# Patient Record
Sex: Male | Born: 1950 | Race: White | Hispanic: No | Marital: Married | Smoking: Never smoker
Health system: Southern US, Community
[De-identification: ages and names within clinical notes are randomized; demographics above are authoritative.]

## PROBLEM LIST (undated history)

## (undated) DIAGNOSIS — C61 Malignant neoplasm of prostate: Secondary | ICD-10-CM

## (undated) DIAGNOSIS — I2510 Atherosclerotic heart disease of native coronary artery without angina pectoris: Secondary | ICD-10-CM

## (undated) DIAGNOSIS — E785 Hyperlipidemia, unspecified: Secondary | ICD-10-CM

## (undated) DIAGNOSIS — R42 Dizziness and giddiness: Secondary | ICD-10-CM

## (undated) DIAGNOSIS — I10 Essential (primary) hypertension: Secondary | ICD-10-CM

## (undated) DIAGNOSIS — K219 Gastro-esophageal reflux disease without esophagitis: Secondary | ICD-10-CM

## (undated) DIAGNOSIS — K469 Unspecified abdominal hernia without obstruction or gangrene: Secondary | ICD-10-CM

## (undated) DIAGNOSIS — E039 Hypothyroidism, unspecified: Secondary | ICD-10-CM

## (undated) HISTORY — DX: Unspecified abdominal hernia without obstruction or gangrene: K46.9

## (undated) HISTORY — DX: Atherosclerotic heart disease of native coronary artery without angina pectoris: I25.10

## (undated) HISTORY — DX: Dizziness and giddiness: R42

## (undated) HISTORY — DX: Hyperlipidemia, unspecified: E78.5

## (undated) HISTORY — PX: CATARACT EXTRACTION: SUR2

## (undated) HISTORY — DX: Hypothyroidism, unspecified: E03.9

## (undated) HISTORY — DX: Essential (primary) hypertension: I10

## (undated) HISTORY — DX: Malignant neoplasm of prostate: C61

---

## 1998-06-28 ENCOUNTER — Ambulatory Visit (HOSPITAL_COMMUNITY): Admission: RE | Admit: 1998-06-28 | Discharge: 1998-06-28 | Payer: Self-pay | Admitting: Gastroenterology

## 1998-06-28 ENCOUNTER — Encounter (INDEPENDENT_AMBULATORY_CARE_PROVIDER_SITE_OTHER): Payer: Self-pay

## 2002-06-02 ENCOUNTER — Encounter: Payer: Self-pay | Admitting: Gastroenterology

## 2002-06-02 ENCOUNTER — Encounter: Admission: RE | Admit: 2002-06-02 | Discharge: 2002-06-02 | Payer: Self-pay | Admitting: Gastroenterology

## 2004-01-10 HISTORY — PX: PROSTATE SURGERY: SHX751

## 2004-01-20 ENCOUNTER — Ambulatory Visit (HOSPITAL_COMMUNITY): Admission: RE | Admit: 2004-01-20 | Discharge: 2004-01-20 | Payer: Self-pay | Admitting: Gastroenterology

## 2009-01-09 DIAGNOSIS — I2510 Atherosclerotic heart disease of native coronary artery without angina pectoris: Secondary | ICD-10-CM

## 2009-01-09 HISTORY — DX: Atherosclerotic heart disease of native coronary artery without angina pectoris: I25.10

## 2009-01-09 HISTORY — PX: CORONARY ARTERY BYPASS GRAFT: SHX141

## 2009-01-21 ENCOUNTER — Ambulatory Visit: Payer: Self-pay | Admitting: Surgery

## 2009-01-21 ENCOUNTER — Inpatient Hospital Stay (HOSPITAL_COMMUNITY): Admission: RE | Admit: 2009-01-21 | Discharge: 2009-01-27 | Payer: Self-pay | Admitting: Cardiology

## 2009-01-21 ENCOUNTER — Encounter: Payer: Self-pay | Admitting: Surgery

## 2009-02-16 ENCOUNTER — Ambulatory Visit: Payer: Self-pay | Admitting: Surgery

## 2009-02-16 ENCOUNTER — Encounter: Admission: RE | Admit: 2009-02-16 | Discharge: 2009-02-16 | Payer: Self-pay | Admitting: Surgery

## 2009-02-18 ENCOUNTER — Encounter (HOSPITAL_COMMUNITY): Admission: RE | Admit: 2009-02-18 | Discharge: 2009-05-19 | Payer: Self-pay | Admitting: Cardiology

## 2009-03-18 ENCOUNTER — Ambulatory Visit: Payer: Self-pay | Admitting: Cardiothoracic Surgery

## 2009-05-20 ENCOUNTER — Encounter (HOSPITAL_COMMUNITY): Admission: RE | Admit: 2009-05-20 | Discharge: 2009-05-28 | Payer: Self-pay | Admitting: Cardiology

## 2009-06-03 ENCOUNTER — Encounter (HOSPITAL_COMMUNITY): Admission: RE | Admit: 2009-06-03 | Discharge: 2009-09-01 | Payer: Self-pay | Admitting: Cardiology

## 2009-08-26 ENCOUNTER — Ambulatory Visit: Payer: Self-pay | Admitting: Cardiology

## 2009-08-26 ENCOUNTER — Observation Stay (HOSPITAL_COMMUNITY): Admission: EM | Admit: 2009-08-26 | Discharge: 2009-08-26 | Payer: Self-pay | Admitting: Emergency Medicine

## 2009-09-09 ENCOUNTER — Encounter (HOSPITAL_COMMUNITY)
Admission: RE | Admit: 2009-09-09 | Discharge: 2009-12-08 | Payer: Self-pay | Source: Home / Self Care | Admitting: Cardiology

## 2009-12-09 ENCOUNTER — Encounter (HOSPITAL_COMMUNITY)
Admission: RE | Admit: 2009-12-09 | Discharge: 2010-02-08 | Payer: Self-pay | Source: Home / Self Care | Attending: Cardiology | Admitting: Cardiology

## 2010-02-10 ENCOUNTER — Encounter (HOSPITAL_COMMUNITY): Payer: 59 | Attending: Cardiology

## 2010-02-10 DIAGNOSIS — K219 Gastro-esophageal reflux disease without esophagitis: Secondary | ICD-10-CM | POA: Insufficient documentation

## 2010-02-10 DIAGNOSIS — I2510 Atherosclerotic heart disease of native coronary artery without angina pectoris: Secondary | ICD-10-CM | POA: Insufficient documentation

## 2010-02-10 DIAGNOSIS — R002 Palpitations: Secondary | ICD-10-CM | POA: Insufficient documentation

## 2010-02-10 DIAGNOSIS — I10 Essential (primary) hypertension: Secondary | ICD-10-CM | POA: Insufficient documentation

## 2010-02-10 DIAGNOSIS — Z8249 Family history of ischemic heart disease and other diseases of the circulatory system: Secondary | ICD-10-CM | POA: Insufficient documentation

## 2010-02-10 DIAGNOSIS — Z5189 Encounter for other specified aftercare: Secondary | ICD-10-CM | POA: Insufficient documentation

## 2010-02-10 DIAGNOSIS — E039 Hypothyroidism, unspecified: Secondary | ICD-10-CM | POA: Insufficient documentation

## 2010-02-10 DIAGNOSIS — Z951 Presence of aortocoronary bypass graft: Secondary | ICD-10-CM | POA: Insufficient documentation

## 2010-02-10 DIAGNOSIS — Z8546 Personal history of malignant neoplasm of prostate: Secondary | ICD-10-CM | POA: Insufficient documentation

## 2010-02-10 DIAGNOSIS — E785 Hyperlipidemia, unspecified: Secondary | ICD-10-CM | POA: Insufficient documentation

## 2010-02-14 ENCOUNTER — Encounter (HOSPITAL_COMMUNITY): Payer: 59

## 2010-02-15 ENCOUNTER — Encounter (HOSPITAL_COMMUNITY): Payer: 59

## 2010-02-17 ENCOUNTER — Encounter (HOSPITAL_COMMUNITY): Payer: 59

## 2010-02-21 ENCOUNTER — Encounter (HOSPITAL_COMMUNITY): Payer: 59

## 2010-02-22 ENCOUNTER — Encounter (HOSPITAL_COMMUNITY): Payer: 59

## 2010-02-23 ENCOUNTER — Encounter (HOSPITAL_COMMUNITY): Payer: 59

## 2010-02-24 ENCOUNTER — Encounter (HOSPITAL_COMMUNITY): Payer: 59

## 2010-02-28 ENCOUNTER — Encounter (HOSPITAL_COMMUNITY): Payer: 59

## 2010-03-01 ENCOUNTER — Encounter (HOSPITAL_COMMUNITY): Payer: 59

## 2010-03-02 ENCOUNTER — Encounter (HOSPITAL_COMMUNITY): Payer: 59 | Attending: Cardiology

## 2010-03-03 ENCOUNTER — Encounter (HOSPITAL_COMMUNITY): Payer: 59

## 2010-03-07 ENCOUNTER — Encounter (HOSPITAL_COMMUNITY): Payer: 59

## 2010-03-08 ENCOUNTER — Encounter (HOSPITAL_COMMUNITY): Payer: 59

## 2010-03-10 ENCOUNTER — Encounter (HOSPITAL_COMMUNITY): Payer: 59 | Attending: Cardiology

## 2010-03-10 DIAGNOSIS — R002 Palpitations: Secondary | ICD-10-CM | POA: Insufficient documentation

## 2010-03-10 DIAGNOSIS — Z8249 Family history of ischemic heart disease and other diseases of the circulatory system: Secondary | ICD-10-CM | POA: Insufficient documentation

## 2010-03-10 DIAGNOSIS — E785 Hyperlipidemia, unspecified: Secondary | ICD-10-CM | POA: Insufficient documentation

## 2010-03-10 DIAGNOSIS — I10 Essential (primary) hypertension: Secondary | ICD-10-CM | POA: Insufficient documentation

## 2010-03-10 DIAGNOSIS — I2510 Atherosclerotic heart disease of native coronary artery without angina pectoris: Secondary | ICD-10-CM | POA: Insufficient documentation

## 2010-03-10 DIAGNOSIS — K219 Gastro-esophageal reflux disease without esophagitis: Secondary | ICD-10-CM | POA: Insufficient documentation

## 2010-03-10 DIAGNOSIS — Z8546 Personal history of malignant neoplasm of prostate: Secondary | ICD-10-CM | POA: Insufficient documentation

## 2010-03-10 DIAGNOSIS — Z5189 Encounter for other specified aftercare: Secondary | ICD-10-CM | POA: Insufficient documentation

## 2010-03-10 DIAGNOSIS — E039 Hypothyroidism, unspecified: Secondary | ICD-10-CM | POA: Insufficient documentation

## 2010-03-10 DIAGNOSIS — Z951 Presence of aortocoronary bypass graft: Secondary | ICD-10-CM | POA: Insufficient documentation

## 2010-03-14 ENCOUNTER — Encounter (HOSPITAL_COMMUNITY): Payer: 59

## 2010-03-15 ENCOUNTER — Ambulatory Visit (INDEPENDENT_AMBULATORY_CARE_PROVIDER_SITE_OTHER): Payer: 59 | Admitting: Surgery

## 2010-03-15 ENCOUNTER — Encounter (HOSPITAL_COMMUNITY): Payer: 59

## 2010-03-15 DIAGNOSIS — I2510 Atherosclerotic heart disease of native coronary artery without angina pectoris: Secondary | ICD-10-CM

## 2010-03-17 ENCOUNTER — Encounter (HOSPITAL_COMMUNITY): Payer: 59

## 2010-03-17 NOTE — Assessment & Plan Note (Signed)
OFFICE VISIT  ZOLTAN, GENEST DOB:  [birth year]                                        March 17, 2010 CHART #:  [id number]  The patient returned to my office today for examination of his chest incision status post coronary artery bypass graft surgery x2 on January 22, 2009.  He reports having 2 areas in the sternotomy incision.  They appeared discolored and he and his wife were concerned that there may be a retained suture.  He has had no fever or chills.  Otherwise, he has been doing well at home.  PHYSICAL EXAMINATION:  The chest incision is well healed.  The sternum is stable.  There are 2 darkened areas in the lower portion of the chest incision, which look like large blackheads.  These were probed with a pair of forceps and a large piece of dark sebaceous material was removed confirming that these were large pits in the skin with blackhead formation.  There is no sign of retained suture and I would not expect there would be since the incisions were closed with absorbable Vicryl suture.  IMPRESSION:  The patient does not have retained sutures but has 2 large blackheads in the incision.  This material was removed.  I instructed him to keep this area as clean as possible with soap and water.  This material may recur and he may need to manually remove it with a pair of tweezers if it does.  He understands all of this and is in agreement with that plan.  INDICATIONS:  This patient  Evelene Croon, M.D. Electronically Signed  BB/MEDQ  D:  03/17/2010  T:  03/17/2010  Job:  [id number]

## 2010-03-21 ENCOUNTER — Encounter (HOSPITAL_COMMUNITY): Payer: 59

## 2010-03-22 ENCOUNTER — Encounter (HOSPITAL_COMMUNITY): Payer: 59

## 2010-03-24 ENCOUNTER — Encounter (HOSPITAL_COMMUNITY): Payer: 59

## 2010-03-25 LAB — URINALYSIS, ROUTINE W REFLEX MICROSCOPIC
Glucose, UA: NEGATIVE mg/dL
Hgb urine dipstick: NEGATIVE
Nitrite: NEGATIVE
Protein, ur: NEGATIVE mg/dL
Urobilinogen, UA: 0.2 mg/dL (ref 0.0–1.0)

## 2010-03-25 LAB — CBC
Hemoglobin: 14.6 g/dL (ref 13.0–17.0)
MCH: 31.5 pg (ref 26.0–34.0)
RBC: 4.63 MIL/uL (ref 4.22–5.81)
WBC: 7.5 10*3/uL (ref 4.0–10.5)

## 2010-03-25 LAB — BASIC METABOLIC PANEL
Calcium: 9.4 mg/dL (ref 8.4–10.5)
Chloride: 110 mEq/L (ref 96–112)
Creatinine, Ser: 1.39 mg/dL (ref 0.4–1.5)
Sodium: 142 mEq/L (ref 135–145)

## 2010-03-25 LAB — POCT CARDIAC MARKERS

## 2010-03-25 LAB — DIFFERENTIAL
Eosinophils Relative: 1 % (ref 0–5)
Lymphocytes Relative: 46 % (ref 12–46)
Monocytes Absolute: 0.6 10*3/uL (ref 0.1–1.0)
Neutro Abs: 3.3 10*3/uL (ref 1.7–7.7)

## 2010-03-25 LAB — CK TOTAL AND CKMB (NOT AT ARMC): Total CK: 103 U/L (ref 7–232)

## 2010-03-25 LAB — TSH: TSH: 5.118 u[IU]/mL — ABNORMAL HIGH (ref 0.350–4.500)

## 2010-03-27 LAB — POCT I-STAT 4, (NA,K, GLUC, HGB,HCT)
Glucose, Bld: 105 mg/dL — ABNORMAL HIGH (ref 70–99)
Glucose, Bld: 98 mg/dL (ref 70–99)
HCT: 38 % — ABNORMAL LOW (ref 39.0–52.0)
Hemoglobin: 10.5 g/dL — ABNORMAL LOW (ref 13.0–17.0)
Hemoglobin: 12.9 g/dL — ABNORMAL LOW (ref 13.0–17.0)
Hemoglobin: 8.8 g/dL — ABNORMAL LOW (ref 13.0–17.0)
Potassium: 4.1 mEq/L (ref 3.5–5.1)
Potassium: 4.2 mEq/L (ref 3.5–5.1)
Sodium: 133 mEq/L — ABNORMAL LOW (ref 135–145)
Sodium: 138 mEq/L (ref 135–145)
Sodium: 139 mEq/L (ref 135–145)

## 2010-03-27 LAB — POCT I-STAT 3, VENOUS BLOOD GAS (G3P V)
Acid-base deficit: 2 mmol/L (ref 0.0–2.0)
Bicarbonate: 23.3 mEq/L (ref 20.0–24.0)
O2 Saturation: 76 %
pCO2, Ven: 42.2 mmHg — ABNORMAL LOW (ref 45.0–50.0)
pH, Ven: 7.35 — ABNORMAL HIGH (ref 7.250–7.300)

## 2010-03-27 LAB — POCT I-STAT 3, ART BLOOD GAS (G3+)
Bicarbonate: 21.3 mEq/L (ref 20.0–24.0)
Bicarbonate: 24.8 mEq/L — ABNORMAL HIGH (ref 20.0–24.0)
O2 Saturation: 100 %
O2 Saturation: 100 %
O2 Saturation: 99 %
Patient temperature: 34.9
TCO2: 26 mmol/L (ref 0–100)
pCO2 arterial: 36.5 mmHg (ref 35.0–45.0)
pCO2 arterial: 39 mmHg (ref 35.0–45.0)
pCO2 arterial: 40.1 mmHg (ref 35.0–45.0)
pH, Arterial: 7.341 — ABNORMAL LOW (ref 7.350–7.450)
pH, Arterial: 7.364 (ref 7.350–7.450)
pO2, Arterial: 253 mmHg — ABNORMAL HIGH (ref 80.0–100.0)
pO2, Arterial: 289 mmHg — ABNORMAL HIGH (ref 80.0–100.0)
pO2, Arterial: 81 mmHg (ref 80.0–100.0)

## 2010-03-27 LAB — BASIC METABOLIC PANEL
Calcium: 9 mg/dL (ref 8.4–10.5)
Chloride: 103 mEq/L (ref 96–112)
Chloride: 106 mEq/L (ref 96–112)
GFR calc Af Amer: >60 mL/min (ref >60)
GFR calc Af Amer: >60 mL/min (ref >60)
GFR calc non Af Amer: >60 mL/min (ref >60)
Potassium: 3.8 mEq/L (ref 3.5–5.1)
Potassium: 4.6 mEq/L (ref 3.5–5.1)
Sodium: 136 mEq/L (ref 135–145)

## 2010-03-27 LAB — PROTIME-INR
INR: 1.08 (ref 0.00–1.49)
INR: 1.47 (ref 0.00–1.49)
Prothrombin Time: 13.9 seconds (ref 11.6–15.2)
Prothrombin Time: 17.7 seconds — ABNORMAL HIGH (ref 11.6–15.2)

## 2010-03-27 LAB — CBC
HCT: 33.9 % — ABNORMAL LOW (ref 39.0–52.0)
HCT: 35.7 % — ABNORMAL LOW (ref 39.0–52.0)
HCT: 43.9 % (ref 39.0–52.0)
HCT: 44 % (ref 39.0–52.0)
Hemoglobin: 15.1 g/dL (ref 13.0–17.0)
MCHC: 34.7 g/dL (ref 30.0–36.0)
MCHC: 35 g/dL (ref 30.0–36.0)
MCV: 95 fL (ref 78.0–100.0)
MCV: 95.3 fL (ref 78.0–100.0)
MCV: 95.4 fL (ref 78.0–100.0)
MCV: 95.7 fL (ref 78.0–100.0)
MCV: 96 fL (ref 78.0–100.0)
Platelets: 124 10*3/uL — ABNORMAL LOW (ref 150–400)
Platelets: 169 10*3/uL (ref 150–400)
RBC: 3.56 MIL/uL — ABNORMAL LOW (ref 4.22–5.81)
RBC: 3.71 MIL/uL — ABNORMAL LOW (ref 4.22–5.81)
RBC: 4.59 MIL/uL (ref 4.22–5.81)
RBC: 4.62 MIL/uL (ref 4.22–5.81)
RDW: 12.7 % (ref 11.5–15.5)
WBC: 12.7 10*3/uL — ABNORMAL HIGH (ref 4.0–10.5)
WBC: 14.2 10*3/uL — ABNORMAL HIGH (ref 4.0–10.5)
WBC: 15.7 10*3/uL — ABNORMAL HIGH (ref 4.0–10.5)
WBC: 9.4 10*3/uL (ref 4.0–10.5)

## 2010-03-27 LAB — COMPREHENSIVE METABOLIC PANEL
BUN: 19 mg/dL (ref 6–23)
CO2: 28 mEq/L (ref 19–32)
Calcium: 8.7 mg/dL (ref 8.4–10.5)
Chloride: 101 mEq/L (ref 96–112)
GFR calc Af Amer: >60 mL/min (ref >60)
Glucose, Bld: 132 mg/dL — ABNORMAL HIGH (ref 70–99)
Total Bilirubin: 0.7 mg/dL (ref 0.3–1.2)
Total Protein: 6.9 g/dL (ref 6.0–8.3)

## 2010-03-27 LAB — GLUCOSE, CAPILLARY
Glucose-Capillary: 106 mg/dL — ABNORMAL HIGH (ref 70–99)
Glucose-Capillary: 88 mg/dL (ref 70–99)

## 2010-03-27 LAB — POCT I-STAT, CHEM 8
BUN: 17 mg/dL (ref 6–23)
BUN: 18 mg/dL (ref 6–23)
Calcium, Ion: 1.08 mmol/L — ABNORMAL LOW (ref 1.12–1.32)
Chloride: 105 mEq/L (ref 96–112)
Creatinine, Ser: 1.1 mg/dL (ref 0.4–1.5)
HCT: 35 % — ABNORMAL LOW (ref 39.0–52.0)
Sodium: 137 mEq/L (ref 135–145)
TCO2: 24 mmol/L (ref 0–100)
TCO2: 26 mmol/L (ref 0–100)

## 2010-03-27 LAB — TYPE AND SCREEN

## 2010-03-27 LAB — HEMOGLOBIN AND HEMATOCRIT, BLOOD: HCT: 29.5 % — ABNORMAL LOW (ref 39.0–52.0)

## 2010-03-27 LAB — LIPID PANEL
Cholesterol: 174 mg/dL (ref 0–200)
VLDL: 25 mg/dL (ref 0–40)

## 2010-03-27 LAB — CREATININE, SERUM
GFR calc Af Amer: >60 mL/min (ref >60)
GFR calc Af Amer: >60 mL/min (ref >60)

## 2010-03-27 LAB — URINALYSIS, ROUTINE W REFLEX MICROSCOPIC
Bilirubin Urine: NEGATIVE
Glucose, UA: NEGATIVE mg/dL
Hgb urine dipstick: NEGATIVE
Ketones, ur: NEGATIVE mg/dL
Protein, ur: NEGATIVE mg/dL
Specific Gravity, Urine: 1.02 (ref 1.005–1.030)
Urobilinogen, UA: 0.2 mg/dL (ref 0.0–1.0)

## 2010-03-27 LAB — APTT: aPTT: 27 seconds (ref 24–37)

## 2010-03-27 LAB — MAGNESIUM
Magnesium: 2.5 mg/dL (ref 1.5–2.5)
Magnesium: 2.8 mg/dL — ABNORMAL HIGH (ref 1.5–2.5)

## 2010-03-27 LAB — BLOOD GAS, ARTERIAL
Acid-Base Excess: 1.5 mmol/L (ref 0.0–2.0)
Bicarbonate: 25.5 mEq/L — ABNORMAL HIGH (ref 20.0–24.0)
TCO2: 26.7 mmol/L (ref 0–100)
pCO2 arterial: 39.3 mmHg (ref 35.0–45.0)

## 2010-03-28 ENCOUNTER — Encounter (HOSPITAL_COMMUNITY): Payer: 59

## 2010-03-28 LAB — CBC
HCT: 30.9 % — ABNORMAL LOW (ref 39.0–52.0)
Hemoglobin: 10.8 g/dL — ABNORMAL LOW (ref 13.0–17.0)
Hemoglobin: 11.2 g/dL — ABNORMAL LOW (ref 13.0–17.0)
MCHC: 34.8 g/dL (ref 30.0–36.0)
MCHC: 34.8 g/dL (ref 30.0–36.0)
MCV: 95.4 fL (ref 78.0–100.0)
RBC: 3.24 MIL/uL — ABNORMAL LOW (ref 4.22–5.81)
RBC: 3.36 MIL/uL — ABNORMAL LOW (ref 4.22–5.81)
WBC: 14.6 10*3/uL — ABNORMAL HIGH (ref 4.0–10.5)

## 2010-03-28 LAB — URINE MICROSCOPIC-ADD ON

## 2010-03-28 LAB — URINALYSIS, ROUTINE W REFLEX MICROSCOPIC
Bilirubin Urine: NEGATIVE
Glucose, UA: NEGATIVE mg/dL
Hgb urine dipstick: NEGATIVE
Ketones, ur: 15 mg/dL — AB
Protein, ur: 30 mg/dL — AB
pH: 7.5 (ref 5.0–8.0)

## 2010-03-28 LAB — BASIC METABOLIC PANEL
CO2: 27 mEq/L (ref 19–32)
Calcium: 7.5 mg/dL — ABNORMAL LOW (ref 8.4–10.5)
GFR calc Af Amer: >60 mL/min (ref >60)
Sodium: 133 mEq/L — ABNORMAL LOW (ref 135–145)

## 2010-03-29 ENCOUNTER — Encounter (HOSPITAL_COMMUNITY): Payer: 59

## 2010-03-31 ENCOUNTER — Encounter (HOSPITAL_COMMUNITY): Payer: 59

## 2010-04-04 ENCOUNTER — Encounter (HOSPITAL_COMMUNITY): Payer: 59

## 2010-04-05 ENCOUNTER — Encounter (HOSPITAL_COMMUNITY): Payer: 59

## 2010-04-07 ENCOUNTER — Encounter (HOSPITAL_COMMUNITY): Payer: 59

## 2010-04-11 ENCOUNTER — Encounter (HOSPITAL_COMMUNITY): Payer: Self-pay | Attending: Cardiology

## 2010-04-11 DIAGNOSIS — Z8546 Personal history of malignant neoplasm of prostate: Secondary | ICD-10-CM | POA: Insufficient documentation

## 2010-04-11 DIAGNOSIS — Z5189 Encounter for other specified aftercare: Secondary | ICD-10-CM | POA: Insufficient documentation

## 2010-04-11 DIAGNOSIS — E039 Hypothyroidism, unspecified: Secondary | ICD-10-CM | POA: Insufficient documentation

## 2010-04-11 DIAGNOSIS — Z951 Presence of aortocoronary bypass graft: Secondary | ICD-10-CM | POA: Insufficient documentation

## 2010-04-11 DIAGNOSIS — I2510 Atherosclerotic heart disease of native coronary artery without angina pectoris: Secondary | ICD-10-CM | POA: Insufficient documentation

## 2010-04-11 DIAGNOSIS — E785 Hyperlipidemia, unspecified: Secondary | ICD-10-CM | POA: Insufficient documentation

## 2010-04-11 DIAGNOSIS — Z8249 Family history of ischemic heart disease and other diseases of the circulatory system: Secondary | ICD-10-CM | POA: Insufficient documentation

## 2010-04-11 DIAGNOSIS — R002 Palpitations: Secondary | ICD-10-CM | POA: Insufficient documentation

## 2010-04-11 DIAGNOSIS — K219 Gastro-esophageal reflux disease without esophagitis: Secondary | ICD-10-CM | POA: Insufficient documentation

## 2010-04-11 DIAGNOSIS — I10 Essential (primary) hypertension: Secondary | ICD-10-CM | POA: Insufficient documentation

## 2010-04-12 ENCOUNTER — Encounter (HOSPITAL_COMMUNITY): Payer: Self-pay

## 2010-04-14 ENCOUNTER — Encounter (HOSPITAL_COMMUNITY): Payer: Self-pay

## 2010-04-18 ENCOUNTER — Encounter (HOSPITAL_COMMUNITY): Payer: Self-pay

## 2010-04-19 ENCOUNTER — Encounter (HOSPITAL_COMMUNITY): Payer: Self-pay

## 2010-04-21 ENCOUNTER — Encounter (HOSPITAL_COMMUNITY): Payer: Self-pay

## 2010-04-25 ENCOUNTER — Encounter (HOSPITAL_COMMUNITY): Payer: Self-pay

## 2010-04-26 ENCOUNTER — Encounter (HOSPITAL_COMMUNITY): Payer: Self-pay

## 2010-04-28 ENCOUNTER — Encounter (HOSPITAL_COMMUNITY): Payer: Self-pay

## 2010-05-02 ENCOUNTER — Encounter (HOSPITAL_COMMUNITY): Payer: Self-pay

## 2010-05-03 ENCOUNTER — Encounter (HOSPITAL_COMMUNITY): Payer: Self-pay

## 2010-05-05 ENCOUNTER — Encounter (HOSPITAL_COMMUNITY): Payer: Self-pay

## 2010-05-09 ENCOUNTER — Encounter (HOSPITAL_COMMUNITY): Payer: Self-pay

## 2010-05-10 ENCOUNTER — Encounter (HOSPITAL_COMMUNITY): Payer: Self-pay | Attending: Cardiology

## 2010-05-10 DIAGNOSIS — R002 Palpitations: Secondary | ICD-10-CM | POA: Insufficient documentation

## 2010-05-10 DIAGNOSIS — I10 Essential (primary) hypertension: Secondary | ICD-10-CM | POA: Insufficient documentation

## 2010-05-10 DIAGNOSIS — Z951 Presence of aortocoronary bypass graft: Secondary | ICD-10-CM | POA: Insufficient documentation

## 2010-05-10 DIAGNOSIS — E039 Hypothyroidism, unspecified: Secondary | ICD-10-CM | POA: Insufficient documentation

## 2010-05-10 DIAGNOSIS — E785 Hyperlipidemia, unspecified: Secondary | ICD-10-CM | POA: Insufficient documentation

## 2010-05-10 DIAGNOSIS — Z5189 Encounter for other specified aftercare: Secondary | ICD-10-CM | POA: Insufficient documentation

## 2010-05-10 DIAGNOSIS — Z8249 Family history of ischemic heart disease and other diseases of the circulatory system: Secondary | ICD-10-CM | POA: Insufficient documentation

## 2010-05-10 DIAGNOSIS — I2510 Atherosclerotic heart disease of native coronary artery without angina pectoris: Secondary | ICD-10-CM | POA: Insufficient documentation

## 2010-05-10 DIAGNOSIS — K219 Gastro-esophageal reflux disease without esophagitis: Secondary | ICD-10-CM | POA: Insufficient documentation

## 2010-05-10 DIAGNOSIS — Z8546 Personal history of malignant neoplasm of prostate: Secondary | ICD-10-CM | POA: Insufficient documentation

## 2010-05-12 ENCOUNTER — Encounter (HOSPITAL_COMMUNITY): Payer: Self-pay

## 2010-05-17 ENCOUNTER — Encounter (HOSPITAL_COMMUNITY): Payer: Self-pay

## 2010-05-18 ENCOUNTER — Encounter (HOSPITAL_COMMUNITY): Payer: Self-pay

## 2010-05-19 ENCOUNTER — Encounter (HOSPITAL_COMMUNITY): Payer: Self-pay

## 2010-05-23 ENCOUNTER — Encounter (HOSPITAL_COMMUNITY): Payer: Self-pay

## 2010-05-24 ENCOUNTER — Encounter (HOSPITAL_COMMUNITY): Payer: Self-pay

## 2010-05-24 NOTE — Assessment & Plan Note (Signed)
OFFICE VISIT   KASHMIR, LYSAGHT  DOB:  03-May-1950                                        February 16, 2009  CHART #:  [id number]   The patient returned to my office today for followup status post  coronary bypass graft surgery x2 on January 22, 2009.  He did have some  postoperative atrial fibrillation and went home on amiodarone and  Lopressor.  He said that after discharge he did have problems at home  with dizziness and his amiodarone and Lopressor were cut in half and he  has had no further problems with dizziness since then.  He has been  walking without chest pain or shortness of breath.   On physical examination, his blood pressure is 108/68, pulse is 78 and  regular, and respiratory rate is 18 unlabored.  Oxygen saturation on  room air is 96%.  He looks well.  Cardiac exam shows a regular rate and  rhythm with normal heart sounds.  His lung exam is clear.  Chest  incision is healing well and the sternum is stable.  His right leg  incision is healing well.  There is no lower extremity edema.   Followup chest x-ray today shows clear lung fields and no pleural  effusions.   His medications are aspirin 81 mg twice daily, Lopressor 25 mg b.i.d.,  Crestor 40 mg daily, amiodarone 200 mg daily, Synthroid 25 mcg daily,  and Tylenol p.r.n. pain.   IMPRESSION:  Overall, the patient is recovering well following his  surgery.  I told him he can return to driving a car at this time.  He  should refrain from lifting anything heavier than 10 pounds for a total  of 3 months from date of surgery.  I encouraged him to continue walking  as much as possible.  He has followup appointment in the next month with  Dr. Anne Fu and I think he  can probably get off his amiodarone at that time.  I told him he did not  need to return to see me unless he develops any problems with his  incisions.   Evelene Croon, M.D.  Electronically Signed   BB/MEDQ  D:  02/16/2009  T:   02/17/2009  Job:  [id number]   cc:   Jake Bathe, MD

## 2010-05-25 ENCOUNTER — Encounter (HOSPITAL_COMMUNITY): Payer: Self-pay

## 2010-05-27 NOTE — Op Note (Signed)
NAMEALPHONSE, Collin Morse                ACCOUNT NO.:  [id number]   MEDICAL RECORD NO.:  [id number]          PATIENT TYPE:  AMB   LOCATION:  ENDO                         FACILITY:  [facility]   PHYSICIAN:  John C. Madilyn Fireman, M.D.    DATE OF BIRTH:  May 29, 1950   DATE OF PROCEDURE:  01/20/2004  DATE OF DISCHARGE:                                 OPERATIVE REPORT   INDICATIONS FOR PROCEDURE:  History of adenomatous colon polyps.   PROCEDURE:  The patient was placed in the left lateral decubitus position  and placed on the pulse monitor with continuous low-flow oxygen delivered by  nasal cannula. He was sedated with 75 mcg IV fentanyl and 7 mg IV Versed.  The Olympus video colonoscope was inserted into the rectum and advanced to  the cecum, confirmed by transillumination by McBurney's point and  visualization of the ileocecal valve and appendiceal orifice. The prep was  excellent. The cecum, ascending, transverse, descending and sigmoid colon  all appeared normal with no masses, polyps, diverticula or other mucosal  abnormalities. The rectum likewise appeared normal, and retroflexed view of  the anus revealed no obvious internal hemorrhoids. Scope was then withdrawn  and the patient returned to the recovery room in stable condition.  He  tolerated the procedure well, and there were no immediate complications.   IMPRESSION:  Normal colonoscopy.   PLAN:  Repeat study in 5 years.      JCH/MEDQ  D:  01/20/2004  T:  01/20/2004  Job:  [id number]   cc:   Bertram Millard. Hyacinth Meeker, M.D.  [address]  [place]  [place] [address]  Fax: [phone]

## 2010-05-30 ENCOUNTER — Encounter (HOSPITAL_COMMUNITY): Payer: Self-pay

## 2010-05-31 ENCOUNTER — Encounter (HOSPITAL_COMMUNITY): Payer: Self-pay

## 2010-06-02 ENCOUNTER — Encounter (HOSPITAL_COMMUNITY): Payer: Self-pay

## 2010-06-07 ENCOUNTER — Encounter (HOSPITAL_COMMUNITY): Payer: Self-pay

## 2010-06-08 ENCOUNTER — Encounter (HOSPITAL_COMMUNITY): Payer: Self-pay

## 2010-06-09 ENCOUNTER — Encounter (HOSPITAL_COMMUNITY): Payer: Self-pay

## 2010-06-13 ENCOUNTER — Encounter (HOSPITAL_COMMUNITY): Payer: Self-pay | Attending: Cardiology

## 2010-06-13 DIAGNOSIS — Z8546 Personal history of malignant neoplasm of prostate: Secondary | ICD-10-CM | POA: Insufficient documentation

## 2010-06-13 DIAGNOSIS — Z8249 Family history of ischemic heart disease and other diseases of the circulatory system: Secondary | ICD-10-CM | POA: Insufficient documentation

## 2010-06-13 DIAGNOSIS — Z951 Presence of aortocoronary bypass graft: Secondary | ICD-10-CM | POA: Insufficient documentation

## 2010-06-13 DIAGNOSIS — E039 Hypothyroidism, unspecified: Secondary | ICD-10-CM | POA: Insufficient documentation

## 2010-06-13 DIAGNOSIS — Z5189 Encounter for other specified aftercare: Secondary | ICD-10-CM | POA: Insufficient documentation

## 2010-06-13 DIAGNOSIS — K219 Gastro-esophageal reflux disease without esophagitis: Secondary | ICD-10-CM | POA: Insufficient documentation

## 2010-06-13 DIAGNOSIS — I10 Essential (primary) hypertension: Secondary | ICD-10-CM | POA: Insufficient documentation

## 2010-06-13 DIAGNOSIS — E785 Hyperlipidemia, unspecified: Secondary | ICD-10-CM | POA: Insufficient documentation

## 2010-06-13 DIAGNOSIS — R002 Palpitations: Secondary | ICD-10-CM | POA: Insufficient documentation

## 2010-06-13 DIAGNOSIS — I2510 Atherosclerotic heart disease of native coronary artery without angina pectoris: Secondary | ICD-10-CM | POA: Insufficient documentation

## 2010-06-14 ENCOUNTER — Encounter (HOSPITAL_COMMUNITY): Payer: Self-pay

## 2010-06-16 ENCOUNTER — Encounter (HOSPITAL_COMMUNITY): Payer: Self-pay

## 2010-06-20 ENCOUNTER — Encounter (HOSPITAL_COMMUNITY): Payer: Self-pay

## 2010-06-21 ENCOUNTER — Encounter (HOSPITAL_COMMUNITY): Payer: Self-pay

## 2010-06-23 ENCOUNTER — Encounter (HOSPITAL_COMMUNITY): Payer: Self-pay

## 2010-06-27 ENCOUNTER — Encounter (HOSPITAL_COMMUNITY): Payer: Self-pay

## 2010-06-29 ENCOUNTER — Encounter (HOSPITAL_COMMUNITY): Payer: Self-pay

## 2010-06-30 ENCOUNTER — Encounter (HOSPITAL_COMMUNITY): Payer: Self-pay

## 2010-07-04 ENCOUNTER — Encounter (HOSPITAL_COMMUNITY): Payer: Self-pay

## 2010-07-05 ENCOUNTER — Encounter (HOSPITAL_COMMUNITY): Payer: Self-pay

## 2010-07-06 ENCOUNTER — Encounter (HOSPITAL_COMMUNITY): Payer: Self-pay

## 2010-07-07 ENCOUNTER — Encounter (HOSPITAL_COMMUNITY): Payer: Self-pay

## 2010-07-11 ENCOUNTER — Encounter (HOSPITAL_COMMUNITY): Payer: Self-pay | Attending: Cardiology

## 2010-07-11 DIAGNOSIS — Z8546 Personal history of malignant neoplasm of prostate: Secondary | ICD-10-CM | POA: Insufficient documentation

## 2010-07-11 DIAGNOSIS — I2510 Atherosclerotic heart disease of native coronary artery without angina pectoris: Secondary | ICD-10-CM | POA: Insufficient documentation

## 2010-07-11 DIAGNOSIS — R002 Palpitations: Secondary | ICD-10-CM | POA: Insufficient documentation

## 2010-07-11 DIAGNOSIS — E039 Hypothyroidism, unspecified: Secondary | ICD-10-CM | POA: Insufficient documentation

## 2010-07-11 DIAGNOSIS — Z8249 Family history of ischemic heart disease and other diseases of the circulatory system: Secondary | ICD-10-CM | POA: Insufficient documentation

## 2010-07-11 DIAGNOSIS — I10 Essential (primary) hypertension: Secondary | ICD-10-CM | POA: Insufficient documentation

## 2010-07-11 DIAGNOSIS — Z5189 Encounter for other specified aftercare: Secondary | ICD-10-CM | POA: Insufficient documentation

## 2010-07-11 DIAGNOSIS — K219 Gastro-esophageal reflux disease without esophagitis: Secondary | ICD-10-CM | POA: Insufficient documentation

## 2010-07-11 DIAGNOSIS — Z951 Presence of aortocoronary bypass graft: Secondary | ICD-10-CM | POA: Insufficient documentation

## 2010-07-11 DIAGNOSIS — E785 Hyperlipidemia, unspecified: Secondary | ICD-10-CM | POA: Insufficient documentation

## 2010-07-12 ENCOUNTER — Encounter (HOSPITAL_COMMUNITY): Payer: Self-pay

## 2010-07-14 ENCOUNTER — Encounter (HOSPITAL_COMMUNITY): Payer: Self-pay

## 2010-07-18 ENCOUNTER — Encounter (HOSPITAL_COMMUNITY): Payer: Self-pay

## 2010-07-19 ENCOUNTER — Encounter (HOSPITAL_COMMUNITY): Payer: Self-pay

## 2010-07-21 ENCOUNTER — Encounter (HOSPITAL_COMMUNITY): Payer: Self-pay

## 2010-07-25 ENCOUNTER — Encounter (HOSPITAL_COMMUNITY): Payer: Self-pay

## 2010-07-26 ENCOUNTER — Encounter (HOSPITAL_COMMUNITY): Payer: Self-pay

## 2010-07-28 ENCOUNTER — Encounter (HOSPITAL_COMMUNITY): Payer: Self-pay

## 2010-08-01 ENCOUNTER — Encounter (HOSPITAL_COMMUNITY): Payer: Self-pay

## 2010-08-02 ENCOUNTER — Encounter (HOSPITAL_COMMUNITY): Payer: Self-pay

## 2010-08-03 ENCOUNTER — Encounter (HOSPITAL_COMMUNITY): Payer: Self-pay

## 2010-08-04 ENCOUNTER — Encounter (HOSPITAL_COMMUNITY): Payer: Self-pay

## 2010-08-08 ENCOUNTER — Encounter (HOSPITAL_COMMUNITY): Payer: Self-pay

## 2010-08-09 ENCOUNTER — Encounter (HOSPITAL_COMMUNITY): Payer: Self-pay

## 2010-08-11 ENCOUNTER — Encounter (HOSPITAL_COMMUNITY): Payer: Self-pay | Attending: Cardiology

## 2010-08-11 DIAGNOSIS — Z8546 Personal history of malignant neoplasm of prostate: Secondary | ICD-10-CM | POA: Insufficient documentation

## 2010-08-11 DIAGNOSIS — Z5189 Encounter for other specified aftercare: Secondary | ICD-10-CM | POA: Insufficient documentation

## 2010-08-11 DIAGNOSIS — E039 Hypothyroidism, unspecified: Secondary | ICD-10-CM | POA: Insufficient documentation

## 2010-08-11 DIAGNOSIS — E785 Hyperlipidemia, unspecified: Secondary | ICD-10-CM | POA: Insufficient documentation

## 2010-08-11 DIAGNOSIS — I2510 Atherosclerotic heart disease of native coronary artery without angina pectoris: Secondary | ICD-10-CM | POA: Insufficient documentation

## 2010-08-11 DIAGNOSIS — K219 Gastro-esophageal reflux disease without esophagitis: Secondary | ICD-10-CM | POA: Insufficient documentation

## 2010-08-11 DIAGNOSIS — Z951 Presence of aortocoronary bypass graft: Secondary | ICD-10-CM | POA: Insufficient documentation

## 2010-08-11 DIAGNOSIS — I10 Essential (primary) hypertension: Secondary | ICD-10-CM | POA: Insufficient documentation

## 2010-08-11 DIAGNOSIS — Z8249 Family history of ischemic heart disease and other diseases of the circulatory system: Secondary | ICD-10-CM | POA: Insufficient documentation

## 2010-08-11 DIAGNOSIS — R002 Palpitations: Secondary | ICD-10-CM | POA: Insufficient documentation

## 2010-08-15 ENCOUNTER — Encounter (HOSPITAL_COMMUNITY): Payer: Self-pay

## 2010-08-16 ENCOUNTER — Encounter (HOSPITAL_COMMUNITY): Payer: Self-pay

## 2010-08-18 ENCOUNTER — Encounter (HOSPITAL_COMMUNITY): Payer: Self-pay

## 2010-08-22 ENCOUNTER — Encounter (HOSPITAL_COMMUNITY): Payer: Self-pay

## 2010-08-23 ENCOUNTER — Encounter (HOSPITAL_COMMUNITY): Payer: Self-pay

## 2010-08-25 ENCOUNTER — Encounter (HOSPITAL_COMMUNITY): Payer: Self-pay

## 2010-08-29 ENCOUNTER — Encounter (HOSPITAL_COMMUNITY): Payer: Self-pay

## 2010-08-30 ENCOUNTER — Encounter (HOSPITAL_COMMUNITY): Payer: Self-pay

## 2010-09-01 ENCOUNTER — Encounter (HOSPITAL_COMMUNITY): Payer: Self-pay

## 2010-09-05 ENCOUNTER — Encounter (HOSPITAL_COMMUNITY): Payer: Self-pay

## 2010-09-06 ENCOUNTER — Encounter (HOSPITAL_COMMUNITY): Payer: Self-pay

## 2010-09-08 ENCOUNTER — Encounter (HOSPITAL_COMMUNITY): Payer: Self-pay

## 2010-09-12 ENCOUNTER — Encounter (HOSPITAL_COMMUNITY): Payer: Self-pay | Attending: Cardiology

## 2010-09-12 DIAGNOSIS — Z5189 Encounter for other specified aftercare: Secondary | ICD-10-CM | POA: Insufficient documentation

## 2010-09-12 DIAGNOSIS — E039 Hypothyroidism, unspecified: Secondary | ICD-10-CM | POA: Insufficient documentation

## 2010-09-12 DIAGNOSIS — K219 Gastro-esophageal reflux disease without esophagitis: Secondary | ICD-10-CM | POA: Insufficient documentation

## 2010-09-12 DIAGNOSIS — Z951 Presence of aortocoronary bypass graft: Secondary | ICD-10-CM | POA: Insufficient documentation

## 2010-09-12 DIAGNOSIS — E785 Hyperlipidemia, unspecified: Secondary | ICD-10-CM | POA: Insufficient documentation

## 2010-09-12 DIAGNOSIS — R002 Palpitations: Secondary | ICD-10-CM | POA: Insufficient documentation

## 2010-09-12 DIAGNOSIS — Z8546 Personal history of malignant neoplasm of prostate: Secondary | ICD-10-CM | POA: Insufficient documentation

## 2010-09-12 DIAGNOSIS — Z8249 Family history of ischemic heart disease and other diseases of the circulatory system: Secondary | ICD-10-CM | POA: Insufficient documentation

## 2010-09-12 DIAGNOSIS — I10 Essential (primary) hypertension: Secondary | ICD-10-CM | POA: Insufficient documentation

## 2010-09-12 DIAGNOSIS — I2510 Atherosclerotic heart disease of native coronary artery without angina pectoris: Secondary | ICD-10-CM | POA: Insufficient documentation

## 2010-09-13 ENCOUNTER — Encounter (HOSPITAL_COMMUNITY): Payer: Self-pay

## 2010-09-14 ENCOUNTER — Encounter (HOSPITAL_COMMUNITY): Payer: Self-pay

## 2010-09-15 ENCOUNTER — Encounter (HOSPITAL_COMMUNITY): Payer: Self-pay

## 2010-09-19 ENCOUNTER — Encounter (HOSPITAL_COMMUNITY): Payer: Self-pay

## 2010-09-20 ENCOUNTER — Encounter (HOSPITAL_COMMUNITY): Payer: Self-pay

## 2010-09-22 ENCOUNTER — Encounter (HOSPITAL_COMMUNITY): Payer: Self-pay

## 2010-09-26 ENCOUNTER — Encounter (HOSPITAL_COMMUNITY): Payer: Self-pay

## 2010-09-27 ENCOUNTER — Encounter (HOSPITAL_COMMUNITY): Payer: Self-pay

## 2010-09-29 ENCOUNTER — Encounter (HOSPITAL_COMMUNITY): Payer: Self-pay

## 2010-10-03 ENCOUNTER — Encounter (HOSPITAL_COMMUNITY): Payer: Self-pay

## 2010-10-04 ENCOUNTER — Encounter (HOSPITAL_COMMUNITY): Payer: Self-pay

## 2010-10-06 ENCOUNTER — Encounter (HOSPITAL_COMMUNITY): Payer: Self-pay

## 2010-10-10 ENCOUNTER — Encounter (HOSPITAL_COMMUNITY): Payer: Self-pay | Attending: Cardiology

## 2010-10-10 DIAGNOSIS — E039 Hypothyroidism, unspecified: Secondary | ICD-10-CM | POA: Insufficient documentation

## 2010-10-10 DIAGNOSIS — I2510 Atherosclerotic heart disease of native coronary artery without angina pectoris: Secondary | ICD-10-CM | POA: Insufficient documentation

## 2010-10-10 DIAGNOSIS — Z8546 Personal history of malignant neoplasm of prostate: Secondary | ICD-10-CM | POA: Insufficient documentation

## 2010-10-10 DIAGNOSIS — I10 Essential (primary) hypertension: Secondary | ICD-10-CM | POA: Insufficient documentation

## 2010-10-10 DIAGNOSIS — Z5189 Encounter for other specified aftercare: Secondary | ICD-10-CM | POA: Insufficient documentation

## 2010-10-10 DIAGNOSIS — K219 Gastro-esophageal reflux disease without esophagitis: Secondary | ICD-10-CM | POA: Insufficient documentation

## 2010-10-10 DIAGNOSIS — R002 Palpitations: Secondary | ICD-10-CM | POA: Insufficient documentation

## 2010-10-10 DIAGNOSIS — E785 Hyperlipidemia, unspecified: Secondary | ICD-10-CM | POA: Insufficient documentation

## 2010-10-10 DIAGNOSIS — Z951 Presence of aortocoronary bypass graft: Secondary | ICD-10-CM | POA: Insufficient documentation

## 2010-10-10 DIAGNOSIS — Z8249 Family history of ischemic heart disease and other diseases of the circulatory system: Secondary | ICD-10-CM | POA: Insufficient documentation

## 2010-10-11 ENCOUNTER — Encounter (HOSPITAL_COMMUNITY): Payer: Self-pay

## 2010-10-13 ENCOUNTER — Encounter (HOSPITAL_COMMUNITY): Payer: Self-pay

## 2010-10-17 ENCOUNTER — Encounter (HOSPITAL_COMMUNITY): Payer: Self-pay

## 2010-10-18 ENCOUNTER — Encounter (HOSPITAL_COMMUNITY): Payer: Self-pay

## 2010-10-20 ENCOUNTER — Encounter (HOSPITAL_COMMUNITY): Payer: Self-pay

## 2010-10-24 ENCOUNTER — Encounter (HOSPITAL_COMMUNITY): Payer: Self-pay

## 2010-10-25 ENCOUNTER — Encounter (HOSPITAL_COMMUNITY): Payer: Self-pay

## 2010-10-27 ENCOUNTER — Encounter (HOSPITAL_COMMUNITY): Payer: Self-pay

## 2010-10-28 ENCOUNTER — Encounter (HOSPITAL_COMMUNITY): Payer: Self-pay

## 2010-10-31 ENCOUNTER — Encounter (HOSPITAL_COMMUNITY): Payer: Self-pay

## 2010-11-01 ENCOUNTER — Encounter (HOSPITAL_COMMUNITY): Payer: Self-pay

## 2010-11-03 ENCOUNTER — Encounter (HOSPITAL_COMMUNITY): Payer: Self-pay

## 2010-11-07 ENCOUNTER — Encounter (HOSPITAL_COMMUNITY): Payer: Self-pay

## 2010-11-08 ENCOUNTER — Encounter (HOSPITAL_COMMUNITY): Payer: Self-pay

## 2010-11-10 ENCOUNTER — Encounter (HOSPITAL_COMMUNITY): Payer: Self-pay

## 2010-11-10 DIAGNOSIS — Z951 Presence of aortocoronary bypass graft: Secondary | ICD-10-CM | POA: Insufficient documentation

## 2010-11-10 DIAGNOSIS — K219 Gastro-esophageal reflux disease without esophagitis: Secondary | ICD-10-CM | POA: Insufficient documentation

## 2010-11-10 DIAGNOSIS — E039 Hypothyroidism, unspecified: Secondary | ICD-10-CM | POA: Insufficient documentation

## 2010-11-10 DIAGNOSIS — Z5189 Encounter for other specified aftercare: Secondary | ICD-10-CM | POA: Insufficient documentation

## 2010-11-10 DIAGNOSIS — R002 Palpitations: Secondary | ICD-10-CM | POA: Insufficient documentation

## 2010-11-10 DIAGNOSIS — E785 Hyperlipidemia, unspecified: Secondary | ICD-10-CM | POA: Insufficient documentation

## 2010-11-10 DIAGNOSIS — I2510 Atherosclerotic heart disease of native coronary artery without angina pectoris: Secondary | ICD-10-CM | POA: Insufficient documentation

## 2010-11-10 DIAGNOSIS — Z8249 Family history of ischemic heart disease and other diseases of the circulatory system: Secondary | ICD-10-CM | POA: Insufficient documentation

## 2010-11-10 DIAGNOSIS — Z8546 Personal history of malignant neoplasm of prostate: Secondary | ICD-10-CM | POA: Insufficient documentation

## 2010-11-10 DIAGNOSIS — I10 Essential (primary) hypertension: Secondary | ICD-10-CM | POA: Insufficient documentation

## 2010-11-14 ENCOUNTER — Encounter (HOSPITAL_COMMUNITY): Payer: Self-pay

## 2010-11-15 ENCOUNTER — Encounter (HOSPITAL_COMMUNITY): Payer: Self-pay

## 2010-11-17 ENCOUNTER — Encounter (HOSPITAL_COMMUNITY): Payer: Self-pay

## 2010-11-21 ENCOUNTER — Encounter (HOSPITAL_COMMUNITY): Payer: Self-pay

## 2010-11-22 ENCOUNTER — Encounter (HOSPITAL_COMMUNITY): Payer: Self-pay

## 2010-11-24 ENCOUNTER — Encounter (HOSPITAL_COMMUNITY): Payer: Self-pay

## 2010-11-28 ENCOUNTER — Encounter (HOSPITAL_COMMUNITY)
Admission: RE | Admit: 2010-11-28 | Discharge: 2010-11-28 | Disposition: A | Discharge status: Home / Self Care | Payer: Self-pay | Source: Ambulatory Visit | Attending: Cardiology | Admitting: Cardiology

## 2010-11-29 ENCOUNTER — Encounter (HOSPITAL_COMMUNITY)
Admission: RE | Admit: 2010-11-29 | Discharge: 2010-11-29 | Disposition: A | Discharge status: Home / Self Care | Payer: Self-pay | Source: Ambulatory Visit | Attending: Cardiology | Admitting: Cardiology

## 2010-12-01 ENCOUNTER — Encounter (HOSPITAL_COMMUNITY): Payer: Self-pay

## 2010-12-05 ENCOUNTER — Encounter (HOSPITAL_COMMUNITY)
Admission: RE | Admit: 2010-12-05 | Discharge: 2010-12-05 | Disposition: A | Discharge status: Home / Self Care | Payer: Self-pay | Source: Ambulatory Visit | Attending: Cardiology | Admitting: Cardiology

## 2010-12-06 ENCOUNTER — Encounter (HOSPITAL_COMMUNITY)
Admission: RE | Admit: 2010-12-06 | Discharge: 2010-12-06 | Disposition: A | Discharge status: Home / Self Care | Payer: Self-pay | Source: Ambulatory Visit | Attending: Cardiology | Admitting: Cardiology

## 2010-12-08 ENCOUNTER — Encounter (HOSPITAL_COMMUNITY)
Admission: RE | Admit: 2010-12-08 | Discharge: 2010-12-08 | Disposition: A | Discharge status: Home / Self Care | Payer: Self-pay | Source: Ambulatory Visit | Attending: Cardiology | Admitting: Cardiology

## 2010-12-12 ENCOUNTER — Encounter (HOSPITAL_COMMUNITY)
Admission: RE | Admit: 2010-12-12 | Discharge: 2010-12-12 | Disposition: A | Discharge status: Home / Self Care | Payer: Self-pay | Source: Ambulatory Visit | Attending: Cardiology | Admitting: Cardiology

## 2010-12-12 DIAGNOSIS — R002 Palpitations: Secondary | ICD-10-CM | POA: Insufficient documentation

## 2010-12-12 DIAGNOSIS — Z951 Presence of aortocoronary bypass graft: Secondary | ICD-10-CM | POA: Insufficient documentation

## 2010-12-12 DIAGNOSIS — E785 Hyperlipidemia, unspecified: Secondary | ICD-10-CM | POA: Insufficient documentation

## 2010-12-12 DIAGNOSIS — Z8546 Personal history of malignant neoplasm of prostate: Secondary | ICD-10-CM | POA: Insufficient documentation

## 2010-12-12 DIAGNOSIS — K219 Gastro-esophageal reflux disease without esophagitis: Secondary | ICD-10-CM | POA: Insufficient documentation

## 2010-12-12 DIAGNOSIS — Z8249 Family history of ischemic heart disease and other diseases of the circulatory system: Secondary | ICD-10-CM | POA: Insufficient documentation

## 2010-12-12 DIAGNOSIS — Z5189 Encounter for other specified aftercare: Secondary | ICD-10-CM | POA: Insufficient documentation

## 2010-12-12 DIAGNOSIS — E039 Hypothyroidism, unspecified: Secondary | ICD-10-CM | POA: Insufficient documentation

## 2010-12-12 DIAGNOSIS — I10 Essential (primary) hypertension: Secondary | ICD-10-CM | POA: Insufficient documentation

## 2010-12-12 DIAGNOSIS — I2510 Atherosclerotic heart disease of native coronary artery without angina pectoris: Secondary | ICD-10-CM | POA: Insufficient documentation

## 2010-12-13 ENCOUNTER — Encounter (HOSPITAL_COMMUNITY)
Admission: RE | Admit: 2010-12-13 | Discharge: 2010-12-13 | Disposition: A | Discharge status: Home / Self Care | Payer: Self-pay | Source: Ambulatory Visit | Attending: Cardiology | Admitting: Cardiology

## 2010-12-14 ENCOUNTER — Encounter (HOSPITAL_COMMUNITY)
Admission: RE | Admit: 2010-12-14 | Discharge: 2010-12-14 | Disposition: A | Discharge status: Home / Self Care | Payer: Self-pay | Source: Ambulatory Visit | Attending: Cardiology | Admitting: Cardiology

## 2010-12-15 ENCOUNTER — Encounter (HOSPITAL_COMMUNITY): Payer: Self-pay

## 2010-12-19 ENCOUNTER — Encounter (HOSPITAL_COMMUNITY)
Admission: RE | Admit: 2010-12-19 | Discharge: 2010-12-19 | Disposition: A | Discharge status: Home / Self Care | Payer: Self-pay | Source: Ambulatory Visit | Attending: Cardiology | Admitting: Cardiology

## 2010-12-20 ENCOUNTER — Encounter (HOSPITAL_COMMUNITY)
Admission: RE | Admit: 2010-12-20 | Discharge: 2010-12-20 | Disposition: A | Discharge status: Home / Self Care | Payer: Self-pay | Source: Ambulatory Visit | Attending: Cardiology | Admitting: Cardiology

## 2010-12-22 ENCOUNTER — Encounter (HOSPITAL_COMMUNITY)
Admission: RE | Admit: 2010-12-22 | Discharge: 2010-12-22 | Disposition: A | Discharge status: Home / Self Care | Payer: Self-pay | Source: Ambulatory Visit | Attending: Cardiology | Admitting: Cardiology

## 2010-12-26 ENCOUNTER — Encounter (HOSPITAL_COMMUNITY)
Admission: RE | Admit: 2010-12-26 | Discharge: 2010-12-26 | Disposition: A | Discharge status: Home / Self Care | Payer: Self-pay | Source: Ambulatory Visit | Attending: Cardiology | Admitting: Cardiology

## 2010-12-27 ENCOUNTER — Encounter (HOSPITAL_COMMUNITY)
Admission: RE | Admit: 2010-12-27 | Discharge: 2010-12-27 | Disposition: A | Discharge status: Home / Self Care | Payer: Self-pay | Source: Ambulatory Visit | Attending: Cardiology | Admitting: Cardiology

## 2010-12-29 ENCOUNTER — Encounter (HOSPITAL_COMMUNITY)
Admission: RE | Admit: 2010-12-29 | Discharge: 2010-12-29 | Disposition: A | Discharge status: Home / Self Care | Payer: Self-pay | Source: Ambulatory Visit | Attending: Cardiology | Admitting: Cardiology

## 2011-01-02 ENCOUNTER — Encounter (HOSPITAL_COMMUNITY): Payer: Self-pay

## 2011-01-03 ENCOUNTER — Encounter (HOSPITAL_COMMUNITY): Payer: Self-pay

## 2011-01-05 ENCOUNTER — Encounter (HOSPITAL_COMMUNITY): Payer: Self-pay

## 2011-01-09 ENCOUNTER — Encounter (HOSPITAL_COMMUNITY): Payer: Self-pay

## 2011-01-09 IMAGING — CR DG CHEST 2V
2 series · 2 of 2 positions shown · non-contrast
Comparison: None available.

CLINICAL DATA: Shortness of breath.  Preop for CABG 01/22/2009.

CHEST - 2 VIEW

[w chest pa]
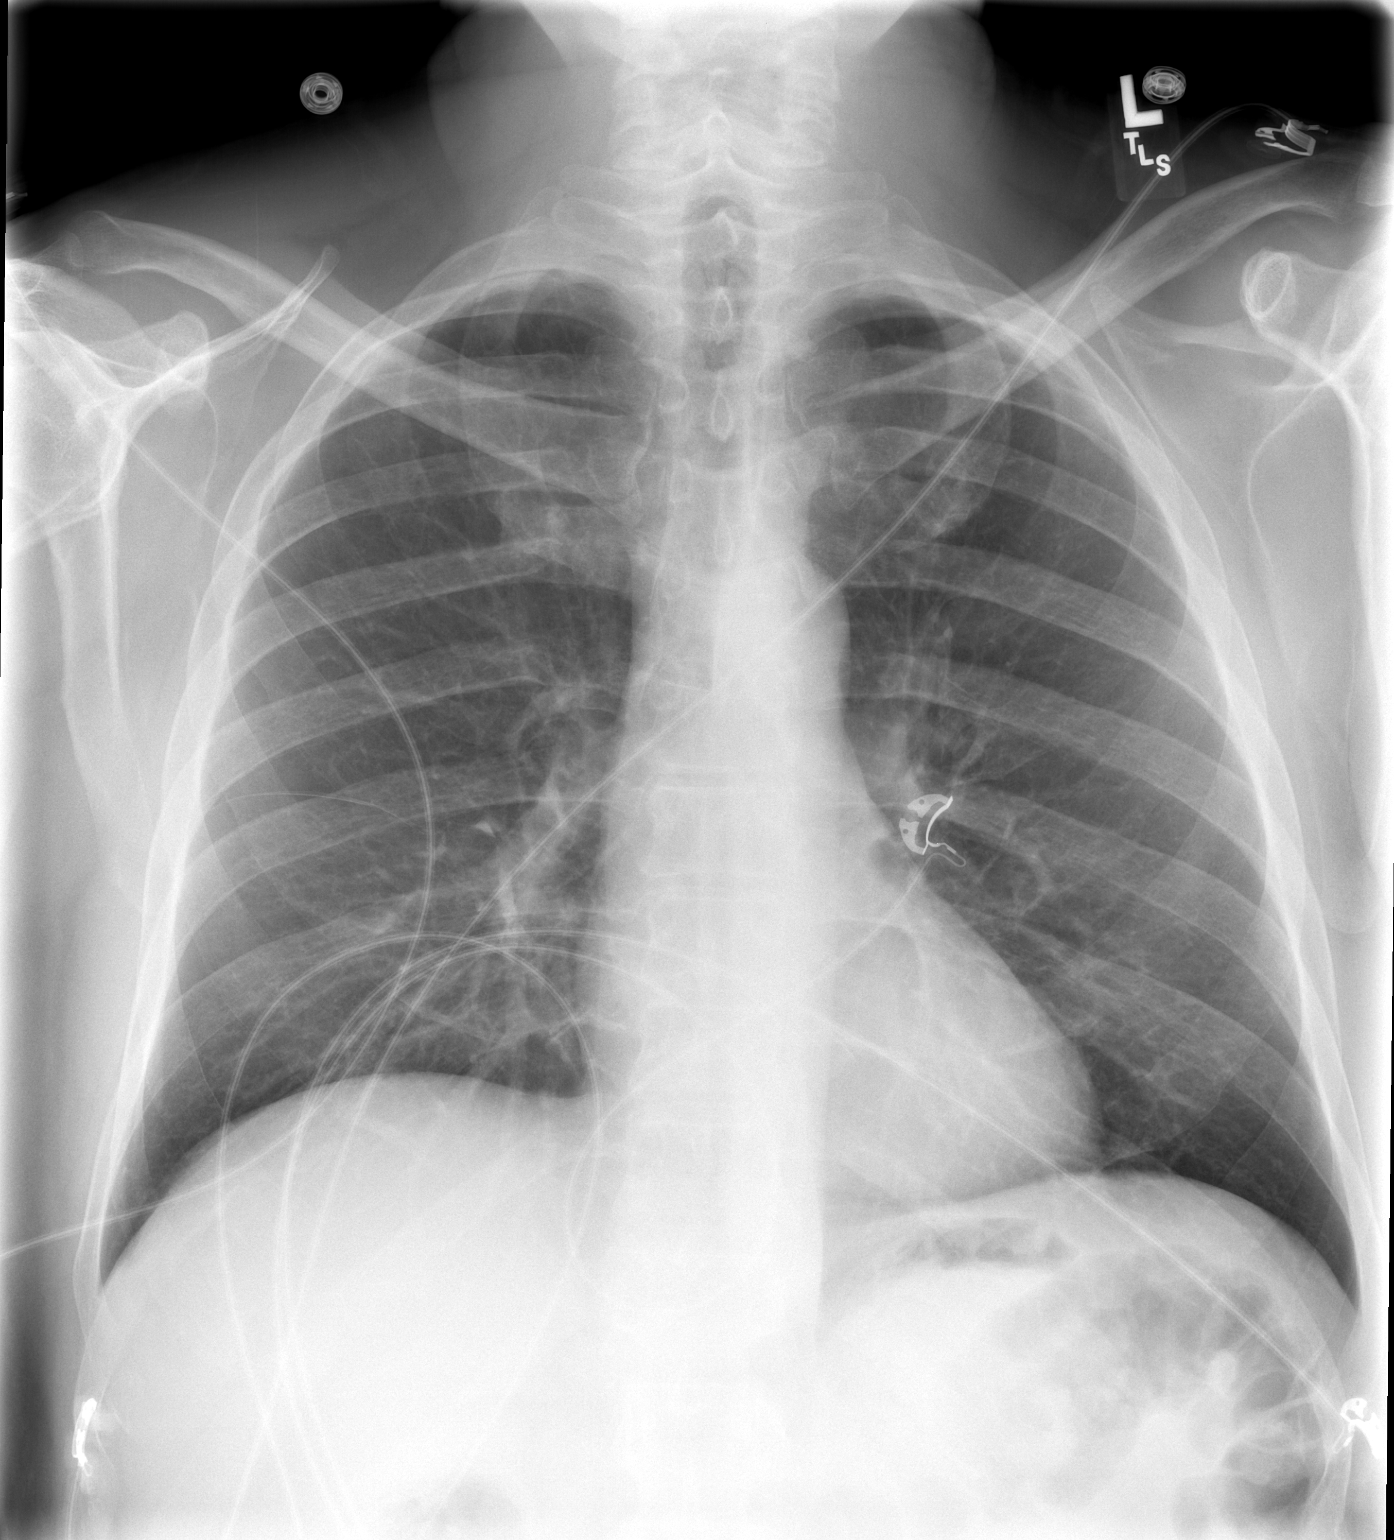

[w chest lat *]
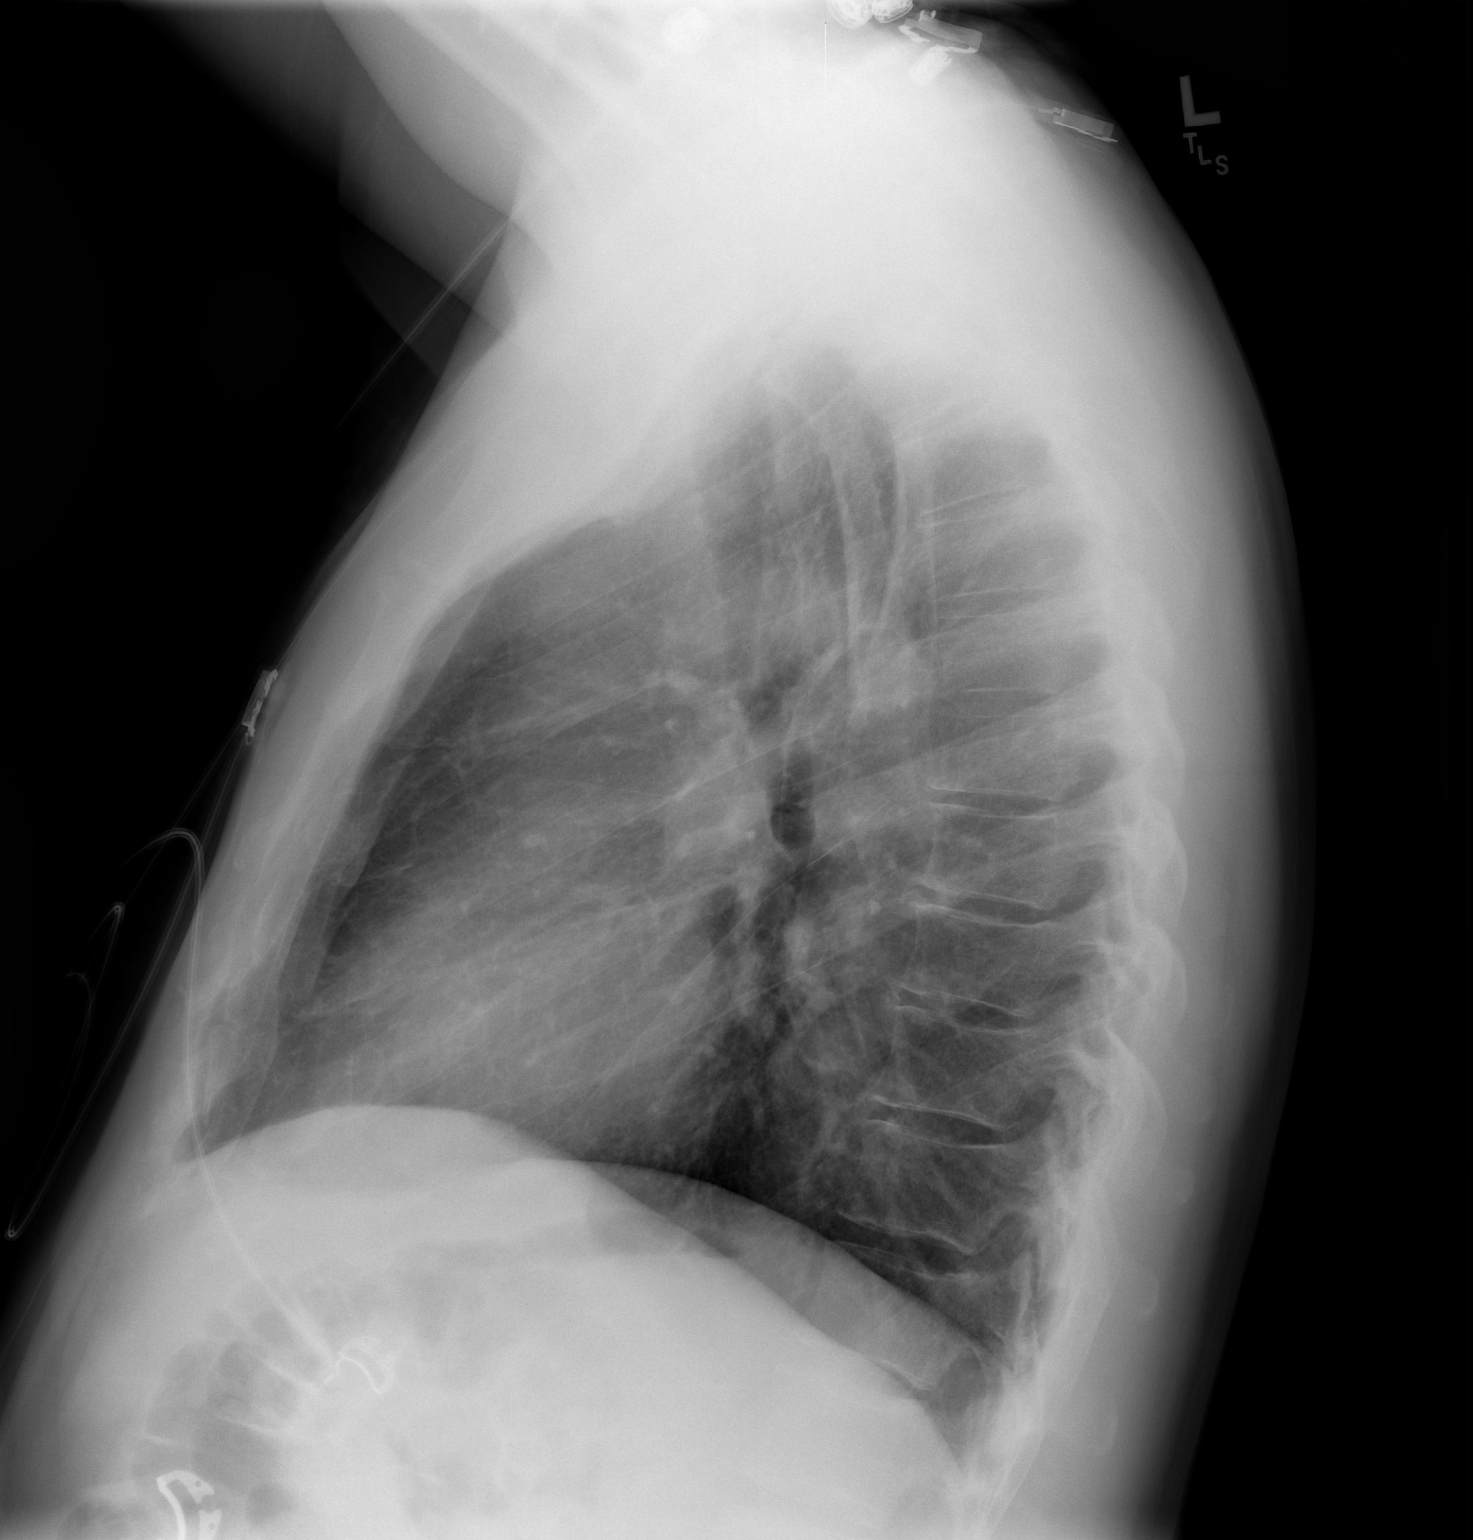

[2 of 2 positions shown; findings below may reference images not displayed]

FINDINGS: The cardiopericardial silhouette is within normal limits
for size.  The lungs are clear.  The visualized soft tissues and
bony thorax are unremarkable.
IMPRESSION: No acute cardiopulmonary disease.

## 2011-01-10 ENCOUNTER — Encounter (HOSPITAL_COMMUNITY): Payer: Self-pay

## 2011-01-10 IMAGING — CR DG CHEST 1V PORT
1 series · 1 of 1 positions shown · non-contrast
Comparison: 01/21/2009

CLINICAL DATA: Bypass surgery.

PORTABLE CHEST - 1 VIEW

[view not recorded]
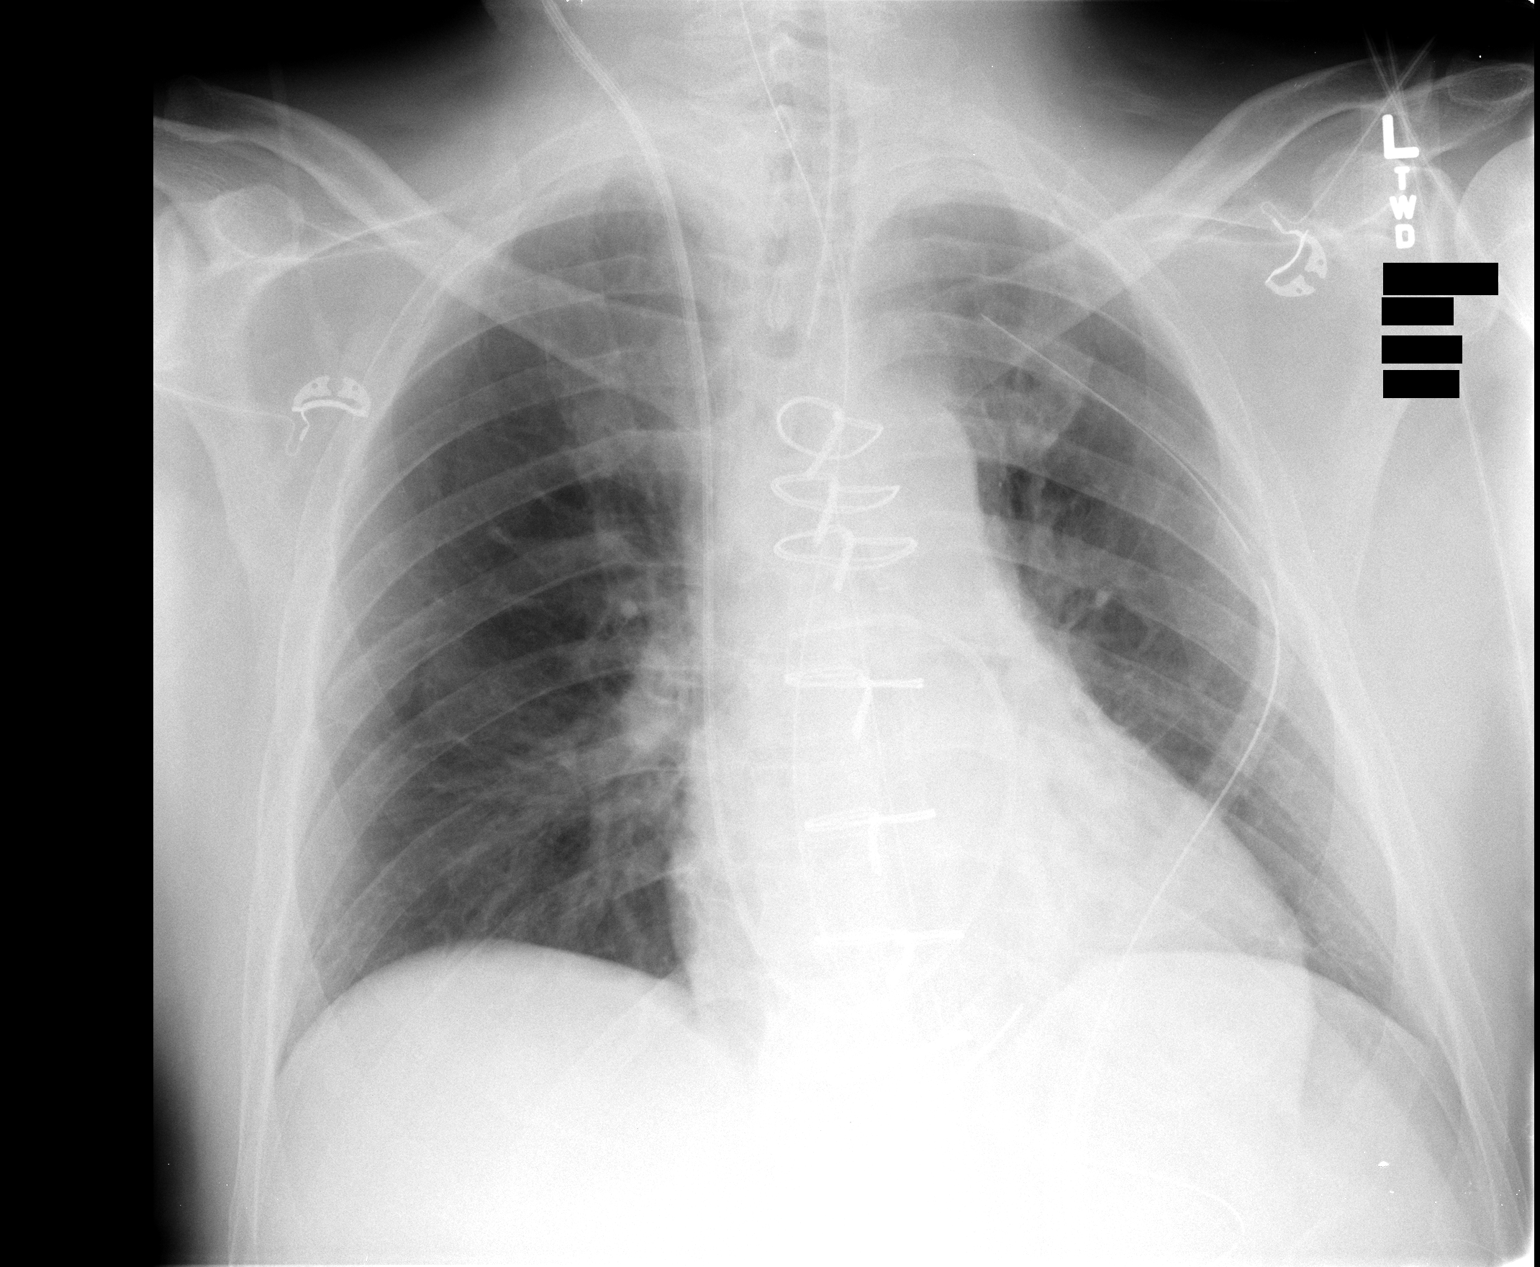

[1 of 1 positions shown; findings below may reference images not displayed]

FINDINGS: The endotracheal tube is in good position approximately
5.5 cm above the carina.  The NG tube is in the stomach.  The left-
sided chest tube is in good position.  No pneumothorax.  The Swan-
Ganz catheter tip is in the proximal right pulmonary artery.

The heart is normal in size.  The lungs are clear.
IMPRESSION: 1.  Postoperative support apparatus in good position without
complicating features.
2.  No acute cardiopulmonary findings.

## 2011-01-11 IMAGING — CR DG CHEST 1V PORT
1 series · 1 of 1 positions shown · non-contrast
Comparison: 01/22/2009

CLINICAL DATA: CABG

PORTABLE CHEST - 1 VIEW

[view not recorded]
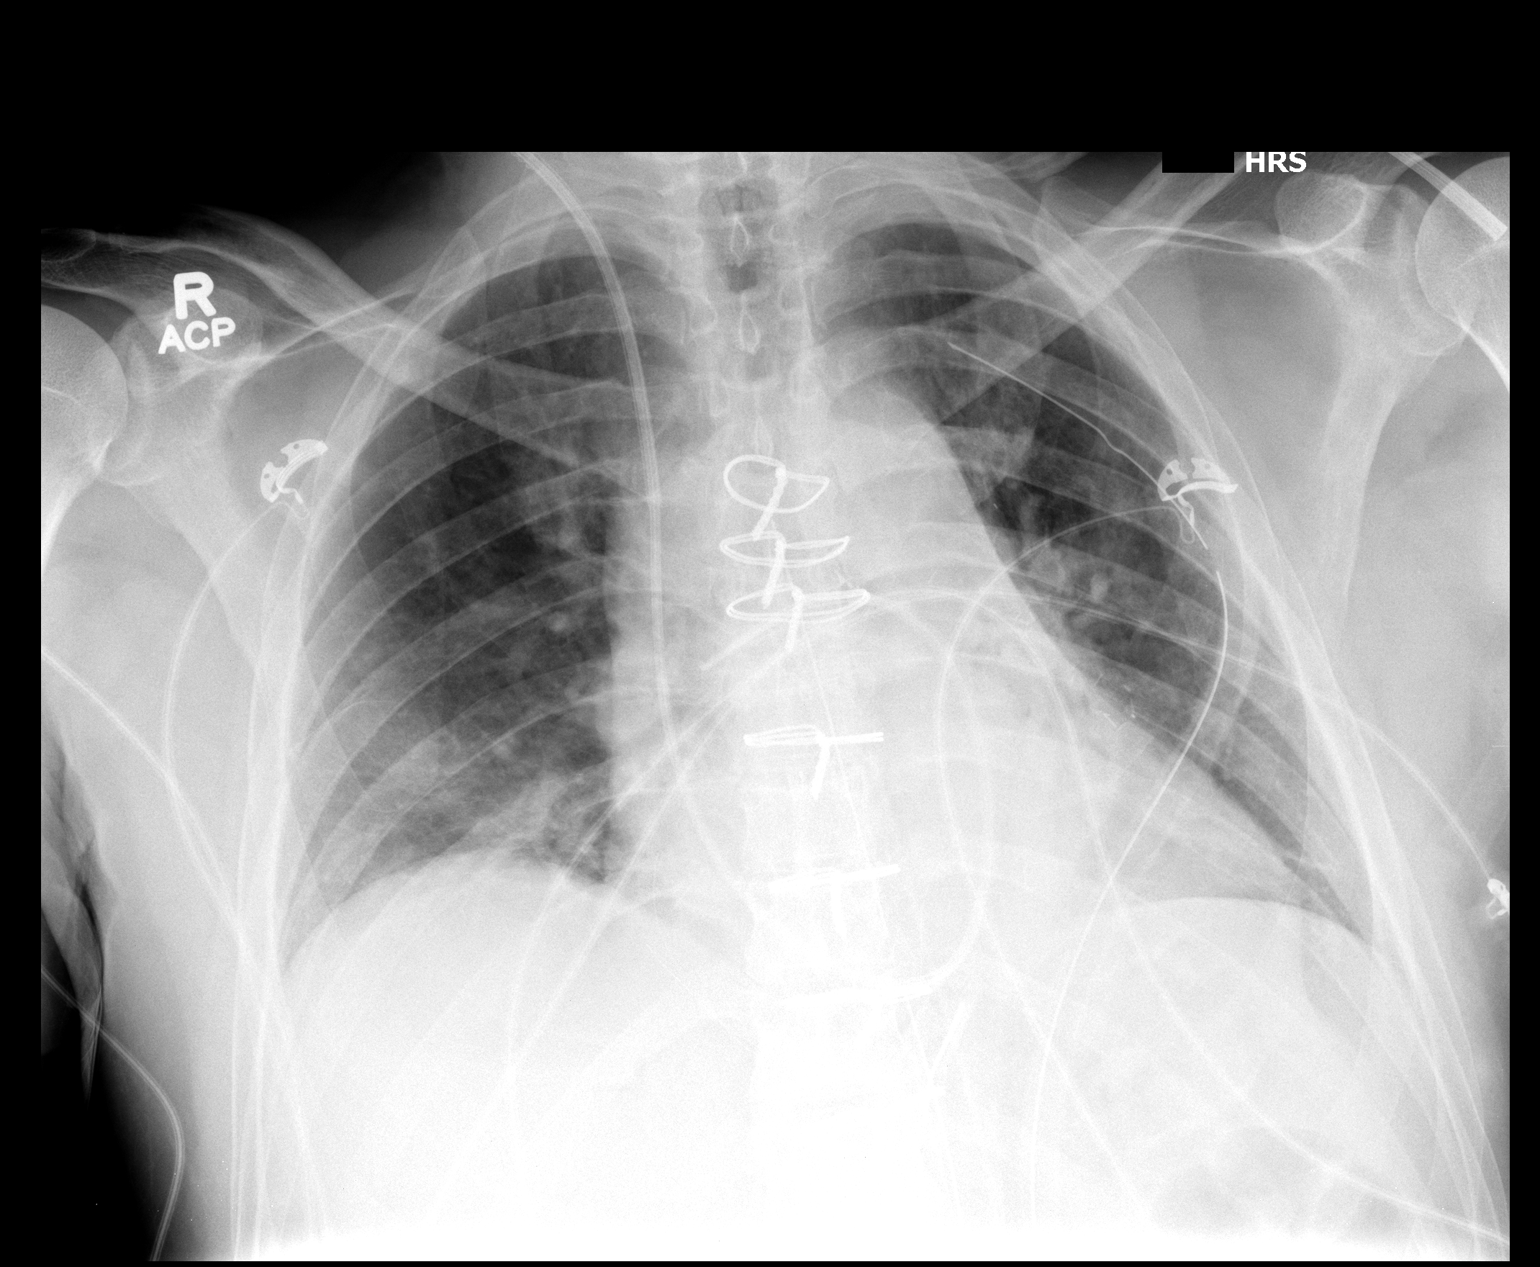

[1 of 1 positions shown; findings below may reference images not displayed]

FINDINGS: Patient is status post median sternotomy.  Nasogastric
and endotracheal tubes have been removed.  A left chest tube is
unchanged in position, with tip near the lung apex.  A right IJ
sheath transmits a Swan-Ganz catheter, with the tip in the right
main pulmonary artery. Two mediastinal drains appears stable Heart
size is stable.  Lung volumes are low or compared to 01/23/2008.  A
new right basilar opacity is present.  Pulmonary vascularity is
upper normal.  No edema appreciated.
IMPRESSION: Lower lung volumes with a developing right basilar opacity.   The
opacity could reflect atelectasis or early airspace disease.

## 2011-01-12 ENCOUNTER — Encounter (HOSPITAL_COMMUNITY): Payer: Self-pay

## 2011-01-12 DIAGNOSIS — I2510 Atherosclerotic heart disease of native coronary artery without angina pectoris: Secondary | ICD-10-CM | POA: Insufficient documentation

## 2011-01-12 DIAGNOSIS — Z951 Presence of aortocoronary bypass graft: Secondary | ICD-10-CM | POA: Insufficient documentation

## 2011-01-12 DIAGNOSIS — K219 Gastro-esophageal reflux disease without esophagitis: Secondary | ICD-10-CM | POA: Insufficient documentation

## 2011-01-12 DIAGNOSIS — E785 Hyperlipidemia, unspecified: Secondary | ICD-10-CM | POA: Insufficient documentation

## 2011-01-12 DIAGNOSIS — E039 Hypothyroidism, unspecified: Secondary | ICD-10-CM | POA: Insufficient documentation

## 2011-01-12 DIAGNOSIS — Z8546 Personal history of malignant neoplasm of prostate: Secondary | ICD-10-CM | POA: Insufficient documentation

## 2011-01-12 DIAGNOSIS — R002 Palpitations: Secondary | ICD-10-CM | POA: Insufficient documentation

## 2011-01-12 DIAGNOSIS — I10 Essential (primary) hypertension: Secondary | ICD-10-CM | POA: Insufficient documentation

## 2011-01-12 DIAGNOSIS — Z5189 Encounter for other specified aftercare: Secondary | ICD-10-CM | POA: Insufficient documentation

## 2011-01-12 DIAGNOSIS — Z8249 Family history of ischemic heart disease and other diseases of the circulatory system: Secondary | ICD-10-CM | POA: Insufficient documentation

## 2011-01-12 IMAGING — CR DG CHEST 1V PORT
1 series · 1 of 1 positions shown · non-contrast
Comparison: 01/23/2009

CLINICAL DATA: Status post CABG

PORTABLE CHEST - 1 VIEW

[view not recorded]
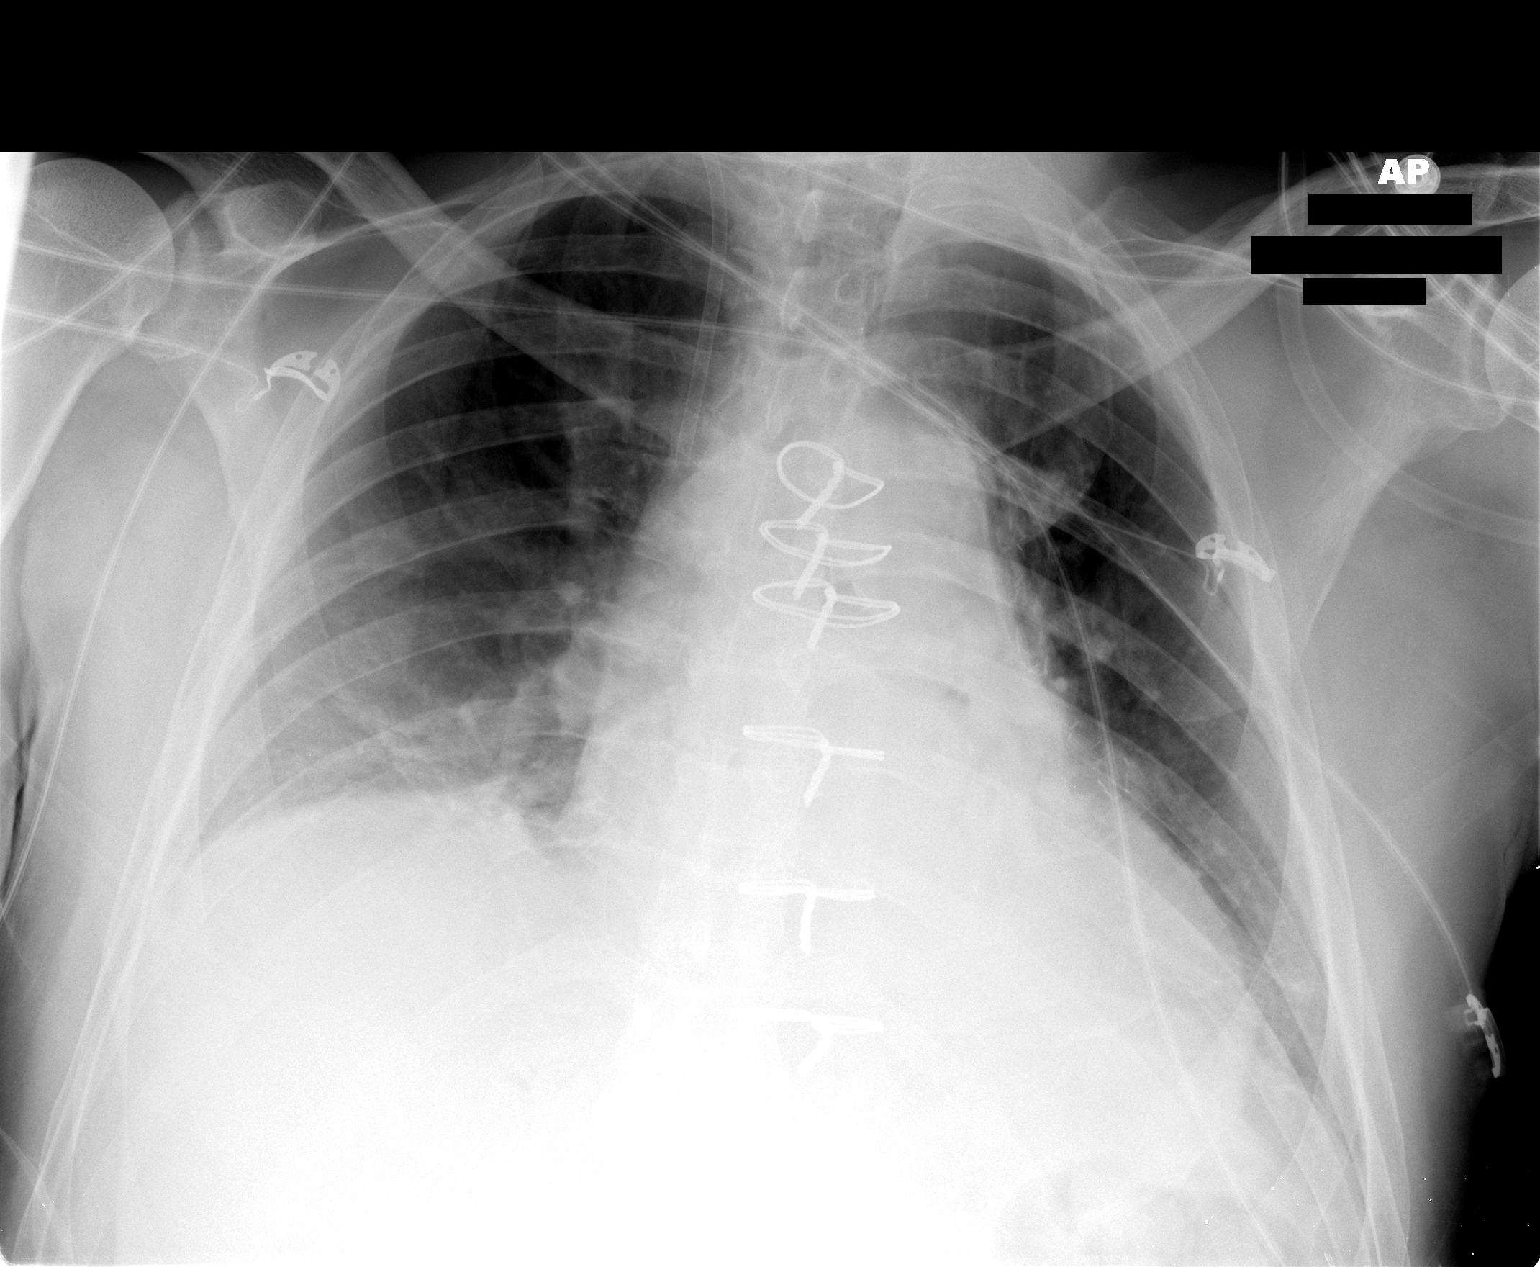

[1 of 1 positions shown; findings below may reference images not displayed]

FINDINGS: Swan-Ganz catheter and left chest tube and removed.
There is no evidence of pneumothorax or edema.  There is increase
in bilateral lower lobe atelectasis since the prior study, left
greater than right.  Cardiac and mediastinal contours are stable.
IMPRESSION: Increased bilateral lower lobe atelectasis, left greater than
right.  No pneumothorax.

## 2011-01-16 ENCOUNTER — Encounter (HOSPITAL_COMMUNITY): Payer: Self-pay

## 2011-01-17 ENCOUNTER — Encounter (HOSPITAL_COMMUNITY)
Admission: RE | Admit: 2011-01-17 | Discharge: 2011-01-17 | Disposition: A | Discharge status: Home / Self Care | Payer: Self-pay | Source: Ambulatory Visit | Attending: Cardiology | Admitting: Cardiology

## 2011-01-18 ENCOUNTER — Encounter (HOSPITAL_COMMUNITY)
Admission: RE | Admit: 2011-01-18 | Discharge: 2011-01-18 | Disposition: A | Discharge status: Home / Self Care | Payer: Self-pay | Source: Ambulatory Visit | Attending: Cardiology | Admitting: Cardiology

## 2011-01-19 ENCOUNTER — Encounter (HOSPITAL_COMMUNITY): Payer: Self-pay

## 2011-01-23 ENCOUNTER — Encounter (HOSPITAL_COMMUNITY)
Admission: RE | Admit: 2011-01-23 | Discharge: 2011-01-23 | Disposition: A | Discharge status: Home / Self Care | Payer: Self-pay | Source: Ambulatory Visit | Attending: Cardiology | Admitting: Cardiology

## 2011-01-24 ENCOUNTER — Encounter (HOSPITAL_COMMUNITY)
Admission: RE | Admit: 2011-01-24 | Discharge: 2011-01-24 | Disposition: A | Discharge status: Home / Self Care | Payer: Self-pay | Source: Ambulatory Visit | Attending: Cardiology | Admitting: Cardiology

## 2011-01-26 ENCOUNTER — Encounter (HOSPITAL_COMMUNITY)
Admission: RE | Admit: 2011-01-26 | Discharge: 2011-01-26 | Disposition: A | Discharge status: Home / Self Care | Payer: Self-pay | Source: Ambulatory Visit | Attending: Cardiology | Admitting: Cardiology

## 2011-01-30 ENCOUNTER — Encounter (HOSPITAL_COMMUNITY): Payer: Self-pay

## 2011-01-31 ENCOUNTER — Encounter (HOSPITAL_COMMUNITY): Payer: Self-pay

## 2011-02-01 ENCOUNTER — Encounter (HOSPITAL_COMMUNITY)
Admission: RE | Admit: 2011-02-01 | Discharge: 2011-02-01 | Disposition: A | Discharge status: Home / Self Care | Payer: Self-pay | Source: Ambulatory Visit | Attending: Cardiology | Admitting: Cardiology

## 2011-02-02 ENCOUNTER — Encounter (HOSPITAL_COMMUNITY): Payer: Self-pay

## 2011-02-06 ENCOUNTER — Encounter (HOSPITAL_COMMUNITY): Payer: Self-pay

## 2011-02-07 ENCOUNTER — Encounter (HOSPITAL_COMMUNITY)
Admission: RE | Admit: 2011-02-07 | Discharge: 2011-02-07 | Disposition: A | Discharge status: Home / Self Care | Payer: Self-pay | Source: Ambulatory Visit | Attending: Cardiology | Admitting: Cardiology

## 2011-02-08 ENCOUNTER — Encounter (HOSPITAL_COMMUNITY)
Admission: RE | Admit: 2011-02-08 | Discharge: 2011-02-08 | Disposition: A | Discharge status: Home / Self Care | Payer: Self-pay | Source: Ambulatory Visit | Attending: Cardiology | Admitting: Cardiology

## 2011-02-09 ENCOUNTER — Encounter (HOSPITAL_COMMUNITY): Payer: Self-pay

## 2011-02-13 ENCOUNTER — Encounter (HOSPITAL_COMMUNITY)
Admission: RE | Admit: 2011-02-13 | Discharge: 2011-02-13 | Disposition: A | Discharge status: Home / Self Care | Payer: Self-pay | Source: Ambulatory Visit | Attending: Cardiology | Admitting: Cardiology

## 2011-02-13 DIAGNOSIS — E785 Hyperlipidemia, unspecified: Secondary | ICD-10-CM | POA: Insufficient documentation

## 2011-02-13 DIAGNOSIS — R002 Palpitations: Secondary | ICD-10-CM | POA: Insufficient documentation

## 2011-02-13 DIAGNOSIS — Z5189 Encounter for other specified aftercare: Secondary | ICD-10-CM | POA: Insufficient documentation

## 2011-02-13 DIAGNOSIS — K219 Gastro-esophageal reflux disease without esophagitis: Secondary | ICD-10-CM | POA: Insufficient documentation

## 2011-02-13 DIAGNOSIS — Z951 Presence of aortocoronary bypass graft: Secondary | ICD-10-CM | POA: Insufficient documentation

## 2011-02-13 DIAGNOSIS — I10 Essential (primary) hypertension: Secondary | ICD-10-CM | POA: Insufficient documentation

## 2011-02-13 DIAGNOSIS — Z8249 Family history of ischemic heart disease and other diseases of the circulatory system: Secondary | ICD-10-CM | POA: Insufficient documentation

## 2011-02-13 DIAGNOSIS — E039 Hypothyroidism, unspecified: Secondary | ICD-10-CM | POA: Insufficient documentation

## 2011-02-13 DIAGNOSIS — Z8546 Personal history of malignant neoplasm of prostate: Secondary | ICD-10-CM | POA: Insufficient documentation

## 2011-02-13 DIAGNOSIS — I2510 Atherosclerotic heart disease of native coronary artery without angina pectoris: Secondary | ICD-10-CM | POA: Insufficient documentation

## 2011-02-14 ENCOUNTER — Encounter (HOSPITAL_COMMUNITY)
Admission: RE | Admit: 2011-02-14 | Discharge: 2011-02-14 | Disposition: A | Discharge status: Home / Self Care | Payer: Self-pay | Source: Ambulatory Visit | Attending: Cardiology | Admitting: Cardiology

## 2011-02-16 ENCOUNTER — Encounter (HOSPITAL_COMMUNITY): Payer: Self-pay

## 2011-02-20 ENCOUNTER — Encounter (HOSPITAL_COMMUNITY)
Admission: RE | Admit: 2011-02-20 | Discharge: 2011-02-20 | Disposition: A | Discharge status: Home / Self Care | Payer: Self-pay | Source: Ambulatory Visit | Attending: Cardiology | Admitting: Cardiology

## 2011-02-21 ENCOUNTER — Encounter (HOSPITAL_COMMUNITY)
Admission: RE | Admit: 2011-02-21 | Discharge: 2011-02-21 | Disposition: A | Discharge status: Home / Self Care | Payer: Self-pay | Source: Ambulatory Visit | Attending: Cardiology | Admitting: Cardiology

## 2011-02-23 ENCOUNTER — Encounter (HOSPITAL_COMMUNITY)
Admission: RE | Admit: 2011-02-23 | Discharge: 2011-02-23 | Disposition: A | Discharge status: Home / Self Care | Payer: Self-pay | Source: Ambulatory Visit | Attending: Cardiology | Admitting: Cardiology

## 2011-02-27 ENCOUNTER — Encounter (HOSPITAL_COMMUNITY)
Admission: RE | Admit: 2011-02-27 | Discharge: 2011-02-27 | Disposition: A | Discharge status: Home / Self Care | Payer: Self-pay | Source: Ambulatory Visit | Attending: Cardiology | Admitting: Cardiology

## 2011-02-28 ENCOUNTER — Encounter (HOSPITAL_COMMUNITY)
Admission: RE | Admit: 2011-02-28 | Discharge: 2011-02-28 | Disposition: A | Discharge status: Home / Self Care | Payer: Self-pay | Source: Ambulatory Visit | Attending: Cardiology | Admitting: Cardiology

## 2011-03-02 ENCOUNTER — Encounter (HOSPITAL_COMMUNITY): Payer: Self-pay

## 2011-03-06 ENCOUNTER — Encounter (HOSPITAL_COMMUNITY)
Admission: RE | Admit: 2011-03-06 | Discharge: 2011-03-06 | Disposition: A | Discharge status: Home / Self Care | Payer: Self-pay | Source: Ambulatory Visit | Attending: Cardiology | Admitting: Cardiology

## 2011-03-07 ENCOUNTER — Encounter (HOSPITAL_COMMUNITY)
Admission: RE | Admit: 2011-03-07 | Discharge: 2011-03-07 | Disposition: A | Discharge status: Home / Self Care | Payer: Self-pay | Source: Ambulatory Visit | Attending: Cardiology | Admitting: Cardiology

## 2011-03-09 ENCOUNTER — Encounter (HOSPITAL_COMMUNITY)
Admission: RE | Admit: 2011-03-09 | Discharge: 2011-03-09 | Disposition: A | Discharge status: Home / Self Care | Payer: Self-pay | Source: Ambulatory Visit | Attending: Cardiology | Admitting: Cardiology

## 2011-03-13 ENCOUNTER — Encounter (HOSPITAL_COMMUNITY)
Admission: RE | Admit: 2011-03-13 | Discharge: 2011-03-13 | Disposition: A | Discharge status: Home / Self Care | Payer: Self-pay | Source: Ambulatory Visit | Attending: Cardiology | Admitting: Cardiology

## 2011-03-13 DIAGNOSIS — I10 Essential (primary) hypertension: Secondary | ICD-10-CM | POA: Insufficient documentation

## 2011-03-13 DIAGNOSIS — K219 Gastro-esophageal reflux disease without esophagitis: Secondary | ICD-10-CM | POA: Insufficient documentation

## 2011-03-13 DIAGNOSIS — R002 Palpitations: Secondary | ICD-10-CM | POA: Insufficient documentation

## 2011-03-13 DIAGNOSIS — Z8249 Family history of ischemic heart disease and other diseases of the circulatory system: Secondary | ICD-10-CM | POA: Insufficient documentation

## 2011-03-13 DIAGNOSIS — Z8546 Personal history of malignant neoplasm of prostate: Secondary | ICD-10-CM | POA: Insufficient documentation

## 2011-03-13 DIAGNOSIS — Z951 Presence of aortocoronary bypass graft: Secondary | ICD-10-CM | POA: Insufficient documentation

## 2011-03-13 DIAGNOSIS — E785 Hyperlipidemia, unspecified: Secondary | ICD-10-CM | POA: Insufficient documentation

## 2011-03-13 DIAGNOSIS — I2510 Atherosclerotic heart disease of native coronary artery without angina pectoris: Secondary | ICD-10-CM | POA: Insufficient documentation

## 2011-03-13 DIAGNOSIS — Z5189 Encounter for other specified aftercare: Secondary | ICD-10-CM | POA: Insufficient documentation

## 2011-03-13 DIAGNOSIS — E039 Hypothyroidism, unspecified: Secondary | ICD-10-CM | POA: Insufficient documentation

## 2011-03-14 ENCOUNTER — Encounter (HOSPITAL_COMMUNITY)
Admission: RE | Admit: 2011-03-14 | Discharge: 2011-03-14 | Disposition: A | Discharge status: Home / Self Care | Payer: Self-pay | Source: Ambulatory Visit | Attending: Cardiology | Admitting: Cardiology

## 2011-03-15 ENCOUNTER — Encounter (HOSPITAL_COMMUNITY)
Admission: RE | Admit: 2011-03-15 | Discharge: 2011-03-15 | Disposition: A | Discharge status: Home / Self Care | Payer: Self-pay | Source: Ambulatory Visit | Attending: Cardiology | Admitting: Cardiology

## 2011-03-16 ENCOUNTER — Encounter (HOSPITAL_COMMUNITY): Payer: Self-pay

## 2011-03-20 ENCOUNTER — Encounter (HOSPITAL_COMMUNITY)
Admission: RE | Admit: 2011-03-20 | Discharge: 2011-03-20 | Disposition: A | Discharge status: Home / Self Care | Payer: Self-pay | Source: Ambulatory Visit | Attending: Cardiology | Admitting: Cardiology

## 2011-03-21 ENCOUNTER — Encounter (HOSPITAL_COMMUNITY)
Admission: RE | Admit: 2011-03-21 | Discharge: 2011-03-21 | Disposition: A | Discharge status: Home / Self Care | Payer: Self-pay | Source: Ambulatory Visit | Attending: Cardiology | Admitting: Cardiology

## 2011-03-23 ENCOUNTER — Encounter (HOSPITAL_COMMUNITY)
Admission: RE | Admit: 2011-03-23 | Discharge: 2011-03-23 | Disposition: A | Discharge status: Home / Self Care | Payer: Self-pay | Source: Ambulatory Visit | Attending: Cardiology | Admitting: Cardiology

## 2011-03-27 ENCOUNTER — Encounter (HOSPITAL_COMMUNITY): Payer: Self-pay

## 2011-03-28 ENCOUNTER — Encounter (HOSPITAL_COMMUNITY)
Admission: RE | Admit: 2011-03-28 | Discharge: 2011-03-28 | Disposition: A | Discharge status: Home / Self Care | Payer: Self-pay | Source: Ambulatory Visit | Attending: Cardiology | Admitting: Cardiology

## 2011-03-30 ENCOUNTER — Encounter (HOSPITAL_COMMUNITY)
Admission: RE | Admit: 2011-03-30 | Discharge: 2011-03-30 | Disposition: A | Discharge status: Home / Self Care | Payer: Self-pay | Source: Ambulatory Visit | Attending: Cardiology | Admitting: Cardiology

## 2011-04-03 ENCOUNTER — Encounter (HOSPITAL_COMMUNITY)
Admission: RE | Admit: 2011-04-03 | Discharge: 2011-04-03 | Disposition: A | Discharge status: Home / Self Care | Payer: Self-pay | Source: Ambulatory Visit | Attending: Cardiology | Admitting: Cardiology

## 2011-04-04 ENCOUNTER — Encounter (HOSPITAL_COMMUNITY)
Admission: RE | Admit: 2011-04-04 | Discharge: 2011-04-04 | Disposition: A | Discharge status: Home / Self Care | Payer: Self-pay | Source: Ambulatory Visit | Attending: Cardiology | Admitting: Cardiology

## 2011-04-06 ENCOUNTER — Encounter (HOSPITAL_COMMUNITY): Payer: Self-pay

## 2011-04-10 ENCOUNTER — Encounter (HOSPITAL_COMMUNITY)
Admission: RE | Admit: 2011-04-10 | Discharge: 2011-04-10 | Disposition: A | Discharge status: Home / Self Care | Payer: Self-pay | Source: Ambulatory Visit | Attending: Cardiology | Admitting: Cardiology

## 2011-04-10 DIAGNOSIS — Z951 Presence of aortocoronary bypass graft: Secondary | ICD-10-CM | POA: Insufficient documentation

## 2011-04-10 DIAGNOSIS — Z8249 Family history of ischemic heart disease and other diseases of the circulatory system: Secondary | ICD-10-CM | POA: Insufficient documentation

## 2011-04-10 DIAGNOSIS — E785 Hyperlipidemia, unspecified: Secondary | ICD-10-CM | POA: Insufficient documentation

## 2011-04-10 DIAGNOSIS — Z8546 Personal history of malignant neoplasm of prostate: Secondary | ICD-10-CM | POA: Insufficient documentation

## 2011-04-10 DIAGNOSIS — I10 Essential (primary) hypertension: Secondary | ICD-10-CM | POA: Insufficient documentation

## 2011-04-10 DIAGNOSIS — K219 Gastro-esophageal reflux disease without esophagitis: Secondary | ICD-10-CM | POA: Insufficient documentation

## 2011-04-10 DIAGNOSIS — Z5189 Encounter for other specified aftercare: Secondary | ICD-10-CM | POA: Insufficient documentation

## 2011-04-10 DIAGNOSIS — E039 Hypothyroidism, unspecified: Secondary | ICD-10-CM | POA: Insufficient documentation

## 2011-04-10 DIAGNOSIS — I2510 Atherosclerotic heart disease of native coronary artery without angina pectoris: Secondary | ICD-10-CM | POA: Insufficient documentation

## 2011-04-10 DIAGNOSIS — R002 Palpitations: Secondary | ICD-10-CM | POA: Insufficient documentation

## 2011-04-11 ENCOUNTER — Encounter (HOSPITAL_COMMUNITY)
Admission: RE | Admit: 2011-04-11 | Discharge: 2011-04-11 | Disposition: A | Discharge status: Home / Self Care | Payer: Self-pay | Source: Ambulatory Visit | Attending: Cardiology | Admitting: Cardiology

## 2011-04-13 ENCOUNTER — Encounter (HOSPITAL_COMMUNITY): Payer: Self-pay

## 2011-04-17 ENCOUNTER — Encounter (HOSPITAL_COMMUNITY)
Admission: RE | Admit: 2011-04-17 | Discharge: 2011-04-17 | Disposition: A | Discharge status: Home / Self Care | Payer: Self-pay | Source: Ambulatory Visit | Attending: Cardiology | Admitting: Cardiology

## 2011-04-18 ENCOUNTER — Encounter (HOSPITAL_COMMUNITY)
Admission: RE | Admit: 2011-04-18 | Discharge: 2011-04-18 | Disposition: A | Discharge status: Home / Self Care | Payer: Self-pay | Source: Ambulatory Visit | Attending: Cardiology | Admitting: Cardiology

## 2011-04-20 ENCOUNTER — Encounter (HOSPITAL_COMMUNITY)
Admission: RE | Admit: 2011-04-20 | Discharge: 2011-04-20 | Disposition: A | Discharge status: Home / Self Care | Payer: Self-pay | Source: Ambulatory Visit | Attending: Cardiology | Admitting: Cardiology

## 2011-04-24 ENCOUNTER — Encounter (HOSPITAL_COMMUNITY)
Admission: RE | Admit: 2011-04-24 | Discharge: 2011-04-24 | Disposition: A | Discharge status: Home / Self Care | Payer: Self-pay | Source: Ambulatory Visit | Attending: Cardiology | Admitting: Cardiology

## 2011-04-25 ENCOUNTER — Encounter (HOSPITAL_COMMUNITY)
Admission: RE | Admit: 2011-04-25 | Discharge: 2011-04-25 | Disposition: A | Discharge status: Home / Self Care | Payer: Self-pay | Source: Ambulatory Visit | Attending: Cardiology | Admitting: Cardiology

## 2011-04-27 ENCOUNTER — Encounter (HOSPITAL_COMMUNITY): Payer: Self-pay

## 2011-05-01 ENCOUNTER — Encounter (HOSPITAL_COMMUNITY)
Admission: RE | Admit: 2011-05-01 | Discharge: 2011-05-01 | Disposition: A | Discharge status: Home / Self Care | Payer: Self-pay | Source: Ambulatory Visit | Attending: Cardiology | Admitting: Cardiology

## 2011-05-02 ENCOUNTER — Encounter (HOSPITAL_COMMUNITY)
Admission: RE | Admit: 2011-05-02 | Discharge: 2011-05-02 | Disposition: A | Discharge status: Home / Self Care | Payer: Self-pay | Source: Ambulatory Visit | Attending: Cardiology | Admitting: Cardiology

## 2011-05-04 ENCOUNTER — Encounter (HOSPITAL_COMMUNITY): Payer: Self-pay

## 2011-05-08 ENCOUNTER — Encounter (HOSPITAL_COMMUNITY)
Admission: RE | Admit: 2011-05-08 | Discharge: 2011-05-08 | Disposition: A | Discharge status: Home / Self Care | Payer: Self-pay | Source: Ambulatory Visit | Attending: Cardiology | Admitting: Cardiology

## 2011-05-09 ENCOUNTER — Encounter (HOSPITAL_COMMUNITY)
Admission: RE | Admit: 2011-05-09 | Discharge: 2011-05-09 | Disposition: A | Discharge status: Home / Self Care | Payer: Self-pay | Source: Ambulatory Visit | Attending: Cardiology | Admitting: Cardiology

## 2011-05-11 ENCOUNTER — Encounter (HOSPITAL_COMMUNITY)
Admission: RE | Admit: 2011-05-11 | Discharge: 2011-05-11 | Disposition: A | Discharge status: Home / Self Care | Payer: Self-pay | Source: Ambulatory Visit | Attending: Cardiology | Admitting: Cardiology

## 2011-05-11 DIAGNOSIS — E785 Hyperlipidemia, unspecified: Secondary | ICD-10-CM | POA: Insufficient documentation

## 2011-05-11 DIAGNOSIS — R002 Palpitations: Secondary | ICD-10-CM | POA: Insufficient documentation

## 2011-05-11 DIAGNOSIS — E039 Hypothyroidism, unspecified: Secondary | ICD-10-CM | POA: Insufficient documentation

## 2011-05-11 DIAGNOSIS — I2510 Atherosclerotic heart disease of native coronary artery without angina pectoris: Secondary | ICD-10-CM | POA: Insufficient documentation

## 2011-05-11 DIAGNOSIS — I10 Essential (primary) hypertension: Secondary | ICD-10-CM | POA: Insufficient documentation

## 2011-05-11 DIAGNOSIS — Z8249 Family history of ischemic heart disease and other diseases of the circulatory system: Secondary | ICD-10-CM | POA: Insufficient documentation

## 2011-05-11 DIAGNOSIS — Z951 Presence of aortocoronary bypass graft: Secondary | ICD-10-CM | POA: Insufficient documentation

## 2011-05-11 DIAGNOSIS — Z8546 Personal history of malignant neoplasm of prostate: Secondary | ICD-10-CM | POA: Insufficient documentation

## 2011-05-11 DIAGNOSIS — K219 Gastro-esophageal reflux disease without esophagitis: Secondary | ICD-10-CM | POA: Insufficient documentation

## 2011-05-11 DIAGNOSIS — Z5189 Encounter for other specified aftercare: Secondary | ICD-10-CM | POA: Insufficient documentation

## 2011-05-15 ENCOUNTER — Encounter (HOSPITAL_COMMUNITY)
Admission: RE | Admit: 2011-05-15 | Discharge: 2011-05-15 | Disposition: A | Discharge status: Home / Self Care | Payer: Self-pay | Source: Ambulatory Visit | Attending: Cardiology | Admitting: Cardiology

## 2011-05-16 ENCOUNTER — Encounter (HOSPITAL_COMMUNITY)
Admission: RE | Admit: 2011-05-16 | Discharge: 2011-05-16 | Disposition: A | Discharge status: Home / Self Care | Payer: Self-pay | Source: Ambulatory Visit | Attending: Cardiology | Admitting: Cardiology

## 2011-05-18 ENCOUNTER — Encounter (HOSPITAL_COMMUNITY)
Admission: RE | Admit: 2011-05-18 | Discharge: 2011-05-18 | Disposition: A | Discharge status: Home / Self Care | Payer: Self-pay | Source: Ambulatory Visit | Attending: Cardiology | Admitting: Cardiology

## 2011-05-22 ENCOUNTER — Encounter (HOSPITAL_COMMUNITY): Payer: Self-pay

## 2011-05-23 ENCOUNTER — Encounter (HOSPITAL_COMMUNITY)
Admission: RE | Admit: 2011-05-23 | Discharge: 2011-05-23 | Disposition: A | Discharge status: Home / Self Care | Payer: Self-pay | Source: Ambulatory Visit | Attending: Cardiology | Admitting: Cardiology

## 2011-05-25 ENCOUNTER — Encounter (HOSPITAL_COMMUNITY)
Admission: RE | Admit: 2011-05-25 | Discharge: 2011-05-25 | Disposition: A | Discharge status: Home / Self Care | Payer: Self-pay | Source: Ambulatory Visit | Attending: Cardiology | Admitting: Cardiology

## 2011-05-29 ENCOUNTER — Encounter (HOSPITAL_COMMUNITY)
Admission: RE | Admit: 2011-05-29 | Discharge: 2011-05-29 | Disposition: A | Discharge status: Home / Self Care | Payer: Self-pay | Source: Ambulatory Visit | Attending: Cardiology | Admitting: Cardiology

## 2011-05-30 ENCOUNTER — Encounter (HOSPITAL_COMMUNITY)
Admission: RE | Admit: 2011-05-30 | Discharge: 2011-05-30 | Disposition: A | Discharge status: Home / Self Care | Payer: Self-pay | Source: Ambulatory Visit | Attending: Cardiology | Admitting: Cardiology

## 2011-06-01 ENCOUNTER — Encounter (HOSPITAL_COMMUNITY)
Admission: RE | Admit: 2011-06-01 | Discharge: 2011-06-01 | Disposition: A | Discharge status: Home / Self Care | Payer: Self-pay | Source: Ambulatory Visit | Attending: Cardiology | Admitting: Cardiology

## 2011-06-05 ENCOUNTER — Encounter (HOSPITAL_COMMUNITY): Payer: Self-pay

## 2011-06-06 ENCOUNTER — Encounter (HOSPITAL_COMMUNITY)
Admission: RE | Admit: 2011-06-06 | Discharge: 2011-06-06 | Disposition: A | Discharge status: Home / Self Care | Payer: Self-pay | Source: Ambulatory Visit | Attending: Cardiology | Admitting: Cardiology

## 2011-06-08 ENCOUNTER — Encounter (HOSPITAL_COMMUNITY): Payer: Self-pay

## 2011-06-12 ENCOUNTER — Encounter (HOSPITAL_COMMUNITY)
Admission: RE | Admit: 2011-06-12 | Discharge: 2011-06-12 | Disposition: A | Discharge status: Home / Self Care | Payer: Self-pay | Source: Ambulatory Visit | Attending: Cardiology | Admitting: Cardiology

## 2011-06-12 DIAGNOSIS — E785 Hyperlipidemia, unspecified: Secondary | ICD-10-CM | POA: Insufficient documentation

## 2011-06-12 DIAGNOSIS — Z5189 Encounter for other specified aftercare: Secondary | ICD-10-CM | POA: Insufficient documentation

## 2011-06-12 DIAGNOSIS — Z8546 Personal history of malignant neoplasm of prostate: Secondary | ICD-10-CM | POA: Insufficient documentation

## 2011-06-12 DIAGNOSIS — I10 Essential (primary) hypertension: Secondary | ICD-10-CM | POA: Insufficient documentation

## 2011-06-12 DIAGNOSIS — R002 Palpitations: Secondary | ICD-10-CM | POA: Insufficient documentation

## 2011-06-12 DIAGNOSIS — K219 Gastro-esophageal reflux disease without esophagitis: Secondary | ICD-10-CM | POA: Insufficient documentation

## 2011-06-12 DIAGNOSIS — Z951 Presence of aortocoronary bypass graft: Secondary | ICD-10-CM | POA: Insufficient documentation

## 2011-06-12 DIAGNOSIS — Z8249 Family history of ischemic heart disease and other diseases of the circulatory system: Secondary | ICD-10-CM | POA: Insufficient documentation

## 2011-06-12 DIAGNOSIS — E039 Hypothyroidism, unspecified: Secondary | ICD-10-CM | POA: Insufficient documentation

## 2011-06-12 DIAGNOSIS — I2510 Atherosclerotic heart disease of native coronary artery without angina pectoris: Secondary | ICD-10-CM | POA: Insufficient documentation

## 2011-06-13 ENCOUNTER — Encounter (HOSPITAL_COMMUNITY)
Admission: RE | Admit: 2011-06-13 | Discharge: 2011-06-13 | Disposition: A | Discharge status: Home / Self Care | Payer: Self-pay | Source: Ambulatory Visit | Attending: Cardiology | Admitting: Cardiology

## 2011-06-15 ENCOUNTER — Encounter (HOSPITAL_COMMUNITY)
Admission: RE | Admit: 2011-06-15 | Discharge: 2011-06-15 | Disposition: A | Discharge status: Home / Self Care | Payer: Self-pay | Source: Ambulatory Visit | Attending: Cardiology | Admitting: Cardiology

## 2011-06-19 ENCOUNTER — Encounter (HOSPITAL_COMMUNITY)
Admission: RE | Admit: 2011-06-19 | Discharge: 2011-06-19 | Disposition: A | Discharge status: Home / Self Care | Payer: Self-pay | Source: Ambulatory Visit | Attending: Cardiology | Admitting: Cardiology

## 2011-06-20 ENCOUNTER — Encounter (HOSPITAL_COMMUNITY): Payer: Self-pay

## 2011-06-22 ENCOUNTER — Encounter (HOSPITAL_COMMUNITY)
Admission: RE | Admit: 2011-06-22 | Discharge: 2011-06-22 | Disposition: A | Discharge status: Home / Self Care | Payer: Self-pay | Source: Ambulatory Visit | Attending: Cardiology | Admitting: Cardiology

## 2011-06-26 ENCOUNTER — Encounter (HOSPITAL_COMMUNITY)
Admission: RE | Admit: 2011-06-26 | Discharge: 2011-06-26 | Disposition: A | Discharge status: Home / Self Care | Payer: Self-pay | Source: Ambulatory Visit | Attending: Cardiology | Admitting: Cardiology

## 2011-06-27 ENCOUNTER — Encounter (HOSPITAL_COMMUNITY)
Admission: RE | Admit: 2011-06-27 | Discharge: 2011-06-27 | Disposition: A | Discharge status: Home / Self Care | Payer: Self-pay | Source: Ambulatory Visit | Attending: Cardiology | Admitting: Cardiology

## 2011-06-29 ENCOUNTER — Encounter (HOSPITAL_COMMUNITY): Payer: Self-pay

## 2011-07-03 ENCOUNTER — Encounter (HOSPITAL_COMMUNITY): Payer: Self-pay

## 2011-07-04 ENCOUNTER — Encounter (HOSPITAL_COMMUNITY): Payer: Self-pay

## 2011-07-06 ENCOUNTER — Encounter (HOSPITAL_COMMUNITY): Payer: Self-pay

## 2011-07-10 ENCOUNTER — Encounter (HOSPITAL_COMMUNITY): Payer: Self-pay

## 2011-07-10 DIAGNOSIS — I10 Essential (primary) hypertension: Secondary | ICD-10-CM | POA: Insufficient documentation

## 2011-07-10 DIAGNOSIS — I2510 Atherosclerotic heart disease of native coronary artery without angina pectoris: Secondary | ICD-10-CM | POA: Insufficient documentation

## 2011-07-10 DIAGNOSIS — Z951 Presence of aortocoronary bypass graft: Secondary | ICD-10-CM | POA: Insufficient documentation

## 2011-07-10 DIAGNOSIS — E039 Hypothyroidism, unspecified: Secondary | ICD-10-CM | POA: Insufficient documentation

## 2011-07-10 DIAGNOSIS — Z8249 Family history of ischemic heart disease and other diseases of the circulatory system: Secondary | ICD-10-CM | POA: Insufficient documentation

## 2011-07-10 DIAGNOSIS — E785 Hyperlipidemia, unspecified: Secondary | ICD-10-CM | POA: Insufficient documentation

## 2011-07-10 DIAGNOSIS — K219 Gastro-esophageal reflux disease without esophagitis: Secondary | ICD-10-CM | POA: Insufficient documentation

## 2011-07-10 DIAGNOSIS — Z8546 Personal history of malignant neoplasm of prostate: Secondary | ICD-10-CM | POA: Insufficient documentation

## 2011-07-10 DIAGNOSIS — R002 Palpitations: Secondary | ICD-10-CM | POA: Insufficient documentation

## 2011-07-10 DIAGNOSIS — Z5189 Encounter for other specified aftercare: Secondary | ICD-10-CM | POA: Insufficient documentation

## 2011-07-11 ENCOUNTER — Encounter (HOSPITAL_COMMUNITY)
Admission: RE | Admit: 2011-07-11 | Discharge: 2011-07-11 | Disposition: A | Discharge status: Home / Self Care | Payer: Self-pay | Source: Ambulatory Visit | Attending: Cardiology | Admitting: Cardiology

## 2011-07-17 ENCOUNTER — Encounter (HOSPITAL_COMMUNITY)
Admission: RE | Admit: 2011-07-17 | Discharge: 2011-07-17 | Disposition: A | Discharge status: Home / Self Care | Payer: Self-pay | Source: Ambulatory Visit | Attending: Cardiology | Admitting: Cardiology

## 2011-07-18 ENCOUNTER — Encounter (HOSPITAL_COMMUNITY)
Admission: RE | Admit: 2011-07-18 | Discharge: 2011-07-18 | Disposition: A | Discharge status: Home / Self Care | Payer: Self-pay | Source: Ambulatory Visit | Attending: Cardiology | Admitting: Cardiology

## 2011-07-20 ENCOUNTER — Encounter (HOSPITAL_COMMUNITY)
Admission: RE | Admit: 2011-07-20 | Discharge: 2011-07-20 | Disposition: A | Discharge status: Home / Self Care | Payer: Self-pay | Source: Ambulatory Visit | Attending: Cardiology | Admitting: Cardiology

## 2011-07-24 ENCOUNTER — Encounter (HOSPITAL_COMMUNITY)
Admission: RE | Admit: 2011-07-24 | Discharge: 2011-07-24 | Disposition: A | Discharge status: Home / Self Care | Payer: Self-pay | Source: Ambulatory Visit | Attending: Cardiology | Admitting: Cardiology

## 2011-07-25 ENCOUNTER — Encounter (HOSPITAL_COMMUNITY): Payer: Self-pay

## 2011-07-27 ENCOUNTER — Encounter (HOSPITAL_COMMUNITY)
Admission: RE | Admit: 2011-07-27 | Discharge: 2011-07-27 | Disposition: A | Discharge status: Home / Self Care | Payer: Self-pay | Source: Ambulatory Visit | Attending: Cardiology | Admitting: Cardiology

## 2011-07-31 ENCOUNTER — Encounter (HOSPITAL_COMMUNITY)
Admission: RE | Admit: 2011-07-31 | Discharge: 2011-07-31 | Disposition: A | Discharge status: Home / Self Care | Payer: Self-pay | Source: Ambulatory Visit | Attending: Cardiology | Admitting: Cardiology

## 2011-08-01 ENCOUNTER — Encounter (HOSPITAL_COMMUNITY)
Admission: RE | Admit: 2011-08-01 | Discharge: 2011-08-01 | Disposition: A | Discharge status: Home / Self Care | Payer: Self-pay | Source: Ambulatory Visit | Attending: Cardiology | Admitting: Cardiology

## 2011-08-03 ENCOUNTER — Encounter (HOSPITAL_COMMUNITY): Payer: Self-pay

## 2011-08-07 ENCOUNTER — Encounter (HOSPITAL_COMMUNITY): Payer: Self-pay

## 2011-08-08 ENCOUNTER — Encounter (HOSPITAL_COMMUNITY): Payer: Self-pay

## 2011-08-10 ENCOUNTER — Encounter (HOSPITAL_COMMUNITY): Payer: Self-pay

## 2011-08-10 DIAGNOSIS — R002 Palpitations: Secondary | ICD-10-CM | POA: Insufficient documentation

## 2011-08-10 DIAGNOSIS — E039 Hypothyroidism, unspecified: Secondary | ICD-10-CM | POA: Insufficient documentation

## 2011-08-10 DIAGNOSIS — I10 Essential (primary) hypertension: Secondary | ICD-10-CM | POA: Insufficient documentation

## 2011-08-10 DIAGNOSIS — I2510 Atherosclerotic heart disease of native coronary artery without angina pectoris: Secondary | ICD-10-CM | POA: Insufficient documentation

## 2011-08-10 DIAGNOSIS — Z8249 Family history of ischemic heart disease and other diseases of the circulatory system: Secondary | ICD-10-CM | POA: Insufficient documentation

## 2011-08-10 DIAGNOSIS — E785 Hyperlipidemia, unspecified: Secondary | ICD-10-CM | POA: Insufficient documentation

## 2011-08-10 DIAGNOSIS — K219 Gastro-esophageal reflux disease without esophagitis: Secondary | ICD-10-CM | POA: Insufficient documentation

## 2011-08-10 DIAGNOSIS — Z8546 Personal history of malignant neoplasm of prostate: Secondary | ICD-10-CM | POA: Insufficient documentation

## 2011-08-10 DIAGNOSIS — Z5189 Encounter for other specified aftercare: Secondary | ICD-10-CM | POA: Insufficient documentation

## 2011-08-10 DIAGNOSIS — Z951 Presence of aortocoronary bypass graft: Secondary | ICD-10-CM | POA: Insufficient documentation

## 2011-08-14 ENCOUNTER — Encounter (HOSPITAL_COMMUNITY): Payer: Self-pay

## 2011-08-15 ENCOUNTER — Encounter (HOSPITAL_COMMUNITY): Payer: Self-pay

## 2011-08-17 ENCOUNTER — Encounter (HOSPITAL_COMMUNITY)
Admission: RE | Admit: 2011-08-17 | Discharge: 2011-08-17 | Disposition: A | Discharge status: Home / Self Care | Payer: Self-pay | Source: Ambulatory Visit | Attending: Cardiology | Admitting: Cardiology

## 2011-08-21 ENCOUNTER — Encounter (HOSPITAL_COMMUNITY): Payer: Self-pay

## 2011-08-22 ENCOUNTER — Encounter (HOSPITAL_COMMUNITY): Payer: Self-pay

## 2011-08-24 ENCOUNTER — Encounter (HOSPITAL_COMMUNITY)
Admission: RE | Admit: 2011-08-24 | Discharge: 2011-08-24 | Disposition: A | Discharge status: Home / Self Care | Payer: Self-pay | Source: Ambulatory Visit | Attending: Cardiology | Admitting: Cardiology

## 2011-08-28 ENCOUNTER — Encounter (HOSPITAL_COMMUNITY)
Admission: RE | Admit: 2011-08-28 | Discharge: 2011-08-28 | Disposition: A | Discharge status: Home / Self Care | Payer: Self-pay | Source: Ambulatory Visit | Attending: Cardiology | Admitting: Cardiology

## 2011-08-29 ENCOUNTER — Encounter (HOSPITAL_COMMUNITY)
Admission: RE | Admit: 2011-08-29 | Discharge: 2011-08-29 | Disposition: A | Discharge status: Home / Self Care | Payer: Self-pay | Source: Ambulatory Visit | Attending: Cardiology | Admitting: Cardiology

## 2011-08-31 ENCOUNTER — Encounter (HOSPITAL_COMMUNITY)
Admission: RE | Admit: 2011-08-31 | Discharge: 2011-08-31 | Disposition: A | Discharge status: Home / Self Care | Payer: Self-pay | Source: Ambulatory Visit | Attending: Cardiology | Admitting: Cardiology

## 2011-09-04 ENCOUNTER — Encounter (HOSPITAL_COMMUNITY)
Admission: RE | Admit: 2011-09-04 | Discharge: 2011-09-04 | Disposition: A | Discharge status: Home / Self Care | Payer: Self-pay | Source: Ambulatory Visit | Attending: Cardiology | Admitting: Cardiology

## 2011-09-05 ENCOUNTER — Encounter (HOSPITAL_COMMUNITY)
Admission: RE | Admit: 2011-09-05 | Discharge: 2011-09-05 | Disposition: A | Discharge status: Home / Self Care | Payer: Self-pay | Source: Ambulatory Visit | Attending: Cardiology | Admitting: Cardiology

## 2011-09-07 ENCOUNTER — Encounter (HOSPITAL_COMMUNITY): Payer: Self-pay

## 2011-09-12 ENCOUNTER — Encounter (HOSPITAL_COMMUNITY)
Admission: RE | Admit: 2011-09-12 | Discharge: 2011-09-12 | Disposition: A | Discharge status: Home / Self Care | Payer: Self-pay | Source: Ambulatory Visit | Attending: Cardiology | Admitting: Cardiology

## 2011-09-12 DIAGNOSIS — E785 Hyperlipidemia, unspecified: Secondary | ICD-10-CM | POA: Insufficient documentation

## 2011-09-12 DIAGNOSIS — E039 Hypothyroidism, unspecified: Secondary | ICD-10-CM | POA: Insufficient documentation

## 2011-09-12 DIAGNOSIS — R002 Palpitations: Secondary | ICD-10-CM | POA: Insufficient documentation

## 2011-09-12 DIAGNOSIS — K219 Gastro-esophageal reflux disease without esophagitis: Secondary | ICD-10-CM | POA: Insufficient documentation

## 2011-09-12 DIAGNOSIS — Z8249 Family history of ischemic heart disease and other diseases of the circulatory system: Secondary | ICD-10-CM | POA: Insufficient documentation

## 2011-09-12 DIAGNOSIS — Z951 Presence of aortocoronary bypass graft: Secondary | ICD-10-CM | POA: Insufficient documentation

## 2011-09-12 DIAGNOSIS — I2510 Atherosclerotic heart disease of native coronary artery without angina pectoris: Secondary | ICD-10-CM | POA: Insufficient documentation

## 2011-09-12 DIAGNOSIS — Z5189 Encounter for other specified aftercare: Secondary | ICD-10-CM | POA: Insufficient documentation

## 2011-09-12 DIAGNOSIS — Z8546 Personal history of malignant neoplasm of prostate: Secondary | ICD-10-CM | POA: Insufficient documentation

## 2011-09-12 DIAGNOSIS — I10 Essential (primary) hypertension: Secondary | ICD-10-CM | POA: Insufficient documentation

## 2011-09-14 ENCOUNTER — Encounter (HOSPITAL_COMMUNITY)
Admission: RE | Admit: 2011-09-14 | Discharge: 2011-09-14 | Disposition: A | Discharge status: Home / Self Care | Payer: Self-pay | Source: Ambulatory Visit | Attending: Cardiology | Admitting: Cardiology

## 2011-09-18 ENCOUNTER — Encounter (HOSPITAL_COMMUNITY)
Admission: RE | Admit: 2011-09-18 | Discharge: 2011-09-18 | Disposition: A | Discharge status: Home / Self Care | Payer: Self-pay | Source: Ambulatory Visit | Attending: Cardiology | Admitting: Cardiology

## 2011-09-19 ENCOUNTER — Encounter (HOSPITAL_COMMUNITY): Payer: Self-pay

## 2011-09-21 ENCOUNTER — Encounter (HOSPITAL_COMMUNITY): Payer: Self-pay

## 2011-09-25 ENCOUNTER — Encounter (HOSPITAL_COMMUNITY)
Admission: RE | Admit: 2011-09-25 | Discharge: 2011-09-25 | Disposition: A | Discharge status: Home / Self Care | Payer: Self-pay | Source: Ambulatory Visit | Attending: Cardiology | Admitting: Cardiology

## 2011-09-26 ENCOUNTER — Encounter (HOSPITAL_COMMUNITY)
Admission: RE | Admit: 2011-09-26 | Discharge: 2011-09-26 | Disposition: A | Discharge status: Home / Self Care | Payer: Self-pay | Source: Ambulatory Visit | Attending: Cardiology | Admitting: Cardiology

## 2011-09-28 ENCOUNTER — Encounter (HOSPITAL_COMMUNITY)
Admission: RE | Admit: 2011-09-28 | Discharge: 2011-09-28 | Disposition: A | Discharge status: Home / Self Care | Payer: Self-pay | Source: Ambulatory Visit | Attending: Cardiology | Admitting: Cardiology

## 2011-10-02 ENCOUNTER — Encounter (HOSPITAL_COMMUNITY): Payer: Self-pay

## 2011-10-03 ENCOUNTER — Encounter (HOSPITAL_COMMUNITY)
Admission: RE | Admit: 2011-10-03 | Discharge: 2011-10-03 | Disposition: A | Discharge status: Home / Self Care | Payer: Self-pay | Source: Ambulatory Visit | Attending: Cardiology | Admitting: Cardiology

## 2011-10-04 ENCOUNTER — Encounter (HOSPITAL_COMMUNITY)
Admission: RE | Admit: 2011-10-04 | Discharge: 2011-10-04 | Disposition: A | Discharge status: Home / Self Care | Payer: Self-pay | Source: Ambulatory Visit | Attending: Cardiology | Admitting: Cardiology

## 2011-10-05 ENCOUNTER — Encounter (HOSPITAL_COMMUNITY): Payer: Self-pay

## 2011-10-09 ENCOUNTER — Encounter (HOSPITAL_COMMUNITY)
Admission: RE | Admit: 2011-10-09 | Discharge: 2011-10-09 | Disposition: A | Discharge status: Home / Self Care | Payer: Self-pay | Source: Ambulatory Visit | Attending: Cardiology | Admitting: Cardiology

## 2011-10-10 ENCOUNTER — Encounter (HOSPITAL_COMMUNITY)
Admission: RE | Admit: 2011-10-10 | Discharge: 2011-10-10 | Disposition: A | Discharge status: Home / Self Care | Payer: Self-pay | Source: Ambulatory Visit | Attending: Cardiology | Admitting: Cardiology

## 2011-10-10 DIAGNOSIS — E039 Hypothyroidism, unspecified: Secondary | ICD-10-CM | POA: Insufficient documentation

## 2011-10-10 DIAGNOSIS — Z5189 Encounter for other specified aftercare: Secondary | ICD-10-CM | POA: Insufficient documentation

## 2011-10-10 DIAGNOSIS — I10 Essential (primary) hypertension: Secondary | ICD-10-CM | POA: Insufficient documentation

## 2011-10-10 DIAGNOSIS — R002 Palpitations: Secondary | ICD-10-CM | POA: Insufficient documentation

## 2011-10-10 DIAGNOSIS — I2510 Atherosclerotic heart disease of native coronary artery without angina pectoris: Secondary | ICD-10-CM | POA: Insufficient documentation

## 2011-10-10 DIAGNOSIS — Z951 Presence of aortocoronary bypass graft: Secondary | ICD-10-CM | POA: Insufficient documentation

## 2011-10-10 DIAGNOSIS — Z8249 Family history of ischemic heart disease and other diseases of the circulatory system: Secondary | ICD-10-CM | POA: Insufficient documentation

## 2011-10-10 DIAGNOSIS — E785 Hyperlipidemia, unspecified: Secondary | ICD-10-CM | POA: Insufficient documentation

## 2011-10-10 DIAGNOSIS — Z8546 Personal history of malignant neoplasm of prostate: Secondary | ICD-10-CM | POA: Insufficient documentation

## 2011-10-10 DIAGNOSIS — K219 Gastro-esophageal reflux disease without esophagitis: Secondary | ICD-10-CM | POA: Insufficient documentation

## 2011-10-12 ENCOUNTER — Encounter (HOSPITAL_COMMUNITY)
Admission: RE | Admit: 2011-10-12 | Discharge: 2011-10-12 | Disposition: A | Discharge status: Home / Self Care | Payer: Self-pay | Source: Ambulatory Visit | Attending: Cardiology | Admitting: Cardiology

## 2011-10-16 ENCOUNTER — Encounter (HOSPITAL_COMMUNITY): Payer: Self-pay

## 2011-10-17 ENCOUNTER — Encounter (HOSPITAL_COMMUNITY)
Admission: RE | Admit: 2011-10-17 | Discharge: 2011-10-17 | Disposition: A | Discharge status: Home / Self Care | Payer: Self-pay | Source: Ambulatory Visit | Attending: Cardiology | Admitting: Cardiology

## 2011-10-19 ENCOUNTER — Encounter (HOSPITAL_COMMUNITY)
Admission: RE | Admit: 2011-10-19 | Discharge: 2011-10-19 | Disposition: A | Discharge status: Home / Self Care | Payer: Self-pay | Source: Ambulatory Visit | Attending: Cardiology | Admitting: Cardiology

## 2011-10-23 ENCOUNTER — Encounter (HOSPITAL_COMMUNITY)
Admission: RE | Admit: 2011-10-23 | Discharge: 2011-10-23 | Disposition: A | Discharge status: Home / Self Care | Payer: Self-pay | Source: Ambulatory Visit | Attending: Cardiology | Admitting: Cardiology

## 2011-10-24 ENCOUNTER — Encounter (HOSPITAL_COMMUNITY)
Admission: RE | Admit: 2011-10-24 | Discharge: 2011-10-24 | Disposition: A | Discharge status: Home / Self Care | Payer: Self-pay | Source: Ambulatory Visit | Attending: Cardiology | Admitting: Cardiology

## 2011-10-26 ENCOUNTER — Encounter (HOSPITAL_COMMUNITY)
Admission: RE | Admit: 2011-10-26 | Discharge: 2011-10-26 | Disposition: A | Discharge status: Home / Self Care | Payer: Self-pay | Source: Ambulatory Visit | Attending: Cardiology | Admitting: Cardiology

## 2011-10-30 ENCOUNTER — Encounter (HOSPITAL_COMMUNITY): Payer: Self-pay

## 2011-10-31 ENCOUNTER — Encounter (HOSPITAL_COMMUNITY): Payer: Self-pay

## 2011-11-02 ENCOUNTER — Encounter (HOSPITAL_COMMUNITY): Payer: Self-pay

## 2011-11-06 ENCOUNTER — Encounter (HOSPITAL_COMMUNITY): Payer: Self-pay

## 2011-11-07 ENCOUNTER — Encounter (HOSPITAL_COMMUNITY): Payer: Self-pay

## 2011-11-09 ENCOUNTER — Encounter (HOSPITAL_COMMUNITY): Payer: Self-pay

## 2011-11-13 ENCOUNTER — Encounter (HOSPITAL_COMMUNITY)
Admission: RE | Admit: 2011-11-13 | Discharge: 2011-11-13 | Disposition: A | Discharge status: Home / Self Care | Payer: Self-pay | Source: Ambulatory Visit | Attending: Cardiology | Admitting: Cardiology

## 2011-11-13 DIAGNOSIS — Z951 Presence of aortocoronary bypass graft: Secondary | ICD-10-CM | POA: Insufficient documentation

## 2011-11-13 DIAGNOSIS — I2510 Atherosclerotic heart disease of native coronary artery without angina pectoris: Secondary | ICD-10-CM | POA: Insufficient documentation

## 2011-11-13 DIAGNOSIS — E039 Hypothyroidism, unspecified: Secondary | ICD-10-CM | POA: Insufficient documentation

## 2011-11-13 DIAGNOSIS — E785 Hyperlipidemia, unspecified: Secondary | ICD-10-CM | POA: Insufficient documentation

## 2011-11-13 DIAGNOSIS — K219 Gastro-esophageal reflux disease without esophagitis: Secondary | ICD-10-CM | POA: Insufficient documentation

## 2011-11-13 DIAGNOSIS — Z5189 Encounter for other specified aftercare: Secondary | ICD-10-CM | POA: Insufficient documentation

## 2011-11-13 DIAGNOSIS — R002 Palpitations: Secondary | ICD-10-CM | POA: Insufficient documentation

## 2011-11-13 DIAGNOSIS — Z8249 Family history of ischemic heart disease and other diseases of the circulatory system: Secondary | ICD-10-CM | POA: Insufficient documentation

## 2011-11-13 DIAGNOSIS — Z8546 Personal history of malignant neoplasm of prostate: Secondary | ICD-10-CM | POA: Insufficient documentation

## 2011-11-13 DIAGNOSIS — I10 Essential (primary) hypertension: Secondary | ICD-10-CM | POA: Insufficient documentation

## 2011-11-14 ENCOUNTER — Encounter (HOSPITAL_COMMUNITY)
Admission: RE | Admit: 2011-11-14 | Discharge: 2011-11-14 | Disposition: A | Discharge status: Home / Self Care | Payer: Self-pay | Source: Ambulatory Visit | Attending: Cardiology | Admitting: Cardiology

## 2011-11-16 ENCOUNTER — Encounter (HOSPITAL_COMMUNITY): Payer: Self-pay

## 2011-11-20 ENCOUNTER — Encounter (HOSPITAL_COMMUNITY): Payer: Self-pay

## 2011-11-21 ENCOUNTER — Encounter (HOSPITAL_COMMUNITY): Payer: Self-pay

## 2011-11-23 ENCOUNTER — Encounter (HOSPITAL_COMMUNITY): Payer: Self-pay

## 2011-11-27 ENCOUNTER — Encounter (HOSPITAL_COMMUNITY)
Admission: RE | Admit: 2011-11-27 | Discharge: 2011-11-27 | Disposition: A | Discharge status: Home / Self Care | Payer: Self-pay | Source: Ambulatory Visit | Attending: Cardiology | Admitting: Cardiology

## 2011-11-28 ENCOUNTER — Encounter (HOSPITAL_COMMUNITY)
Admission: RE | Admit: 2011-11-28 | Discharge: 2011-11-28 | Disposition: A | Discharge status: Home / Self Care | Payer: Self-pay | Source: Ambulatory Visit | Attending: Cardiology | Admitting: Cardiology

## 2011-11-30 ENCOUNTER — Encounter (HOSPITAL_COMMUNITY)
Admission: RE | Admit: 2011-11-30 | Discharge: 2011-11-30 | Disposition: A | Discharge status: Home / Self Care | Payer: Self-pay | Source: Ambulatory Visit | Attending: Cardiology | Admitting: Cardiology

## 2011-12-04 ENCOUNTER — Encounter (HOSPITAL_COMMUNITY)
Admission: RE | Admit: 2011-12-04 | Discharge: 2011-12-04 | Disposition: A | Discharge status: Home / Self Care | Payer: Self-pay | Source: Ambulatory Visit | Attending: Cardiology | Admitting: Cardiology

## 2011-12-05 ENCOUNTER — Encounter (HOSPITAL_COMMUNITY)
Admission: RE | Admit: 2011-12-05 | Discharge: 2011-12-05 | Disposition: A | Discharge status: Home / Self Care | Payer: Self-pay | Source: Ambulatory Visit | Attending: Cardiology | Admitting: Cardiology

## 2011-12-11 ENCOUNTER — Encounter (HOSPITAL_COMMUNITY)
Admission: RE | Admit: 2011-12-11 | Discharge: 2011-12-11 | Disposition: A | Discharge status: Home / Self Care | Payer: Self-pay | Source: Ambulatory Visit | Attending: Cardiology | Admitting: Cardiology

## 2011-12-11 DIAGNOSIS — Z8249 Family history of ischemic heart disease and other diseases of the circulatory system: Secondary | ICD-10-CM | POA: Insufficient documentation

## 2011-12-11 DIAGNOSIS — I10 Essential (primary) hypertension: Secondary | ICD-10-CM | POA: Insufficient documentation

## 2011-12-11 DIAGNOSIS — Z8546 Personal history of malignant neoplasm of prostate: Secondary | ICD-10-CM | POA: Insufficient documentation

## 2011-12-11 DIAGNOSIS — I2510 Atherosclerotic heart disease of native coronary artery without angina pectoris: Secondary | ICD-10-CM | POA: Insufficient documentation

## 2011-12-11 DIAGNOSIS — E039 Hypothyroidism, unspecified: Secondary | ICD-10-CM | POA: Insufficient documentation

## 2011-12-11 DIAGNOSIS — Z5189 Encounter for other specified aftercare: Secondary | ICD-10-CM | POA: Insufficient documentation

## 2011-12-11 DIAGNOSIS — Z951 Presence of aortocoronary bypass graft: Secondary | ICD-10-CM | POA: Insufficient documentation

## 2011-12-11 DIAGNOSIS — E785 Hyperlipidemia, unspecified: Secondary | ICD-10-CM | POA: Insufficient documentation

## 2011-12-11 DIAGNOSIS — K219 Gastro-esophageal reflux disease without esophagitis: Secondary | ICD-10-CM | POA: Insufficient documentation

## 2011-12-11 DIAGNOSIS — R002 Palpitations: Secondary | ICD-10-CM | POA: Insufficient documentation

## 2011-12-12 ENCOUNTER — Encounter (HOSPITAL_COMMUNITY): Payer: Self-pay

## 2011-12-14 ENCOUNTER — Encounter (HOSPITAL_COMMUNITY): Payer: Self-pay

## 2011-12-18 ENCOUNTER — Encounter (HOSPITAL_COMMUNITY)
Admission: RE | Admit: 2011-12-18 | Discharge: 2011-12-18 | Disposition: A | Discharge status: Home / Self Care | Payer: Self-pay | Source: Ambulatory Visit | Attending: Cardiology | Admitting: Cardiology

## 2011-12-19 ENCOUNTER — Encounter (HOSPITAL_COMMUNITY)
Admission: RE | Admit: 2011-12-19 | Discharge: 2011-12-19 | Disposition: A | Discharge status: Home / Self Care | Payer: Self-pay | Source: Ambulatory Visit | Attending: Cardiology | Admitting: Cardiology

## 2011-12-21 ENCOUNTER — Encounter (HOSPITAL_COMMUNITY)
Admission: RE | Admit: 2011-12-21 | Discharge: 2011-12-21 | Disposition: A | Discharge status: Home / Self Care | Payer: Self-pay | Source: Ambulatory Visit | Attending: Cardiology | Admitting: Cardiology

## 2011-12-25 ENCOUNTER — Encounter (HOSPITAL_COMMUNITY)
Admission: RE | Admit: 2011-12-25 | Discharge: 2011-12-25 | Disposition: A | Discharge status: Home / Self Care | Payer: Self-pay | Source: Ambulatory Visit | Attending: Cardiology | Admitting: Cardiology

## 2011-12-26 ENCOUNTER — Encounter (HOSPITAL_COMMUNITY)
Admission: RE | Admit: 2011-12-26 | Discharge: 2011-12-26 | Disposition: A | Discharge status: Home / Self Care | Payer: Self-pay | Source: Ambulatory Visit | Attending: Cardiology | Admitting: Cardiology

## 2011-12-28 ENCOUNTER — Encounter (HOSPITAL_COMMUNITY): Payer: Self-pay

## 2012-01-01 ENCOUNTER — Encounter (HOSPITAL_COMMUNITY): Payer: Self-pay

## 2012-01-02 ENCOUNTER — Encounter (HOSPITAL_COMMUNITY): Payer: Self-pay

## 2012-01-04 ENCOUNTER — Encounter (HOSPITAL_COMMUNITY): Payer: Self-pay

## 2012-01-08 ENCOUNTER — Encounter (HOSPITAL_COMMUNITY)
Admission: RE | Admit: 2012-01-08 | Discharge: 2012-01-08 | Disposition: A | Discharge status: Home / Self Care | Payer: Self-pay | Source: Ambulatory Visit | Attending: Cardiology | Admitting: Cardiology

## 2012-01-09 ENCOUNTER — Encounter (HOSPITAL_COMMUNITY)
Admission: RE | Admit: 2012-01-09 | Discharge: 2012-01-09 | Disposition: A | Discharge status: Home / Self Care | Payer: Self-pay | Source: Ambulatory Visit | Attending: Cardiology | Admitting: Cardiology

## 2012-01-11 ENCOUNTER — Encounter (HOSPITAL_COMMUNITY): Payer: Self-pay

## 2012-01-11 DIAGNOSIS — Z5189 Encounter for other specified aftercare: Secondary | ICD-10-CM | POA: Insufficient documentation

## 2012-01-11 DIAGNOSIS — I2510 Atherosclerotic heart disease of native coronary artery without angina pectoris: Secondary | ICD-10-CM | POA: Insufficient documentation

## 2012-01-11 DIAGNOSIS — E039 Hypothyroidism, unspecified: Secondary | ICD-10-CM | POA: Insufficient documentation

## 2012-01-11 DIAGNOSIS — Z8546 Personal history of malignant neoplasm of prostate: Secondary | ICD-10-CM | POA: Insufficient documentation

## 2012-01-11 DIAGNOSIS — R002 Palpitations: Secondary | ICD-10-CM | POA: Insufficient documentation

## 2012-01-11 DIAGNOSIS — Z951 Presence of aortocoronary bypass graft: Secondary | ICD-10-CM | POA: Insufficient documentation

## 2012-01-11 DIAGNOSIS — I10 Essential (primary) hypertension: Secondary | ICD-10-CM | POA: Insufficient documentation

## 2012-01-11 DIAGNOSIS — E785 Hyperlipidemia, unspecified: Secondary | ICD-10-CM | POA: Insufficient documentation

## 2012-01-11 DIAGNOSIS — K219 Gastro-esophageal reflux disease without esophagitis: Secondary | ICD-10-CM | POA: Insufficient documentation

## 2012-01-11 DIAGNOSIS — Z8249 Family history of ischemic heart disease and other diseases of the circulatory system: Secondary | ICD-10-CM | POA: Insufficient documentation

## 2012-01-15 ENCOUNTER — Encounter (HOSPITAL_COMMUNITY)
Admission: RE | Admit: 2012-01-15 | Discharge: 2012-01-15 | Disposition: A | Discharge status: Home / Self Care | Payer: Self-pay | Source: Ambulatory Visit | Attending: Cardiology | Admitting: Cardiology

## 2012-01-16 ENCOUNTER — Encounter (HOSPITAL_COMMUNITY)
Admission: RE | Admit: 2012-01-16 | Discharge: 2012-01-16 | Disposition: A | Discharge status: Home / Self Care | Payer: Self-pay | Source: Ambulatory Visit | Attending: Cardiology | Admitting: Cardiology

## 2012-01-18 ENCOUNTER — Encounter (HOSPITAL_COMMUNITY): Payer: Self-pay

## 2012-01-22 ENCOUNTER — Encounter (HOSPITAL_COMMUNITY)
Admission: RE | Admit: 2012-01-22 | Discharge: 2012-01-22 | Disposition: A | Discharge status: Home / Self Care | Payer: Self-pay | Source: Ambulatory Visit | Attending: Cardiology | Admitting: Cardiology

## 2012-01-23 ENCOUNTER — Encounter (HOSPITAL_COMMUNITY)
Admission: RE | Admit: 2012-01-23 | Discharge: 2012-01-23 | Disposition: A | Discharge status: Home / Self Care | Payer: Self-pay | Source: Ambulatory Visit | Attending: Cardiology | Admitting: Cardiology

## 2012-01-25 ENCOUNTER — Encounter (HOSPITAL_COMMUNITY): Payer: Self-pay

## 2012-01-29 ENCOUNTER — Encounter (HOSPITAL_COMMUNITY): Payer: Self-pay

## 2012-01-30 ENCOUNTER — Encounter (HOSPITAL_COMMUNITY): Payer: Self-pay

## 2012-02-01 ENCOUNTER — Encounter (HOSPITAL_COMMUNITY)
Admission: RE | Admit: 2012-02-01 | Discharge: 2012-02-01 | Disposition: A | Discharge status: Home / Self Care | Payer: Self-pay | Source: Ambulatory Visit | Attending: Cardiology | Admitting: Cardiology

## 2012-02-05 ENCOUNTER — Encounter (HOSPITAL_COMMUNITY)
Admission: RE | Admit: 2012-02-05 | Discharge: 2012-02-05 | Disposition: A | Discharge status: Home / Self Care | Payer: Self-pay | Source: Ambulatory Visit | Attending: Cardiology | Admitting: Cardiology

## 2012-02-06 ENCOUNTER — Encounter (HOSPITAL_COMMUNITY)
Admission: RE | Admit: 2012-02-06 | Discharge: 2012-02-06 | Disposition: A | Discharge status: Home / Self Care | Payer: Self-pay | Source: Ambulatory Visit | Attending: Cardiology | Admitting: Cardiology

## 2012-02-08 ENCOUNTER — Encounter (HOSPITAL_COMMUNITY): Payer: Self-pay

## 2012-02-12 ENCOUNTER — Encounter (HOSPITAL_COMMUNITY)
Admission: RE | Admit: 2012-02-12 | Discharge: 2012-02-12 | Disposition: A | Discharge status: Home / Self Care | Payer: Self-pay | Source: Ambulatory Visit | Attending: Cardiology | Admitting: Cardiology

## 2012-02-12 DIAGNOSIS — R002 Palpitations: Secondary | ICD-10-CM | POA: Insufficient documentation

## 2012-02-12 DIAGNOSIS — I2510 Atherosclerotic heart disease of native coronary artery without angina pectoris: Secondary | ICD-10-CM | POA: Insufficient documentation

## 2012-02-12 DIAGNOSIS — E039 Hypothyroidism, unspecified: Secondary | ICD-10-CM | POA: Insufficient documentation

## 2012-02-12 DIAGNOSIS — K219 Gastro-esophageal reflux disease without esophagitis: Secondary | ICD-10-CM | POA: Insufficient documentation

## 2012-02-12 DIAGNOSIS — Z5189 Encounter for other specified aftercare: Secondary | ICD-10-CM | POA: Insufficient documentation

## 2012-02-12 DIAGNOSIS — Z8546 Personal history of malignant neoplasm of prostate: Secondary | ICD-10-CM | POA: Insufficient documentation

## 2012-02-12 DIAGNOSIS — Z8249 Family history of ischemic heart disease and other diseases of the circulatory system: Secondary | ICD-10-CM | POA: Insufficient documentation

## 2012-02-12 DIAGNOSIS — I10 Essential (primary) hypertension: Secondary | ICD-10-CM | POA: Insufficient documentation

## 2012-02-12 DIAGNOSIS — Z951 Presence of aortocoronary bypass graft: Secondary | ICD-10-CM | POA: Insufficient documentation

## 2012-02-12 DIAGNOSIS — E785 Hyperlipidemia, unspecified: Secondary | ICD-10-CM | POA: Insufficient documentation

## 2012-02-13 ENCOUNTER — Encounter (HOSPITAL_COMMUNITY)
Admission: RE | Admit: 2012-02-13 | Discharge: 2012-02-13 | Disposition: A | Discharge status: Home / Self Care | Payer: Self-pay | Source: Ambulatory Visit | Attending: Cardiology | Admitting: Cardiology

## 2012-02-15 ENCOUNTER — Encounter (HOSPITAL_COMMUNITY): Payer: Self-pay

## 2012-02-19 ENCOUNTER — Encounter (HOSPITAL_COMMUNITY): Payer: Self-pay

## 2012-02-20 ENCOUNTER — Encounter (HOSPITAL_COMMUNITY): Payer: Self-pay

## 2012-02-22 ENCOUNTER — Encounter (HOSPITAL_COMMUNITY): Payer: Self-pay

## 2012-02-26 ENCOUNTER — Encounter (HOSPITAL_COMMUNITY)
Admission: RE | Admit: 2012-02-26 | Discharge: 2012-02-26 | Disposition: A | Discharge status: Home / Self Care | Payer: Self-pay | Source: Ambulatory Visit | Attending: Cardiology | Admitting: Cardiology

## 2012-02-27 ENCOUNTER — Encounter (HOSPITAL_COMMUNITY)
Admission: RE | Admit: 2012-02-27 | Discharge: 2012-02-27 | Disposition: A | Discharge status: Home / Self Care | Payer: Self-pay | Source: Ambulatory Visit | Attending: Cardiology | Admitting: Cardiology

## 2012-02-29 ENCOUNTER — Encounter (HOSPITAL_COMMUNITY): Payer: Self-pay

## 2012-03-04 ENCOUNTER — Encounter (HOSPITAL_COMMUNITY)
Admission: RE | Admit: 2012-03-04 | Discharge: 2012-03-04 | Disposition: A | Discharge status: Home / Self Care | Payer: Self-pay | Source: Ambulatory Visit | Attending: Cardiology | Admitting: Cardiology

## 2012-03-05 ENCOUNTER — Encounter (HOSPITAL_COMMUNITY)
Admission: RE | Admit: 2012-03-05 | Discharge: 2012-03-05 | Disposition: A | Discharge status: Home / Self Care | Payer: Self-pay | Source: Ambulatory Visit | Attending: Cardiology | Admitting: Cardiology

## 2012-03-07 ENCOUNTER — Encounter (HOSPITAL_COMMUNITY): Payer: Self-pay

## 2012-03-11 ENCOUNTER — Encounter (HOSPITAL_COMMUNITY)
Admission: RE | Admit: 2012-03-11 | Discharge: 2012-03-11 | Disposition: A | Discharge status: Home / Self Care | Payer: Self-pay | Source: Ambulatory Visit | Attending: Cardiology | Admitting: Cardiology

## 2012-03-11 DIAGNOSIS — Z5189 Encounter for other specified aftercare: Secondary | ICD-10-CM | POA: Insufficient documentation

## 2012-03-11 DIAGNOSIS — I2510 Atherosclerotic heart disease of native coronary artery without angina pectoris: Secondary | ICD-10-CM | POA: Insufficient documentation

## 2012-03-11 DIAGNOSIS — R002 Palpitations: Secondary | ICD-10-CM | POA: Insufficient documentation

## 2012-03-11 DIAGNOSIS — K219 Gastro-esophageal reflux disease without esophagitis: Secondary | ICD-10-CM | POA: Insufficient documentation

## 2012-03-11 DIAGNOSIS — Z951 Presence of aortocoronary bypass graft: Secondary | ICD-10-CM | POA: Insufficient documentation

## 2012-03-11 DIAGNOSIS — I10 Essential (primary) hypertension: Secondary | ICD-10-CM | POA: Insufficient documentation

## 2012-03-11 DIAGNOSIS — Z8546 Personal history of malignant neoplasm of prostate: Secondary | ICD-10-CM | POA: Insufficient documentation

## 2012-03-11 DIAGNOSIS — E039 Hypothyroidism, unspecified: Secondary | ICD-10-CM | POA: Insufficient documentation

## 2012-03-11 DIAGNOSIS — Z8249 Family history of ischemic heart disease and other diseases of the circulatory system: Secondary | ICD-10-CM | POA: Insufficient documentation

## 2012-03-11 DIAGNOSIS — E785 Hyperlipidemia, unspecified: Secondary | ICD-10-CM | POA: Insufficient documentation

## 2012-03-12 ENCOUNTER — Encounter (HOSPITAL_COMMUNITY): Payer: Self-pay

## 2012-03-14 ENCOUNTER — Encounter (HOSPITAL_COMMUNITY)
Admission: RE | Admit: 2012-03-14 | Discharge: 2012-03-14 | Disposition: A | Discharge status: Home / Self Care | Payer: Self-pay | Source: Ambulatory Visit | Attending: Cardiology | Admitting: Cardiology

## 2012-03-18 ENCOUNTER — Encounter (HOSPITAL_COMMUNITY)
Admission: RE | Admit: 2012-03-18 | Discharge: 2012-03-18 | Disposition: A | Discharge status: Home / Self Care | Payer: Self-pay | Source: Ambulatory Visit | Attending: Cardiology | Admitting: Cardiology

## 2012-03-19 ENCOUNTER — Encounter (HOSPITAL_COMMUNITY)
Admission: RE | Admit: 2012-03-19 | Discharge: 2012-03-19 | Disposition: A | Discharge status: Home / Self Care | Payer: Self-pay | Source: Ambulatory Visit | Attending: Cardiology | Admitting: Cardiology

## 2012-03-21 ENCOUNTER — Encounter (HOSPITAL_COMMUNITY): Payer: Self-pay

## 2012-03-25 ENCOUNTER — Encounter (HOSPITAL_COMMUNITY): Payer: Self-pay

## 2012-03-26 ENCOUNTER — Encounter (HOSPITAL_COMMUNITY): Payer: Self-pay

## 2012-03-28 ENCOUNTER — Encounter (HOSPITAL_COMMUNITY)
Admission: RE | Admit: 2012-03-28 | Discharge: 2012-03-28 | Disposition: A | Discharge status: Home / Self Care | Payer: Self-pay | Source: Ambulatory Visit | Attending: Cardiology | Admitting: Cardiology

## 2012-04-01 ENCOUNTER — Encounter (HOSPITAL_COMMUNITY)
Admission: RE | Admit: 2012-04-01 | Discharge: 2012-04-01 | Disposition: A | Discharge status: Home / Self Care | Payer: Self-pay | Source: Ambulatory Visit | Attending: Cardiology | Admitting: Cardiology

## 2012-04-02 ENCOUNTER — Encounter (HOSPITAL_COMMUNITY)
Admission: RE | Admit: 2012-04-02 | Discharge: 2012-04-02 | Disposition: A | Discharge status: Home / Self Care | Payer: Self-pay | Source: Ambulatory Visit | Attending: Cardiology | Admitting: Cardiology

## 2012-04-04 ENCOUNTER — Encounter (HOSPITAL_COMMUNITY)
Admission: RE | Admit: 2012-04-04 | Discharge: 2012-04-04 | Disposition: A | Discharge status: Home / Self Care | Payer: Self-pay | Source: Ambulatory Visit | Attending: Cardiology | Admitting: Cardiology

## 2012-04-08 ENCOUNTER — Encounter (HOSPITAL_COMMUNITY): Payer: Self-pay

## 2012-04-09 ENCOUNTER — Encounter (HOSPITAL_COMMUNITY): Payer: Self-pay

## 2012-04-09 DIAGNOSIS — Z951 Presence of aortocoronary bypass graft: Secondary | ICD-10-CM | POA: Insufficient documentation

## 2012-04-09 DIAGNOSIS — Z8546 Personal history of malignant neoplasm of prostate: Secondary | ICD-10-CM | POA: Insufficient documentation

## 2012-04-09 DIAGNOSIS — I2510 Atherosclerotic heart disease of native coronary artery without angina pectoris: Secondary | ICD-10-CM | POA: Insufficient documentation

## 2012-04-09 DIAGNOSIS — R002 Palpitations: Secondary | ICD-10-CM | POA: Insufficient documentation

## 2012-04-09 DIAGNOSIS — E785 Hyperlipidemia, unspecified: Secondary | ICD-10-CM | POA: Insufficient documentation

## 2012-04-09 DIAGNOSIS — K219 Gastro-esophageal reflux disease without esophagitis: Secondary | ICD-10-CM | POA: Insufficient documentation

## 2012-04-09 DIAGNOSIS — I10 Essential (primary) hypertension: Secondary | ICD-10-CM | POA: Insufficient documentation

## 2012-04-09 DIAGNOSIS — Z5189 Encounter for other specified aftercare: Secondary | ICD-10-CM | POA: Insufficient documentation

## 2012-04-09 DIAGNOSIS — E039 Hypothyroidism, unspecified: Secondary | ICD-10-CM | POA: Insufficient documentation

## 2012-04-09 DIAGNOSIS — Z8249 Family history of ischemic heart disease and other diseases of the circulatory system: Secondary | ICD-10-CM | POA: Insufficient documentation

## 2012-04-11 ENCOUNTER — Encounter (HOSPITAL_COMMUNITY): Payer: Self-pay

## 2012-04-15 ENCOUNTER — Encounter (HOSPITAL_COMMUNITY)
Admission: RE | Admit: 2012-04-15 | Discharge: 2012-04-15 | Disposition: A | Discharge status: Home / Self Care | Payer: Self-pay | Source: Ambulatory Visit | Attending: Cardiology | Admitting: Cardiology

## 2012-04-16 ENCOUNTER — Encounter (HOSPITAL_COMMUNITY)
Admission: RE | Admit: 2012-04-16 | Discharge: 2012-04-16 | Disposition: A | Discharge status: Home / Self Care | Payer: Self-pay | Source: Ambulatory Visit | Attending: Cardiology | Admitting: Cardiology

## 2012-04-18 ENCOUNTER — Encounter (HOSPITAL_COMMUNITY)
Admission: RE | Admit: 2012-04-18 | Discharge: 2012-04-18 | Disposition: A | Discharge status: Home / Self Care | Payer: Self-pay | Source: Ambulatory Visit | Attending: Cardiology | Admitting: Cardiology

## 2012-04-22 ENCOUNTER — Encounter (HOSPITAL_COMMUNITY)
Admission: RE | Admit: 2012-04-22 | Discharge: 2012-04-22 | Disposition: A | Discharge status: Home / Self Care | Payer: Self-pay | Source: Ambulatory Visit | Attending: Cardiology | Admitting: Cardiology

## 2012-04-23 ENCOUNTER — Encounter (HOSPITAL_COMMUNITY)
Admission: RE | Admit: 2012-04-23 | Discharge: 2012-04-23 | Disposition: A | Discharge status: Home / Self Care | Payer: Self-pay | Source: Ambulatory Visit | Attending: Cardiology | Admitting: Cardiology

## 2012-04-25 ENCOUNTER — Encounter (HOSPITAL_COMMUNITY): Payer: Self-pay

## 2012-04-29 ENCOUNTER — Encounter (HOSPITAL_COMMUNITY): Payer: Self-pay

## 2012-04-30 ENCOUNTER — Encounter (HOSPITAL_COMMUNITY)
Admission: RE | Admit: 2012-04-30 | Discharge: 2012-04-30 | Disposition: A | Discharge status: Home / Self Care | Payer: Self-pay | Source: Ambulatory Visit | Attending: Cardiology | Admitting: Cardiology

## 2012-05-02 ENCOUNTER — Encounter (HOSPITAL_COMMUNITY)
Admission: RE | Admit: 2012-05-02 | Discharge: 2012-05-02 | Disposition: A | Discharge status: Home / Self Care | Payer: Self-pay | Source: Ambulatory Visit | Attending: Cardiology | Admitting: Cardiology

## 2012-05-06 ENCOUNTER — Encounter (HOSPITAL_COMMUNITY)
Admission: RE | Admit: 2012-05-06 | Discharge: 2012-05-06 | Disposition: A | Discharge status: Home / Self Care | Payer: Self-pay | Source: Ambulatory Visit | Attending: Cardiology | Admitting: Cardiology

## 2012-05-07 ENCOUNTER — Encounter (HOSPITAL_COMMUNITY)
Admission: RE | Admit: 2012-05-07 | Discharge: 2012-05-07 | Disposition: A | Discharge status: Home / Self Care | Payer: Self-pay | Source: Ambulatory Visit | Attending: Cardiology | Admitting: Cardiology

## 2012-05-09 ENCOUNTER — Encounter (HOSPITAL_COMMUNITY): Payer: Self-pay

## 2012-05-09 DIAGNOSIS — R002 Palpitations: Secondary | ICD-10-CM | POA: Insufficient documentation

## 2012-05-09 DIAGNOSIS — I10 Essential (primary) hypertension: Secondary | ICD-10-CM | POA: Insufficient documentation

## 2012-05-09 DIAGNOSIS — Z951 Presence of aortocoronary bypass graft: Secondary | ICD-10-CM | POA: Insufficient documentation

## 2012-05-09 DIAGNOSIS — Z5189 Encounter for other specified aftercare: Secondary | ICD-10-CM | POA: Insufficient documentation

## 2012-05-09 DIAGNOSIS — I2510 Atherosclerotic heart disease of native coronary artery without angina pectoris: Secondary | ICD-10-CM | POA: Insufficient documentation

## 2012-05-09 DIAGNOSIS — K219 Gastro-esophageal reflux disease without esophagitis: Secondary | ICD-10-CM | POA: Insufficient documentation

## 2012-05-09 DIAGNOSIS — Z8546 Personal history of malignant neoplasm of prostate: Secondary | ICD-10-CM | POA: Insufficient documentation

## 2012-05-09 DIAGNOSIS — Z8249 Family history of ischemic heart disease and other diseases of the circulatory system: Secondary | ICD-10-CM | POA: Insufficient documentation

## 2012-05-09 DIAGNOSIS — E785 Hyperlipidemia, unspecified: Secondary | ICD-10-CM | POA: Insufficient documentation

## 2012-05-09 DIAGNOSIS — E039 Hypothyroidism, unspecified: Secondary | ICD-10-CM | POA: Insufficient documentation

## 2012-05-13 ENCOUNTER — Encounter (HOSPITAL_COMMUNITY): Payer: Self-pay

## 2012-05-14 ENCOUNTER — Encounter (HOSPITAL_COMMUNITY): Payer: Self-pay

## 2012-05-16 ENCOUNTER — Encounter (HOSPITAL_COMMUNITY)
Admission: RE | Admit: 2012-05-16 | Discharge: 2012-05-16 | Disposition: A | Discharge status: Home / Self Care | Payer: Self-pay | Source: Ambulatory Visit | Attending: Cardiology | Admitting: Cardiology

## 2012-05-20 ENCOUNTER — Encounter (HOSPITAL_COMMUNITY)
Admission: RE | Admit: 2012-05-20 | Discharge: 2012-05-20 | Disposition: A | Discharge status: Home / Self Care | Payer: Self-pay | Source: Ambulatory Visit | Attending: Cardiology | Admitting: Cardiology

## 2012-05-21 ENCOUNTER — Other Ambulatory Visit: Payer: Self-pay | Admitting: Family Medicine

## 2012-05-21 ENCOUNTER — Encounter (HOSPITAL_COMMUNITY)
Admission: RE | Admit: 2012-05-21 | Discharge: 2012-05-21 | Disposition: A | Discharge status: Home / Self Care | Payer: Self-pay | Source: Ambulatory Visit | Attending: Cardiology | Admitting: Cardiology

## 2012-05-21 DIAGNOSIS — R42 Dizziness and giddiness: Secondary | ICD-10-CM

## 2012-05-21 DIAGNOSIS — I2510 Atherosclerotic heart disease of native coronary artery without angina pectoris: Secondary | ICD-10-CM

## 2012-05-22 ENCOUNTER — Ambulatory Visit
Admission: RE | Admit: 2012-05-22 | Discharge: 2012-05-22 | Disposition: A | Discharge status: Home / Self Care | Payer: 59 | Source: Ambulatory Visit | Attending: Family Medicine | Admitting: Family Medicine

## 2012-05-22 DIAGNOSIS — I2510 Atherosclerotic heart disease of native coronary artery without angina pectoris: Secondary | ICD-10-CM

## 2012-05-22 DIAGNOSIS — R42 Dizziness and giddiness: Secondary | ICD-10-CM

## 2012-05-23 ENCOUNTER — Encounter (HOSPITAL_COMMUNITY): Payer: Self-pay

## 2012-05-27 ENCOUNTER — Encounter (HOSPITAL_COMMUNITY): Payer: Self-pay

## 2012-05-28 ENCOUNTER — Encounter (HOSPITAL_COMMUNITY)
Admission: RE | Admit: 2012-05-28 | Discharge: 2012-05-28 | Disposition: A | Discharge status: Home / Self Care | Payer: Self-pay | Source: Ambulatory Visit | Attending: Cardiology | Admitting: Cardiology

## 2012-05-29 ENCOUNTER — Encounter: Payer: Self-pay | Admitting: Neurology

## 2012-05-29 ENCOUNTER — Ambulatory Visit (INDEPENDENT_AMBULATORY_CARE_PROVIDER_SITE_OTHER): Payer: 59 | Admitting: Neurology

## 2012-05-29 VITALS — BP 145/81 | HR 60 | Ht 73.0 in | Wt 206.0 lb

## 2012-05-29 DIAGNOSIS — R200 Anesthesia of skin: Secondary | ICD-10-CM | POA: Insufficient documentation

## 2012-05-29 DIAGNOSIS — R5383 Other fatigue: Secondary | ICD-10-CM

## 2012-05-29 DIAGNOSIS — M542 Cervicalgia: Secondary | ICD-10-CM | POA: Insufficient documentation

## 2012-05-29 DIAGNOSIS — R42 Dizziness and giddiness: Secondary | ICD-10-CM | POA: Insufficient documentation

## 2012-05-29 DIAGNOSIS — R5381 Other malaise: Secondary | ICD-10-CM

## 2012-05-29 DIAGNOSIS — R209 Unspecified disturbances of skin sensation: Secondary | ICD-10-CM

## 2012-05-29 DIAGNOSIS — E785 Hyperlipidemia, unspecified: Secondary | ICD-10-CM | POA: Insufficient documentation

## 2012-05-29 DIAGNOSIS — R531 Weakness: Secondary | ICD-10-CM | POA: Insufficient documentation

## 2012-05-29 NOTE — Progress Notes (Signed)
History of present illness:  [name] is a 62 years old right-handed Caucasian male, referred by his primary care physician Dr. Lynann Bologna physician for evaluation of neck pain  He had a past medical history of hyperlipidemia, coronary artery disease, prostate cancer, open heart surgery in 2011, presented with neck pressure, choking sensation then,  He began to have neck pain in 2012 after playing  golf, was treated by chiropractor Dr. Mayford Knife, he had neck adjustment, and also neck contraction, his symptoms has much improved  In October 2013, he had one episode while bending down, he suddenly felt lightheadedness, dizziness, worsening neck pain, he had extensive cardiac evaluation, there was no significant abnormality  Since the initial episode, he also complains of lightheadedness, dizziness, unsteady sensation, especially when he walks, and turning his neck, he also has constant left side neck achy pain, sometimes left second and third finger numbness tingling,  He also describes episode of bilateral feet paresthesia when bending down lifting heavy object, he denies significant gait difficulty, normal bowel and bladder incontinence,  If he blows the leaves, wearing a heavy backpack with strips on his back, he felt neck pain    he also complains of bilateral buttock area numbness tingling if he drives for 2 hours   Review of Systems  Out of a complete 14 system review, the patient complains of only the following symptoms, and all other reviewed systems are negative.   Constitutional:   N/A Cardiovascular:  N/A Ear/Nose/Throat:  N/A Skin: N/A Eyes: N/A Respiratory: N/A Gastroitestinal: N/A    Hematology/Lymphatic:  N/A Endocrine:  N/A Musculoskeletal:N/A Allergy/Immunology: Allergies Neurological: Headaches, numbness, sleepiness, dizziness Psychiatric:    N/A  PHYSICAL EXAMINATOINS:  Generalized: In no acute distress  Neck: Supple, no carotid bruits   Cardiac: Regular  rate rhythm  Pulmonary: Clear to auscultation bilaterally  Musculoskeletal: No deformity  Neurological examination  Mentation: Alert oriented to time, place, history taking, and causual conversation  Cranial nerve II-XII: Pupils were equal round reactive to light extraocular movements were full, visual field were full on confrontational test. facial sensation and strength were normal. hearing was intact to finger rubbing bilaterally. Uvula tongue midline.  head turning and shoulder shrug and were normal and symmetric.Tongue protrusion into cheek strength was normal.  Motor: normal tone, bulk and strength.  Sensory: Intact to fine touch, pinprick, preserved vibratory sensation, and proprioception at toes.  Coordination: Normal finger to nose, heel-to-shin bilaterally there was no truncal ataxia  Gait: Rising up from seated position without assistance, normal stance, mild stiff gait,  able to perform tiptoe, and heel walking without difficulty.   Romberg signs: Negative  Deep tendon reflexes: Brachioradialis 2+/2+, biceps 2+/2+, triceps 2/2, patellar 3/3, Achilles 2/2, plantar responses were flexor bilaterally.  Assessment and plan: 62 years old right-handed Caucasian male, with worsening neck pain, since October 2013, on examination, he has hyperreflexia of the upper and lower extremity, left hand muscle atrophy, mild weakness in left intrinsic hand muscles  1, most suggestive of left cervical radiculopathy, and cervical spondylitic myelopathy  2. MRI of the cervical spine 3. EMG nerve conduction study

## 2012-05-30 ENCOUNTER — Encounter (HOSPITAL_COMMUNITY)
Admission: RE | Admit: 2012-05-30 | Discharge: 2012-05-30 | Disposition: A | Discharge status: Home / Self Care | Payer: Self-pay | Source: Ambulatory Visit | Attending: Cardiology | Admitting: Cardiology

## 2012-06-04 ENCOUNTER — Encounter (HOSPITAL_COMMUNITY)
Admission: RE | Admit: 2012-06-04 | Discharge: 2012-06-04 | Disposition: A | Discharge status: Home / Self Care | Payer: Self-pay | Source: Ambulatory Visit | Attending: Cardiology | Admitting: Cardiology

## 2012-06-06 ENCOUNTER — Encounter (HOSPITAL_COMMUNITY): Payer: Self-pay

## 2012-06-08 ENCOUNTER — Ambulatory Visit
Admission: RE | Admit: 2012-06-08 | Discharge: 2012-06-08 | Disposition: A | Discharge status: Home / Self Care | Payer: 59 | Source: Ambulatory Visit | Attending: Neurology | Admitting: Neurology

## 2012-06-08 DIAGNOSIS — R209 Unspecified disturbances of skin sensation: Secondary | ICD-10-CM

## 2012-06-08 DIAGNOSIS — M542 Cervicalgia: Secondary | ICD-10-CM

## 2012-06-08 DIAGNOSIS — R5381 Other malaise: Secondary | ICD-10-CM

## 2012-06-08 DIAGNOSIS — R200 Anesthesia of skin: Secondary | ICD-10-CM

## 2012-06-08 DIAGNOSIS — R5383 Other fatigue: Secondary | ICD-10-CM

## 2012-06-08 DIAGNOSIS — R531 Weakness: Secondary | ICD-10-CM

## 2012-06-10 ENCOUNTER — Encounter (HOSPITAL_COMMUNITY): Payer: Self-pay

## 2012-06-10 ENCOUNTER — Encounter (INDEPENDENT_AMBULATORY_CARE_PROVIDER_SITE_OTHER): Payer: 59

## 2012-06-10 ENCOUNTER — Ambulatory Visit (INDEPENDENT_AMBULATORY_CARE_PROVIDER_SITE_OTHER): Payer: 59 | Admitting: Neurology

## 2012-06-10 DIAGNOSIS — M542 Cervicalgia: Secondary | ICD-10-CM

## 2012-06-10 DIAGNOSIS — R200 Anesthesia of skin: Secondary | ICD-10-CM

## 2012-06-10 DIAGNOSIS — Z0289 Encounter for other administrative examinations: Secondary | ICD-10-CM

## 2012-06-10 DIAGNOSIS — R531 Weakness: Secondary | ICD-10-CM

## 2012-06-10 NOTE — Procedures (Signed)
History of present illness: 62 years old right-handed Caucasian male, had a few month history of intermittent neck pain, lightheaded noticed when getting up quickly, intermittent left arm numbness tingling,   On examination: Bilateral upper extremity motor and sensory examination was normal, deep tendon reflexes were present and symmetric  Nerve conduction study: Bilateral median, ulnar, sensory and motor responses were normal  Electromyography:  Selected needle examination was performed at left upper extremity muscles, left cervical paraspinal muscles  Needle examination of left extensor digitorum communis, pronator teres, biceps, triceps, was normal  There was no spontaneous activity at the left cervical paraspinal muscles, left C5, 6, 7  In conclusion:  This is a normal study, there is no electrodiagnostic evidence of left upper extremity neuropathy, or active left cervical radiculopathy.

## 2012-06-11 ENCOUNTER — Encounter (HOSPITAL_COMMUNITY)
Admission: RE | Admit: 2012-06-11 | Discharge: 2012-06-11 | Disposition: A | Discharge status: Home / Self Care | Payer: Self-pay | Source: Ambulatory Visit | Attending: Cardiology | Admitting: Cardiology

## 2012-06-11 ENCOUNTER — Encounter (HOSPITAL_COMMUNITY): Payer: Self-pay

## 2012-06-11 DIAGNOSIS — Z8249 Family history of ischemic heart disease and other diseases of the circulatory system: Secondary | ICD-10-CM | POA: Insufficient documentation

## 2012-06-11 DIAGNOSIS — I10 Essential (primary) hypertension: Secondary | ICD-10-CM | POA: Insufficient documentation

## 2012-06-11 DIAGNOSIS — E785 Hyperlipidemia, unspecified: Secondary | ICD-10-CM | POA: Insufficient documentation

## 2012-06-11 DIAGNOSIS — Z5189 Encounter for other specified aftercare: Secondary | ICD-10-CM | POA: Insufficient documentation

## 2012-06-11 DIAGNOSIS — R002 Palpitations: Secondary | ICD-10-CM | POA: Insufficient documentation

## 2012-06-11 DIAGNOSIS — K219 Gastro-esophageal reflux disease without esophagitis: Secondary | ICD-10-CM | POA: Insufficient documentation

## 2012-06-11 DIAGNOSIS — Z8546 Personal history of malignant neoplasm of prostate: Secondary | ICD-10-CM | POA: Insufficient documentation

## 2012-06-11 DIAGNOSIS — I2510 Atherosclerotic heart disease of native coronary artery without angina pectoris: Secondary | ICD-10-CM | POA: Insufficient documentation

## 2012-06-11 DIAGNOSIS — Z951 Presence of aortocoronary bypass graft: Secondary | ICD-10-CM | POA: Insufficient documentation

## 2012-06-11 DIAGNOSIS — E039 Hypothyroidism, unspecified: Secondary | ICD-10-CM | POA: Insufficient documentation

## 2012-06-13 ENCOUNTER — Encounter (HOSPITAL_COMMUNITY): Payer: Self-pay

## 2012-06-14 NOTE — Progress Notes (Signed)
Quick Note:  Please call him, MRI scan of the cervical spine showing mild disc degenrative changes from C3-C7 but without definite compression.  He should continue neck stretching exercise ______

## 2012-06-17 ENCOUNTER — Encounter (HOSPITAL_COMMUNITY)
Admission: RE | Admit: 2012-06-17 | Discharge: 2012-06-17 | Disposition: A | Discharge status: Home / Self Care | Payer: Self-pay | Source: Ambulatory Visit | Attending: Cardiology | Admitting: Cardiology

## 2012-06-17 ENCOUNTER — Encounter (HOSPITAL_COMMUNITY): Payer: Self-pay

## 2012-06-18 ENCOUNTER — Encounter (HOSPITAL_COMMUNITY): Payer: Self-pay

## 2012-06-18 ENCOUNTER — Encounter (HOSPITAL_COMMUNITY)
Admission: RE | Admit: 2012-06-18 | Discharge: 2012-06-18 | Disposition: A | Discharge status: Home / Self Care | Payer: Self-pay | Source: Ambulatory Visit | Attending: Cardiology | Admitting: Cardiology

## 2012-06-20 ENCOUNTER — Encounter (HOSPITAL_COMMUNITY): Payer: Self-pay

## 2012-06-24 ENCOUNTER — Encounter (HOSPITAL_COMMUNITY): Payer: Self-pay

## 2012-06-24 ENCOUNTER — Encounter (HOSPITAL_COMMUNITY)
Admission: RE | Admit: 2012-06-24 | Discharge: 2012-06-24 | Disposition: A | Discharge status: Home / Self Care | Payer: Self-pay | Source: Ambulatory Visit | Attending: Cardiology | Admitting: Cardiology

## 2012-06-25 ENCOUNTER — Encounter (HOSPITAL_COMMUNITY): Payer: Self-pay

## 2012-06-25 ENCOUNTER — Encounter (HOSPITAL_COMMUNITY)
Admission: RE | Admit: 2012-06-25 | Discharge: 2012-06-25 | Disposition: A | Discharge status: Home / Self Care | Payer: Self-pay | Source: Ambulatory Visit | Attending: Cardiology | Admitting: Cardiology

## 2012-06-27 ENCOUNTER — Encounter (HOSPITAL_COMMUNITY): Payer: Self-pay

## 2012-07-01 ENCOUNTER — Encounter (HOSPITAL_COMMUNITY)
Admission: RE | Admit: 2012-07-01 | Discharge: 2012-07-01 | Disposition: A | Discharge status: Home / Self Care | Payer: Self-pay | Source: Ambulatory Visit | Attending: Cardiology | Admitting: Cardiology

## 2012-07-01 ENCOUNTER — Encounter (HOSPITAL_COMMUNITY): Payer: Self-pay

## 2012-07-02 ENCOUNTER — Encounter (HOSPITAL_COMMUNITY): Payer: Self-pay

## 2012-07-02 ENCOUNTER — Encounter (HOSPITAL_COMMUNITY)
Admission: RE | Admit: 2012-07-02 | Discharge: 2012-07-02 | Disposition: A | Discharge status: Home / Self Care | Payer: Self-pay | Source: Ambulatory Visit | Attending: Cardiology | Admitting: Cardiology

## 2012-07-04 ENCOUNTER — Encounter (HOSPITAL_COMMUNITY): Payer: Self-pay

## 2012-07-04 ENCOUNTER — Encounter (HOSPITAL_COMMUNITY)
Admission: RE | Admit: 2012-07-04 | Discharge: 2012-07-04 | Disposition: A | Discharge status: Home / Self Care | Payer: Self-pay | Source: Ambulatory Visit | Attending: Cardiology | Admitting: Cardiology

## 2012-07-08 ENCOUNTER — Encounter (HOSPITAL_COMMUNITY): Admission: RE | Admit: 2012-07-08 | Payer: Self-pay | Source: Ambulatory Visit

## 2012-07-08 ENCOUNTER — Encounter (HOSPITAL_COMMUNITY): Payer: Self-pay

## 2012-07-09 ENCOUNTER — Encounter (HOSPITAL_COMMUNITY): Payer: Self-pay

## 2012-07-09 DIAGNOSIS — Z8546 Personal history of malignant neoplasm of prostate: Secondary | ICD-10-CM | POA: Insufficient documentation

## 2012-07-09 DIAGNOSIS — I2510 Atherosclerotic heart disease of native coronary artery without angina pectoris: Secondary | ICD-10-CM | POA: Insufficient documentation

## 2012-07-09 DIAGNOSIS — E039 Hypothyroidism, unspecified: Secondary | ICD-10-CM | POA: Insufficient documentation

## 2012-07-09 DIAGNOSIS — Z8249 Family history of ischemic heart disease and other diseases of the circulatory system: Secondary | ICD-10-CM | POA: Insufficient documentation

## 2012-07-09 DIAGNOSIS — K219 Gastro-esophageal reflux disease without esophagitis: Secondary | ICD-10-CM | POA: Insufficient documentation

## 2012-07-09 DIAGNOSIS — E785 Hyperlipidemia, unspecified: Secondary | ICD-10-CM | POA: Insufficient documentation

## 2012-07-09 DIAGNOSIS — Z5189 Encounter for other specified aftercare: Secondary | ICD-10-CM | POA: Insufficient documentation

## 2012-07-09 DIAGNOSIS — Z951 Presence of aortocoronary bypass graft: Secondary | ICD-10-CM | POA: Insufficient documentation

## 2012-07-09 DIAGNOSIS — R002 Palpitations: Secondary | ICD-10-CM | POA: Insufficient documentation

## 2012-07-09 DIAGNOSIS — I10 Essential (primary) hypertension: Secondary | ICD-10-CM | POA: Insufficient documentation

## 2012-07-11 ENCOUNTER — Encounter (HOSPITAL_COMMUNITY): Payer: Self-pay

## 2012-07-15 ENCOUNTER — Encounter (HOSPITAL_COMMUNITY)
Admission: RE | Admit: 2012-07-15 | Discharge: 2012-07-15 | Disposition: A | Discharge status: Home / Self Care | Payer: Self-pay | Source: Ambulatory Visit | Attending: Cardiology | Admitting: Cardiology

## 2012-07-16 ENCOUNTER — Encounter (HOSPITAL_COMMUNITY)
Admission: RE | Admit: 2012-07-16 | Discharge: 2012-07-16 | Disposition: A | Discharge status: Home / Self Care | Payer: Self-pay | Source: Ambulatory Visit | Attending: Cardiology | Admitting: Cardiology

## 2012-07-18 ENCOUNTER — Encounter (HOSPITAL_COMMUNITY): Payer: Self-pay

## 2012-07-22 ENCOUNTER — Encounter (HOSPITAL_COMMUNITY): Payer: Self-pay

## 2012-07-23 ENCOUNTER — Encounter (HOSPITAL_COMMUNITY): Payer: Self-pay

## 2012-07-25 ENCOUNTER — Encounter (HOSPITAL_COMMUNITY)
Admission: RE | Admit: 2012-07-25 | Discharge: 2012-07-25 | Disposition: A | Discharge status: Home / Self Care | Payer: Self-pay | Source: Ambulatory Visit | Attending: Cardiology | Admitting: Cardiology

## 2012-07-29 ENCOUNTER — Encounter (HOSPITAL_COMMUNITY): Payer: Self-pay

## 2012-07-30 ENCOUNTER — Encounter (HOSPITAL_COMMUNITY): Payer: Self-pay

## 2012-08-01 ENCOUNTER — Encounter (HOSPITAL_COMMUNITY): Payer: Self-pay

## 2012-08-05 ENCOUNTER — Encounter (HOSPITAL_COMMUNITY): Payer: Self-pay

## 2012-08-06 ENCOUNTER — Encounter (HOSPITAL_COMMUNITY): Payer: Self-pay

## 2012-08-08 ENCOUNTER — Encounter (HOSPITAL_COMMUNITY)
Admission: RE | Admit: 2012-08-08 | Discharge: 2012-08-08 | Disposition: A | Discharge status: Home / Self Care | Payer: Self-pay | Source: Ambulatory Visit | Attending: Cardiology | Admitting: Cardiology

## 2012-08-12 ENCOUNTER — Encounter (HOSPITAL_COMMUNITY): Payer: Self-pay

## 2012-08-12 DIAGNOSIS — I2510 Atherosclerotic heart disease of native coronary artery without angina pectoris: Secondary | ICD-10-CM | POA: Insufficient documentation

## 2012-08-12 DIAGNOSIS — K219 Gastro-esophageal reflux disease without esophagitis: Secondary | ICD-10-CM | POA: Insufficient documentation

## 2012-08-12 DIAGNOSIS — E785 Hyperlipidemia, unspecified: Secondary | ICD-10-CM | POA: Insufficient documentation

## 2012-08-12 DIAGNOSIS — I10 Essential (primary) hypertension: Secondary | ICD-10-CM | POA: Insufficient documentation

## 2012-08-12 DIAGNOSIS — Z8546 Personal history of malignant neoplasm of prostate: Secondary | ICD-10-CM | POA: Insufficient documentation

## 2012-08-12 DIAGNOSIS — Z5189 Encounter for other specified aftercare: Secondary | ICD-10-CM | POA: Insufficient documentation

## 2012-08-12 DIAGNOSIS — E039 Hypothyroidism, unspecified: Secondary | ICD-10-CM | POA: Insufficient documentation

## 2012-08-12 DIAGNOSIS — Z951 Presence of aortocoronary bypass graft: Secondary | ICD-10-CM | POA: Insufficient documentation

## 2012-08-12 DIAGNOSIS — Z8249 Family history of ischemic heart disease and other diseases of the circulatory system: Secondary | ICD-10-CM | POA: Insufficient documentation

## 2012-08-12 DIAGNOSIS — R002 Palpitations: Secondary | ICD-10-CM | POA: Insufficient documentation

## 2012-08-13 ENCOUNTER — Encounter (HOSPITAL_COMMUNITY)
Admission: RE | Admit: 2012-08-13 | Discharge: 2012-08-13 | Disposition: A | Discharge status: Home / Self Care | Payer: Self-pay | Source: Ambulatory Visit | Attending: Cardiology | Admitting: Cardiology

## 2012-08-15 ENCOUNTER — Encounter (HOSPITAL_COMMUNITY): Payer: Self-pay

## 2012-08-19 ENCOUNTER — Encounter (HOSPITAL_COMMUNITY)
Admission: RE | Admit: 2012-08-19 | Discharge: 2012-08-19 | Disposition: A | Discharge status: Home / Self Care | Payer: Self-pay | Source: Ambulatory Visit | Attending: Cardiology | Admitting: Cardiology

## 2012-08-20 ENCOUNTER — Encounter (HOSPITAL_COMMUNITY): Payer: Self-pay

## 2012-08-22 ENCOUNTER — Encounter (HOSPITAL_COMMUNITY)
Admission: RE | Admit: 2012-08-22 | Discharge: 2012-08-22 | Disposition: A | Discharge status: Home / Self Care | Payer: Self-pay | Source: Ambulatory Visit | Attending: Cardiology | Admitting: Cardiology

## 2012-08-26 ENCOUNTER — Encounter (HOSPITAL_COMMUNITY): Payer: Self-pay

## 2012-08-27 ENCOUNTER — Encounter (HOSPITAL_COMMUNITY)
Admission: RE | Admit: 2012-08-27 | Discharge: 2012-08-27 | Disposition: A | Discharge status: Home / Self Care | Payer: Self-pay | Source: Ambulatory Visit | Attending: Cardiology | Admitting: Cardiology

## 2012-08-29 ENCOUNTER — Encounter (HOSPITAL_COMMUNITY): Payer: Self-pay

## 2012-09-02 ENCOUNTER — Encounter (HOSPITAL_COMMUNITY): Payer: Self-pay

## 2012-09-03 ENCOUNTER — Encounter (HOSPITAL_COMMUNITY)
Admission: RE | Admit: 2012-09-03 | Discharge: 2012-09-03 | Disposition: A | Discharge status: Home / Self Care | Payer: Self-pay | Source: Ambulatory Visit | Attending: Cardiology | Admitting: Cardiology

## 2012-09-05 ENCOUNTER — Encounter (HOSPITAL_COMMUNITY): Payer: Self-pay

## 2012-09-10 ENCOUNTER — Encounter (HOSPITAL_COMMUNITY): Payer: 59

## 2012-09-10 DIAGNOSIS — K219 Gastro-esophageal reflux disease without esophagitis: Secondary | ICD-10-CM | POA: Insufficient documentation

## 2012-09-10 DIAGNOSIS — E039 Hypothyroidism, unspecified: Secondary | ICD-10-CM | POA: Insufficient documentation

## 2012-09-10 DIAGNOSIS — I2510 Atherosclerotic heart disease of native coronary artery without angina pectoris: Secondary | ICD-10-CM | POA: Insufficient documentation

## 2012-09-10 DIAGNOSIS — Z8249 Family history of ischemic heart disease and other diseases of the circulatory system: Secondary | ICD-10-CM | POA: Insufficient documentation

## 2012-09-10 DIAGNOSIS — Z5189 Encounter for other specified aftercare: Secondary | ICD-10-CM | POA: Insufficient documentation

## 2012-09-10 DIAGNOSIS — Z951 Presence of aortocoronary bypass graft: Secondary | ICD-10-CM | POA: Insufficient documentation

## 2012-09-10 DIAGNOSIS — Z8546 Personal history of malignant neoplasm of prostate: Secondary | ICD-10-CM | POA: Insufficient documentation

## 2012-09-10 DIAGNOSIS — R002 Palpitations: Secondary | ICD-10-CM | POA: Insufficient documentation

## 2012-09-10 DIAGNOSIS — I10 Essential (primary) hypertension: Secondary | ICD-10-CM | POA: Insufficient documentation

## 2012-09-10 DIAGNOSIS — E785 Hyperlipidemia, unspecified: Secondary | ICD-10-CM | POA: Insufficient documentation

## 2012-09-12 ENCOUNTER — Encounter (HOSPITAL_COMMUNITY): Payer: 59

## 2012-09-16 ENCOUNTER — Encounter (HOSPITAL_COMMUNITY): Payer: 59

## 2012-09-17 ENCOUNTER — Encounter (HOSPITAL_COMMUNITY): Payer: 59

## 2012-09-19 ENCOUNTER — Encounter (HOSPITAL_COMMUNITY): Payer: 59

## 2012-09-23 ENCOUNTER — Encounter (HOSPITAL_COMMUNITY): Payer: 59

## 2012-09-24 ENCOUNTER — Encounter (HOSPITAL_COMMUNITY)
Admission: RE | Admit: 2012-09-24 | Discharge: 2012-09-24 | Disposition: A | Discharge status: Home / Self Care | Payer: Self-pay | Source: Ambulatory Visit | Attending: Cardiology | Admitting: Cardiology

## 2012-09-26 ENCOUNTER — Encounter (HOSPITAL_COMMUNITY)
Admission: RE | Admit: 2012-09-26 | Discharge: 2012-09-26 | Disposition: A | Discharge status: Home / Self Care | Payer: Self-pay | Source: Ambulatory Visit | Attending: Cardiology | Admitting: Cardiology

## 2012-09-30 ENCOUNTER — Encounter (HOSPITAL_COMMUNITY): Payer: 59

## 2012-10-01 ENCOUNTER — Encounter (HOSPITAL_COMMUNITY)
Admission: RE | Admit: 2012-10-01 | Discharge: 2012-10-01 | Disposition: A | Discharge status: Home / Self Care | Payer: Self-pay | Source: Ambulatory Visit | Attending: Cardiology | Admitting: Cardiology

## 2012-10-03 ENCOUNTER — Encounter (HOSPITAL_COMMUNITY)
Admission: RE | Admit: 2012-10-03 | Discharge: 2012-10-03 | Disposition: A | Discharge status: Home / Self Care | Payer: Self-pay | Source: Ambulatory Visit | Attending: Cardiology | Admitting: Cardiology

## 2012-10-03 ENCOUNTER — Encounter: Payer: Self-pay | Admitting: Neurology

## 2012-10-03 ENCOUNTER — Ambulatory Visit (INDEPENDENT_AMBULATORY_CARE_PROVIDER_SITE_OTHER): Payer: 59 | Admitting: Neurology

## 2012-10-03 VITALS — BP 120/73 | HR 60 | Ht 72.0 in | Wt 205.0 lb

## 2012-10-03 DIAGNOSIS — M542 Cervicalgia: Secondary | ICD-10-CM

## 2012-10-03 DIAGNOSIS — E785 Hyperlipidemia, unspecified: Secondary | ICD-10-CM

## 2012-10-03 DIAGNOSIS — R531 Weakness: Secondary | ICD-10-CM

## 2012-10-03 DIAGNOSIS — R5381 Other malaise: Secondary | ICD-10-CM

## 2012-10-03 DIAGNOSIS — R209 Unspecified disturbances of skin sensation: Secondary | ICD-10-CM

## 2012-10-03 DIAGNOSIS — R200 Anesthesia of skin: Secondary | ICD-10-CM

## 2012-10-03 NOTE — Progress Notes (Signed)
History of present illness:  [name] is a 62 years old right-handed Caucasian male, referred by his primary care physician Dr. Lynann Morse physician for evaluation of neck pain  He had a past medical history of hyperlipidemia, coronary artery disease, prostate cancer, open heart surgery in 2011, presented with neck pressure, choking sensation then,  He began to have neck pain in 2012 after playing  golf, was treated by chiropractor Dr. Mayford Morse, he had neck adjustment, and also neck contraction, his symptoms has much improved  In October 2013, he had one episode while bending down, he suddenly felt lightheadedness, dizziness, worsening neck pain, he had extensive cardiac evaluation, there was no significant abnormality  Since the initial episode, he also complains of lightheadedness, dizziness, unsteady sensation, especially when he walks, and turning his neck, he also has constant left side neck achy pain, sometimes left second and third finger numbness tingling,  He also describes episode of bilateral feet paresthesia when bending down lifting heavy object, he denies significant gait difficulty, normal bowel and bladder incontinence,  If he blows the leaves, wearing a heavy backpack with strips on his back, he felt neck pain    he also complains of bilateral buttock area numbness tingling if he drives for 2 hours  UPDATE Sept 25th 2014:  He had physical therapy, decompression, muscles massage, neck stretching exercise, which has helped his neck pain.  We have reviewed MRI cervical showed: C3-4 shows slight asymmetric disc bulge to the left with slight narrowing of the foramina but without definite encroachment, C5-6 and C4-5 shows slight asymmetric lateral disc bulges with mild foraminal narrowing without definite encroachment. C6-7 shows broad-based disc protrusion with mild effacement of the thecal sac ventrally but without definite compression. T  He went on a golf trip, played 4  days in a roll without difficulties, he has no gait difficulty, he denies bowel and bladder incontinence   Review of Systems  Out of a complete 14 system review, the patient complains of only the following symptoms, and all other reviewed systems are negative.   Constitutional:   N/A Cardiovascular:  N/A Ear/Nose/Throat:  N/A Skin: N/A Eyes: N/A Respiratory: N/A Gastroitestinal: N/A    Hematology/Lymphatic:  N/A Endocrine:  N/A Musculoskeletal:N/A Allergy/Immunology: Allergies Neurological: Headaches, numbness, sleepiness, dizziness Psychiatric:    N/A  PHYSICAL EXAMINATOINS:  Generalized: In no acute distress  Neck: Supple, no carotid bruits   Cardiac: Regular rate rhythm  Pulmonary: Clear to auscultation bilaterally  Musculoskeletal: No deformity  Neurological examination  Mentation: Alert oriented to time, place, history taking, and causual conversation  Cranial nerve II-XII: Pupils were equal round reactive to light extraocular movements were full, visual field were full on confrontational test. facial sensation and strength were normal. hearing was intact to finger rubbing bilaterally. Uvula tongue midline.  head turning and shoulder shrug and were normal and symmetric.Tongue protrusion into cheek strength was normal.  Motor: normal tone, bulk and strength.  Sensory: Intact to fine touch, pinprick, preserved vibratory sensation, and proprioception at toes.  Coordination: Normal finger to nose, heel-to-shin bilaterally there was no truncal ataxia  Gait: Rising up from seated position without assistance, normal stance, mild stiff gait,  able to perform tiptoe, and heel walking without difficulty.   Romberg signs: Negative  Deep tendon reflexes: Brachioradialis 2/2, biceps 2/2, triceps 2/2, patellar 2/2, Achilles 2/2, plantar responses were flexor bilaterally.  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

## 2012-10-07 ENCOUNTER — Encounter (HOSPITAL_COMMUNITY): Payer: 59

## 2012-10-08 ENCOUNTER — Encounter (HOSPITAL_COMMUNITY)
Admission: RE | Admit: 2012-10-08 | Discharge: 2012-10-08 | Disposition: A | Discharge status: Home / Self Care | Payer: Self-pay | Source: Ambulatory Visit | Attending: Cardiology | Admitting: Cardiology

## 2012-10-10 ENCOUNTER — Encounter (HOSPITAL_COMMUNITY): Payer: 59

## 2012-10-10 DIAGNOSIS — K219 Gastro-esophageal reflux disease without esophagitis: Secondary | ICD-10-CM | POA: Insufficient documentation

## 2012-10-10 DIAGNOSIS — E785 Hyperlipidemia, unspecified: Secondary | ICD-10-CM | POA: Insufficient documentation

## 2012-10-10 DIAGNOSIS — E039 Hypothyroidism, unspecified: Secondary | ICD-10-CM | POA: Insufficient documentation

## 2012-10-10 DIAGNOSIS — I10 Essential (primary) hypertension: Secondary | ICD-10-CM | POA: Insufficient documentation

## 2012-10-10 DIAGNOSIS — Z951 Presence of aortocoronary bypass graft: Secondary | ICD-10-CM | POA: Insufficient documentation

## 2012-10-10 DIAGNOSIS — Z8249 Family history of ischemic heart disease and other diseases of the circulatory system: Secondary | ICD-10-CM | POA: Insufficient documentation

## 2012-10-10 DIAGNOSIS — Z8546 Personal history of malignant neoplasm of prostate: Secondary | ICD-10-CM | POA: Insufficient documentation

## 2012-10-10 DIAGNOSIS — Z5189 Encounter for other specified aftercare: Secondary | ICD-10-CM | POA: Insufficient documentation

## 2012-10-10 DIAGNOSIS — I2510 Atherosclerotic heart disease of native coronary artery without angina pectoris: Secondary | ICD-10-CM | POA: Insufficient documentation

## 2012-10-10 DIAGNOSIS — R002 Palpitations: Secondary | ICD-10-CM | POA: Insufficient documentation

## 2012-10-14 ENCOUNTER — Encounter (HOSPITAL_COMMUNITY): Payer: 59

## 2012-10-15 ENCOUNTER — Encounter (HOSPITAL_COMMUNITY): Payer: 59

## 2012-10-17 ENCOUNTER — Encounter (HOSPITAL_COMMUNITY): Payer: 59

## 2012-10-21 ENCOUNTER — Encounter (HOSPITAL_COMMUNITY): Payer: 59

## 2012-10-22 ENCOUNTER — Encounter (HOSPITAL_COMMUNITY)
Admission: RE | Admit: 2012-10-22 | Discharge: 2012-10-22 | Disposition: A | Discharge status: Home / Self Care | Payer: Self-pay | Source: Ambulatory Visit | Attending: Cardiology | Admitting: Cardiology

## 2012-10-24 ENCOUNTER — Encounter (HOSPITAL_COMMUNITY)
Admission: RE | Admit: 2012-10-24 | Discharge: 2012-10-24 | Disposition: A | Discharge status: Home / Self Care | Payer: Self-pay | Source: Ambulatory Visit | Attending: Cardiology | Admitting: Cardiology

## 2012-10-28 ENCOUNTER — Encounter (HOSPITAL_COMMUNITY)
Admission: RE | Admit: 2012-10-28 | Discharge: 2012-10-28 | Disposition: A | Discharge status: Home / Self Care | Payer: Self-pay | Source: Ambulatory Visit | Attending: Cardiology | Admitting: Cardiology

## 2012-10-29 ENCOUNTER — Encounter (HOSPITAL_COMMUNITY)
Admission: RE | Admit: 2012-10-29 | Discharge: 2012-10-29 | Disposition: A | Discharge status: Home / Self Care | Payer: Self-pay | Source: Ambulatory Visit | Attending: Cardiology | Admitting: Cardiology

## 2012-10-31 ENCOUNTER — Encounter (HOSPITAL_COMMUNITY): Payer: 59

## 2012-11-04 ENCOUNTER — Encounter (HOSPITAL_COMMUNITY): Payer: 59

## 2012-11-05 ENCOUNTER — Encounter (HOSPITAL_COMMUNITY)
Admission: RE | Admit: 2012-11-05 | Discharge: 2012-11-05 | Disposition: A | Discharge status: Home / Self Care | Payer: Self-pay | Source: Ambulatory Visit | Attending: Cardiology | Admitting: Cardiology

## 2012-11-07 ENCOUNTER — Encounter (HOSPITAL_COMMUNITY): Payer: 59

## 2012-11-11 ENCOUNTER — Encounter (HOSPITAL_COMMUNITY)
Admission: RE | Admit: 2012-11-11 | Discharge: 2012-11-11 | Disposition: A | Discharge status: Home / Self Care | Payer: Self-pay | Source: Ambulatory Visit | Attending: Cardiology | Admitting: Cardiology

## 2012-11-11 DIAGNOSIS — Z5189 Encounter for other specified aftercare: Secondary | ICD-10-CM | POA: Insufficient documentation

## 2012-11-11 DIAGNOSIS — Z8249 Family history of ischemic heart disease and other diseases of the circulatory system: Secondary | ICD-10-CM | POA: Insufficient documentation

## 2012-11-11 DIAGNOSIS — I10 Essential (primary) hypertension: Secondary | ICD-10-CM | POA: Insufficient documentation

## 2012-11-11 DIAGNOSIS — I2510 Atherosclerotic heart disease of native coronary artery without angina pectoris: Secondary | ICD-10-CM | POA: Insufficient documentation

## 2012-11-11 DIAGNOSIS — K219 Gastro-esophageal reflux disease without esophagitis: Secondary | ICD-10-CM | POA: Insufficient documentation

## 2012-11-11 DIAGNOSIS — E785 Hyperlipidemia, unspecified: Secondary | ICD-10-CM | POA: Insufficient documentation

## 2012-11-11 DIAGNOSIS — Z8546 Personal history of malignant neoplasm of prostate: Secondary | ICD-10-CM | POA: Insufficient documentation

## 2012-11-11 DIAGNOSIS — R002 Palpitations: Secondary | ICD-10-CM | POA: Insufficient documentation

## 2012-11-11 DIAGNOSIS — E039 Hypothyroidism, unspecified: Secondary | ICD-10-CM | POA: Insufficient documentation

## 2012-11-11 DIAGNOSIS — Z951 Presence of aortocoronary bypass graft: Secondary | ICD-10-CM | POA: Insufficient documentation

## 2012-11-12 ENCOUNTER — Encounter (HOSPITAL_COMMUNITY): Payer: 59

## 2012-11-14 ENCOUNTER — Encounter (HOSPITAL_COMMUNITY)
Admission: RE | Admit: 2012-11-14 | Discharge: 2012-11-14 | Disposition: A | Discharge status: Home / Self Care | Payer: Self-pay | Source: Ambulatory Visit | Attending: Cardiology | Admitting: Cardiology

## 2012-11-18 ENCOUNTER — Encounter (HOSPITAL_COMMUNITY)
Admission: RE | Admit: 2012-11-18 | Discharge: 2012-11-18 | Disposition: A | Discharge status: Home / Self Care | Payer: Self-pay | Source: Ambulatory Visit | Attending: Cardiology | Admitting: Cardiology

## 2012-11-19 ENCOUNTER — Encounter (HOSPITAL_COMMUNITY)
Admission: RE | Admit: 2012-11-19 | Discharge: 2012-11-19 | Disposition: A | Discharge status: Home / Self Care | Payer: Self-pay | Source: Ambulatory Visit | Attending: Cardiology | Admitting: Cardiology

## 2012-11-21 ENCOUNTER — Encounter (HOSPITAL_COMMUNITY): Payer: 59

## 2012-11-25 ENCOUNTER — Encounter (HOSPITAL_COMMUNITY)
Admission: RE | Admit: 2012-11-25 | Discharge: 2012-11-25 | Disposition: A | Discharge status: Home / Self Care | Payer: Self-pay | Source: Ambulatory Visit | Attending: Cardiology | Admitting: Cardiology

## 2012-11-26 ENCOUNTER — Encounter (HOSPITAL_COMMUNITY)
Admission: RE | Admit: 2012-11-26 | Discharge: 2012-11-26 | Disposition: A | Discharge status: Home / Self Care | Payer: Self-pay | Source: Ambulatory Visit | Attending: Cardiology | Admitting: Cardiology

## 2012-11-28 ENCOUNTER — Encounter (HOSPITAL_COMMUNITY): Payer: 59

## 2012-12-02 ENCOUNTER — Encounter (HOSPITAL_COMMUNITY): Payer: 59

## 2012-12-03 ENCOUNTER — Encounter (HOSPITAL_COMMUNITY): Payer: 59

## 2012-12-09 ENCOUNTER — Encounter (HOSPITAL_COMMUNITY)
Admission: RE | Admit: 2012-12-09 | Discharge: 2012-12-09 | Disposition: A | Discharge status: Home / Self Care | Payer: Self-pay | Source: Ambulatory Visit | Attending: Cardiology | Admitting: Cardiology

## 2012-12-09 DIAGNOSIS — Z951 Presence of aortocoronary bypass graft: Secondary | ICD-10-CM | POA: Insufficient documentation

## 2012-12-09 DIAGNOSIS — Z8546 Personal history of malignant neoplasm of prostate: Secondary | ICD-10-CM | POA: Insufficient documentation

## 2012-12-09 DIAGNOSIS — Z8249 Family history of ischemic heart disease and other diseases of the circulatory system: Secondary | ICD-10-CM | POA: Insufficient documentation

## 2012-12-09 DIAGNOSIS — E039 Hypothyroidism, unspecified: Secondary | ICD-10-CM | POA: Insufficient documentation

## 2012-12-09 DIAGNOSIS — K219 Gastro-esophageal reflux disease without esophagitis: Secondary | ICD-10-CM | POA: Insufficient documentation

## 2012-12-09 DIAGNOSIS — E785 Hyperlipidemia, unspecified: Secondary | ICD-10-CM | POA: Insufficient documentation

## 2012-12-09 DIAGNOSIS — R002 Palpitations: Secondary | ICD-10-CM | POA: Insufficient documentation

## 2012-12-09 DIAGNOSIS — Z5189 Encounter for other specified aftercare: Secondary | ICD-10-CM | POA: Insufficient documentation

## 2012-12-09 DIAGNOSIS — I2510 Atherosclerotic heart disease of native coronary artery without angina pectoris: Secondary | ICD-10-CM | POA: Insufficient documentation

## 2012-12-09 DIAGNOSIS — I10 Essential (primary) hypertension: Secondary | ICD-10-CM | POA: Insufficient documentation

## 2012-12-10 ENCOUNTER — Encounter (HOSPITAL_COMMUNITY)
Admission: RE | Admit: 2012-12-10 | Discharge: 2012-12-10 | Disposition: A | Discharge status: Home / Self Care | Payer: Self-pay | Source: Ambulatory Visit | Attending: Cardiology | Admitting: Cardiology

## 2012-12-12 ENCOUNTER — Encounter (HOSPITAL_COMMUNITY)
Admission: RE | Admit: 2012-12-12 | Discharge: 2012-12-12 | Disposition: A | Discharge status: Home / Self Care | Payer: Self-pay | Source: Ambulatory Visit | Attending: Cardiology | Admitting: Cardiology

## 2012-12-16 ENCOUNTER — Encounter (HOSPITAL_COMMUNITY): Payer: 59

## 2012-12-17 ENCOUNTER — Encounter: Payer: Self-pay | Admitting: Cardiology

## 2012-12-17 ENCOUNTER — Encounter (HOSPITAL_COMMUNITY)
Admission: RE | Admit: 2012-12-17 | Discharge: 2012-12-17 | Disposition: A | Discharge status: Home / Self Care | Payer: Self-pay | Source: Ambulatory Visit | Attending: Cardiology | Admitting: Cardiology

## 2012-12-18 ENCOUNTER — Ambulatory Visit (INDEPENDENT_AMBULATORY_CARE_PROVIDER_SITE_OTHER): Payer: 59 | Admitting: Cardiology

## 2012-12-18 ENCOUNTER — Encounter: Payer: Self-pay | Admitting: Cardiology

## 2012-12-18 VITALS — BP 122/72 | HR 59 | Ht 73.0 in | Wt 210.0 lb

## 2012-12-18 DIAGNOSIS — I2510 Atherosclerotic heart disease of native coronary artery without angina pectoris: Secondary | ICD-10-CM

## 2012-12-18 DIAGNOSIS — E785 Hyperlipidemia, unspecified: Secondary | ICD-10-CM

## 2012-12-18 DIAGNOSIS — K219 Gastro-esophageal reflux disease without esophagitis: Secondary | ICD-10-CM | POA: Insufficient documentation

## 2012-12-18 HISTORY — DX: Atherosclerotic heart disease of native coronary artery without angina pectoris: I25.10

## 2012-12-18 MED ORDER — LISINOPRIL 5 MG PO TABS: 5.0000 mg | ORAL_TABLET | Freq: Every day | ORAL | Status: DC

## 2012-12-18 NOTE — Progress Notes (Signed)
[address] [place], [place]  [address] Phone: [phone] Fax:  [phone]  Date:  12/18/2012   ID:  Collin Morse, DOB [birth year], MRN [id number]  PCP:  Gretel Acre, MD   History of Present Illness: Collin Morse is a 62 y.o. male with prior two-vessel bypass, LIMA to LAD, SVG to circumflex 01/2009, hypertension, hyperlipidemia here for followup after symptoms of throat tightness. Previously was concerned about the symptoms however after taking Prevacid, he feels much better.  Had a golf outing and did well after the first day. Nuclear stress test October of 2013 following bypass was low risk.  I started isosorbide 30 mg but he only took for approximately 1 week, continued metoprolol 50 mg as well as lisinopril. Blood pressure at previous visit was elevated on repeat 160/84. However, recently at cardiac rehabilitation he has had blood pressures in the 98 range. Symptomatic.    Wt Readings from Last 3 Encounters:  12/18/12 210 lb (95.255 kg)  10/03/12 205 lb (92.987 kg)  05/29/12 206 lb (93.441 kg)     Past Medical History  Diagnosis Date  . HTN (hypertension) since 2008  . Hyperlipemia   . Hypothyroidism   . Hernia   . Dizziness and giddiness   . Coronary artery disease 2011  . Prostate cancer     Past Surgical History  Procedure Laterality Date  . Prostate surgery  2006  . Coronary artery bypass graft  2011    Current Outpatient Prescriptions  Medication Sig Dispense Refill  . aspirin 81 MG tablet Take 162 mg by mouth daily.       Marland Kitchen ezetimibe (ZETIA) 10 MG tablet Take 10 mg by mouth daily.      . lansoprazole (PREVACID) 15 MG capsule Take 15 mg by mouth as needed.       Marland Kitchen lisinopril (PRINIVIL,ZESTRIL) 10 MG tablet Take 10 mg by mouth daily.       . metoprolol succinate (TOPROL-XL) 25 MG 24 hr tablet Take 50 mg by mouth daily.      . metoprolol succinate (TOPROL-XL) 50 MG 24 hr tablet Take 50 mg by mouth daily. Take with or immediately  following a meal.      . Multiple Vitamins-Minerals (MULTIVITAMIN PO) Take by mouth daily.      . nitroGLYCERIN (NITROSTAT) 0.4 MG SL tablet Place 0.4 mg under the tongue every 5 (five) minutes as needed for chest pain.      Marland Kitchen omeprazole (PRILOSEC) 40 MG capsule Take 40 mg by mouth as needed.       . rosuvastatin (CRESTOR) 40 MG tablet Take 40 mg by mouth daily.       No current facility-administered medications for this visit.    Allergies:    Allergies  Allergen Reactions  . Sulfa Antibiotics   . Tetracyclines & Related     Social History:  The patient  reports that he has never smoked. He has never used smokeless tobacco. He reports that he drinks about 1.2 ounces of alcohol per week. He reports that he does not use illicit drugs.   ROS:  Please see the history of present illness.   No CP, SOB, no fevers.   PHYSICAL EXAM: VS:  BP 122/72  Pulse 59  Ht 6\' 1"  (1.854 m)  Wt 210 lb (95.255 kg)  BMI 27.71 kg/m2  SpO2 97% Well nourished, well developed, in no acute distress HEENT: normal Neck: no JVD Cardiac:  normal S1, S2; RRR; no murmur Lungs:  clear to auscultation bilaterally, no wheezing, rhonchi or rales Abd: soft, nontender, no hepatomegaly Ext: no edema Skin: warm and dry Neuro: no focal abnormalities noted  EKG:  None today    ASSESSMENT AND PLAN:  1. Coronary artery disease/angina-currently well controlled. Previous symptoms were relieved with Prevacid. He thinks that they were esophageal. Continue with current plan. 2. GERD-Prevacid 3. Hyperlipidemia-primary physician will continue to follow. No changes made in medications. 4. Hypertension-blood pressure at times has been in 98 systolic range. I will decrease his lisinopril to 5 mg once a day from 10. We will continue with metoprolol for antianginal support. 5. I will see him back in 6 months.  Signed, Donato Schultz, MD [facility]  12/18/2012 9:25 AM

## 2012-12-18 NOTE — Patient Instructions (Signed)
Your physician has recommended you make the following change in your medication:   1. Decrease Lisinopril to 5mg  once daily.  Your physician wants you to follow-up in: 6 months with Dr. Anne Fu. You will receive a reminder letter in the mail two months in advance. If you don't receive a letter, please call our office to schedule the follow-up appointment.

## 2012-12-19 ENCOUNTER — Encounter (HOSPITAL_COMMUNITY)
Admission: RE | Admit: 2012-12-19 | Discharge: 2012-12-19 | Disposition: A | Discharge status: Home / Self Care | Payer: Self-pay | Source: Ambulatory Visit | Attending: Cardiology | Admitting: Cardiology

## 2012-12-23 ENCOUNTER — Encounter (HOSPITAL_COMMUNITY): Payer: 59

## 2012-12-24 ENCOUNTER — Encounter (HOSPITAL_COMMUNITY): Payer: 59

## 2012-12-26 ENCOUNTER — Encounter (HOSPITAL_COMMUNITY): Payer: 59

## 2012-12-30 ENCOUNTER — Encounter (HOSPITAL_COMMUNITY): Payer: 59

## 2012-12-31 ENCOUNTER — Encounter (HOSPITAL_COMMUNITY)
Admission: RE | Admit: 2012-12-31 | Discharge: 2012-12-31 | Disposition: A | Discharge status: Home / Self Care | Payer: Self-pay | Source: Ambulatory Visit | Attending: Cardiology | Admitting: Cardiology

## 2013-01-06 ENCOUNTER — Encounter (HOSPITAL_COMMUNITY): Payer: 59

## 2013-01-07 ENCOUNTER — Encounter (HOSPITAL_COMMUNITY): Payer: 59

## 2013-01-13 ENCOUNTER — Encounter (HOSPITAL_COMMUNITY): Payer: 59

## 2013-01-13 DIAGNOSIS — E039 Hypothyroidism, unspecified: Secondary | ICD-10-CM | POA: Insufficient documentation

## 2013-01-13 DIAGNOSIS — Z8546 Personal history of malignant neoplasm of prostate: Secondary | ICD-10-CM | POA: Insufficient documentation

## 2013-01-13 DIAGNOSIS — Z8249 Family history of ischemic heart disease and other diseases of the circulatory system: Secondary | ICD-10-CM | POA: Insufficient documentation

## 2013-01-13 DIAGNOSIS — I2510 Atherosclerotic heart disease of native coronary artery without angina pectoris: Secondary | ICD-10-CM | POA: Insufficient documentation

## 2013-01-13 DIAGNOSIS — R002 Palpitations: Secondary | ICD-10-CM | POA: Insufficient documentation

## 2013-01-13 DIAGNOSIS — E785 Hyperlipidemia, unspecified: Secondary | ICD-10-CM | POA: Insufficient documentation

## 2013-01-13 DIAGNOSIS — Z5189 Encounter for other specified aftercare: Secondary | ICD-10-CM | POA: Insufficient documentation

## 2013-01-13 DIAGNOSIS — Z951 Presence of aortocoronary bypass graft: Secondary | ICD-10-CM | POA: Insufficient documentation

## 2013-01-13 DIAGNOSIS — I10 Essential (primary) hypertension: Secondary | ICD-10-CM | POA: Insufficient documentation

## 2013-01-13 DIAGNOSIS — K219 Gastro-esophageal reflux disease without esophagitis: Secondary | ICD-10-CM | POA: Insufficient documentation

## 2013-01-14 ENCOUNTER — Encounter (HOSPITAL_COMMUNITY): Payer: 59

## 2013-01-16 ENCOUNTER — Encounter (HOSPITAL_COMMUNITY): Payer: 59

## 2013-01-20 ENCOUNTER — Encounter (HOSPITAL_COMMUNITY): Payer: 59

## 2013-01-21 ENCOUNTER — Encounter (HOSPITAL_COMMUNITY): Payer: 59

## 2013-01-23 ENCOUNTER — Encounter (HOSPITAL_COMMUNITY): Payer: 59

## 2013-01-27 ENCOUNTER — Encounter (HOSPITAL_COMMUNITY)
Admission: RE | Admit: 2013-01-27 | Discharge: 2013-01-27 | Disposition: A | Discharge status: Home / Self Care | Payer: Self-pay | Source: Ambulatory Visit | Attending: Cardiology | Admitting: Cardiology

## 2013-01-28 ENCOUNTER — Encounter (HOSPITAL_COMMUNITY)
Admission: RE | Admit: 2013-01-28 | Discharge: 2013-01-28 | Disposition: A | Discharge status: Home / Self Care | Payer: Self-pay | Source: Ambulatory Visit | Attending: Cardiology | Admitting: Cardiology

## 2013-01-30 ENCOUNTER — Encounter (HOSPITAL_COMMUNITY): Payer: 59

## 2013-02-03 ENCOUNTER — Encounter (HOSPITAL_COMMUNITY)
Admission: RE | Admit: 2013-02-03 | Discharge: 2013-02-03 | Disposition: A | Discharge status: Home / Self Care | Payer: Self-pay | Source: Ambulatory Visit | Attending: Cardiology | Admitting: Cardiology

## 2013-02-04 ENCOUNTER — Encounter (HOSPITAL_COMMUNITY)
Admission: RE | Admit: 2013-02-04 | Discharge: 2013-02-04 | Disposition: A | Discharge status: Home / Self Care | Payer: Self-pay | Source: Ambulatory Visit | Attending: Cardiology | Admitting: Cardiology

## 2013-02-06 ENCOUNTER — Encounter (HOSPITAL_COMMUNITY): Payer: 59

## 2013-02-10 ENCOUNTER — Encounter (HOSPITAL_COMMUNITY)
Admission: RE | Admit: 2013-02-10 | Discharge: 2013-02-10 | Disposition: A | Discharge status: Home / Self Care | Payer: Self-pay | Source: Ambulatory Visit | Attending: Cardiology | Admitting: Cardiology

## 2013-02-10 DIAGNOSIS — E785 Hyperlipidemia, unspecified: Secondary | ICD-10-CM | POA: Insufficient documentation

## 2013-02-10 DIAGNOSIS — K219 Gastro-esophageal reflux disease without esophagitis: Secondary | ICD-10-CM | POA: Insufficient documentation

## 2013-02-10 DIAGNOSIS — Z8249 Family history of ischemic heart disease and other diseases of the circulatory system: Secondary | ICD-10-CM | POA: Insufficient documentation

## 2013-02-10 DIAGNOSIS — I2510 Atherosclerotic heart disease of native coronary artery without angina pectoris: Secondary | ICD-10-CM | POA: Insufficient documentation

## 2013-02-10 DIAGNOSIS — E039 Hypothyroidism, unspecified: Secondary | ICD-10-CM | POA: Insufficient documentation

## 2013-02-10 DIAGNOSIS — R002 Palpitations: Secondary | ICD-10-CM | POA: Insufficient documentation

## 2013-02-10 DIAGNOSIS — I10 Essential (primary) hypertension: Secondary | ICD-10-CM | POA: Insufficient documentation

## 2013-02-10 DIAGNOSIS — Z951 Presence of aortocoronary bypass graft: Secondary | ICD-10-CM | POA: Insufficient documentation

## 2013-02-10 DIAGNOSIS — Z8546 Personal history of malignant neoplasm of prostate: Secondary | ICD-10-CM | POA: Insufficient documentation

## 2013-02-10 DIAGNOSIS — Z5189 Encounter for other specified aftercare: Secondary | ICD-10-CM | POA: Insufficient documentation

## 2013-02-11 ENCOUNTER — Encounter (HOSPITAL_COMMUNITY): Payer: 59

## 2013-02-13 ENCOUNTER — Encounter (HOSPITAL_COMMUNITY): Payer: 59

## 2013-02-17 ENCOUNTER — Encounter (HOSPITAL_COMMUNITY): Payer: 59

## 2013-02-18 ENCOUNTER — Encounter (HOSPITAL_COMMUNITY): Payer: 59

## 2013-02-20 ENCOUNTER — Encounter (HOSPITAL_COMMUNITY): Payer: 59

## 2013-02-24 ENCOUNTER — Encounter (HOSPITAL_COMMUNITY): Payer: 59

## 2013-02-25 ENCOUNTER — Encounter (HOSPITAL_COMMUNITY): Payer: 59

## 2013-02-27 ENCOUNTER — Encounter (HOSPITAL_COMMUNITY): Payer: 59

## 2013-03-03 ENCOUNTER — Encounter (HOSPITAL_COMMUNITY): Payer: 59

## 2013-03-04 ENCOUNTER — Encounter (HOSPITAL_COMMUNITY): Payer: 59

## 2013-03-06 ENCOUNTER — Encounter (HOSPITAL_COMMUNITY): Payer: 59

## 2013-03-10 ENCOUNTER — Telehealth: Payer: Self-pay

## 2013-03-10 ENCOUNTER — Encounter (HOSPITAL_COMMUNITY)
Admission: RE | Admit: 2013-03-10 | Discharge: 2013-03-10 | Disposition: A | Discharge status: Home / Self Care | Payer: Self-pay | Source: Ambulatory Visit | Attending: Cardiology | Admitting: Cardiology

## 2013-03-10 DIAGNOSIS — Z8249 Family history of ischemic heart disease and other diseases of the circulatory system: Secondary | ICD-10-CM | POA: Insufficient documentation

## 2013-03-10 DIAGNOSIS — I10 Essential (primary) hypertension: Secondary | ICD-10-CM | POA: Insufficient documentation

## 2013-03-10 DIAGNOSIS — E785 Hyperlipidemia, unspecified: Secondary | ICD-10-CM | POA: Insufficient documentation

## 2013-03-10 DIAGNOSIS — R002 Palpitations: Secondary | ICD-10-CM | POA: Insufficient documentation

## 2013-03-10 DIAGNOSIS — I2510 Atherosclerotic heart disease of native coronary artery without angina pectoris: Secondary | ICD-10-CM | POA: Insufficient documentation

## 2013-03-10 DIAGNOSIS — Z8546 Personal history of malignant neoplasm of prostate: Secondary | ICD-10-CM | POA: Insufficient documentation

## 2013-03-10 DIAGNOSIS — Z5189 Encounter for other specified aftercare: Secondary | ICD-10-CM | POA: Insufficient documentation

## 2013-03-10 DIAGNOSIS — K219 Gastro-esophageal reflux disease without esophagitis: Secondary | ICD-10-CM | POA: Insufficient documentation

## 2013-03-10 DIAGNOSIS — E039 Hypothyroidism, unspecified: Secondary | ICD-10-CM | POA: Insufficient documentation

## 2013-03-10 DIAGNOSIS — Z951 Presence of aortocoronary bypass graft: Secondary | ICD-10-CM | POA: Insufficient documentation

## 2013-03-11 ENCOUNTER — Encounter (HOSPITAL_COMMUNITY)
Admission: RE | Admit: 2013-03-11 | Discharge: 2013-03-11 | Disposition: A | Discharge status: Home / Self Care | Payer: Self-pay | Source: Ambulatory Visit | Attending: Cardiology | Admitting: Cardiology

## 2013-03-11 ENCOUNTER — Other Ambulatory Visit: Payer: Self-pay

## 2013-03-11 MED ORDER — CLOPIDOGREL BISULFATE 75 MG PO TABS: 75.0000 mg | ORAL_TABLET | Freq: Once | ORAL | Status: DC

## 2013-03-11 NOTE — Telephone Encounter (Signed)
Plavix 75mg  PO QD. OK with me.  Collin Morse

## 2013-03-13 ENCOUNTER — Encounter (HOSPITAL_COMMUNITY): Payer: 59

## 2013-03-17 ENCOUNTER — Encounter (HOSPITAL_COMMUNITY)
Admission: RE | Admit: 2013-03-17 | Discharge: 2013-03-17 | Disposition: A | Discharge status: Home / Self Care | Payer: Self-pay | Source: Ambulatory Visit | Attending: Cardiology | Admitting: Cardiology

## 2013-03-18 ENCOUNTER — Encounter (HOSPITAL_COMMUNITY)
Admission: RE | Admit: 2013-03-18 | Discharge: 2013-03-18 | Disposition: A | Discharge status: Home / Self Care | Payer: Self-pay | Source: Ambulatory Visit | Attending: Cardiology | Admitting: Cardiology

## 2013-03-20 ENCOUNTER — Encounter (HOSPITAL_COMMUNITY): Payer: 59

## 2013-03-24 ENCOUNTER — Encounter (HOSPITAL_COMMUNITY)
Admission: RE | Admit: 2013-03-24 | Discharge: 2013-03-24 | Disposition: A | Discharge status: Home / Self Care | Payer: Self-pay | Source: Ambulatory Visit | Attending: Cardiology | Admitting: Cardiology

## 2013-03-25 ENCOUNTER — Encounter (HOSPITAL_COMMUNITY): Payer: 59

## 2013-03-27 ENCOUNTER — Encounter (HOSPITAL_COMMUNITY): Payer: 59

## 2013-03-31 ENCOUNTER — Encounter (HOSPITAL_COMMUNITY): Payer: 59

## 2013-04-01 ENCOUNTER — Encounter (HOSPITAL_COMMUNITY): Payer: 59

## 2013-04-03 ENCOUNTER — Encounter (HOSPITAL_COMMUNITY): Payer: 59

## 2013-04-07 ENCOUNTER — Encounter (HOSPITAL_COMMUNITY)
Admission: RE | Admit: 2013-04-07 | Discharge: 2013-04-07 | Disposition: A | Discharge status: Home / Self Care | Payer: Self-pay | Source: Ambulatory Visit | Attending: Cardiology | Admitting: Cardiology

## 2013-04-08 ENCOUNTER — Encounter (HOSPITAL_COMMUNITY)
Admission: RE | Admit: 2013-04-08 | Discharge: 2013-04-08 | Disposition: A | Discharge status: Home / Self Care | Payer: Self-pay | Source: Ambulatory Visit | Attending: Cardiology | Admitting: Cardiology

## 2013-04-10 ENCOUNTER — Encounter (HOSPITAL_COMMUNITY): Payer: 59

## 2013-04-10 DIAGNOSIS — Z8249 Family history of ischemic heart disease and other diseases of the circulatory system: Secondary | ICD-10-CM | POA: Insufficient documentation

## 2013-04-10 DIAGNOSIS — E785 Hyperlipidemia, unspecified: Secondary | ICD-10-CM | POA: Insufficient documentation

## 2013-04-10 DIAGNOSIS — Z5189 Encounter for other specified aftercare: Secondary | ICD-10-CM | POA: Insufficient documentation

## 2013-04-10 DIAGNOSIS — R002 Palpitations: Secondary | ICD-10-CM | POA: Insufficient documentation

## 2013-04-10 DIAGNOSIS — Z951 Presence of aortocoronary bypass graft: Secondary | ICD-10-CM | POA: Insufficient documentation

## 2013-04-10 DIAGNOSIS — I2510 Atherosclerotic heart disease of native coronary artery without angina pectoris: Secondary | ICD-10-CM | POA: Insufficient documentation

## 2013-04-10 DIAGNOSIS — Z8546 Personal history of malignant neoplasm of prostate: Secondary | ICD-10-CM | POA: Insufficient documentation

## 2013-04-10 DIAGNOSIS — K219 Gastro-esophageal reflux disease without esophagitis: Secondary | ICD-10-CM | POA: Insufficient documentation

## 2013-04-10 DIAGNOSIS — I10 Essential (primary) hypertension: Secondary | ICD-10-CM | POA: Insufficient documentation

## 2013-04-10 DIAGNOSIS — E039 Hypothyroidism, unspecified: Secondary | ICD-10-CM | POA: Insufficient documentation

## 2013-04-14 ENCOUNTER — Encounter (HOSPITAL_COMMUNITY): Payer: 59

## 2013-04-15 ENCOUNTER — Encounter (HOSPITAL_COMMUNITY)
Admission: RE | Admit: 2013-04-15 | Discharge: 2013-04-15 | Disposition: A | Discharge status: Home / Self Care | Payer: Self-pay | Source: Ambulatory Visit | Attending: Cardiology | Admitting: Cardiology

## 2013-04-17 ENCOUNTER — Encounter (HOSPITAL_COMMUNITY): Payer: 59

## 2013-04-21 ENCOUNTER — Encounter (HOSPITAL_COMMUNITY)
Admission: RE | Admit: 2013-04-21 | Discharge: 2013-04-21 | Disposition: A | Discharge status: Home / Self Care | Payer: Self-pay | Source: Ambulatory Visit | Attending: Cardiology | Admitting: Cardiology

## 2013-04-22 ENCOUNTER — Encounter (HOSPITAL_COMMUNITY)
Admission: RE | Admit: 2013-04-22 | Discharge: 2013-04-22 | Disposition: A | Discharge status: Home / Self Care | Payer: Self-pay | Source: Ambulatory Visit | Attending: Cardiology | Admitting: Cardiology

## 2013-04-24 ENCOUNTER — Encounter (HOSPITAL_COMMUNITY): Payer: 59

## 2013-04-28 ENCOUNTER — Encounter (HOSPITAL_COMMUNITY)
Admission: RE | Admit: 2013-04-28 | Discharge: 2013-04-28 | Disposition: A | Discharge status: Home / Self Care | Payer: Self-pay | Source: Ambulatory Visit | Attending: Cardiology | Admitting: Cardiology

## 2013-04-29 ENCOUNTER — Encounter (HOSPITAL_COMMUNITY)
Admission: RE | Admit: 2013-04-29 | Discharge: 2013-04-29 | Disposition: A | Discharge status: Home / Self Care | Payer: Self-pay | Source: Ambulatory Visit | Attending: Cardiology | Admitting: Cardiology

## 2013-05-01 ENCOUNTER — Encounter (HOSPITAL_COMMUNITY): Payer: 59

## 2013-05-02 ENCOUNTER — Other Ambulatory Visit: Payer: Self-pay

## 2013-05-02 MED ORDER — ROSUVASTATIN CALCIUM 40 MG PO TABS: 40.0000 mg | ORAL_TABLET | Freq: Every day | ORAL | Status: DC

## 2013-05-05 ENCOUNTER — Telehealth: Payer: Self-pay | Admitting: Cardiology

## 2013-05-05 ENCOUNTER — Encounter (HOSPITAL_COMMUNITY): Payer: 59

## 2013-05-05 DIAGNOSIS — E785 Hyperlipidemia, unspecified: Secondary | ICD-10-CM

## 2013-05-05 DIAGNOSIS — I2510 Atherosclerotic heart disease of native coronary artery without angina pectoris: Secondary | ICD-10-CM

## 2013-05-05 NOTE — Telephone Encounter (Signed)
Returned call to patient fasting lab, lipid,hepatic panels and hbga1c to be done 05/15/13.

## 2013-05-05 NOTE — Telephone Encounter (Signed)
Returned call to patient he stated he has a follow up appointment with Dr.Skains 06/30/13.Stated he would like fasting lab before he sees Dr.Skains.Stated he would like a hgba1c done also.Message sent to Dr.Skains lab order.

## 2013-05-05 NOTE — Telephone Encounter (Signed)
Reviewed

## 2013-05-05 NOTE — Telephone Encounter (Signed)
New Message:  Pt is asking if it is time for him to have his cholesterol, Liver and blood sugar checked.. Pt would like a call back from the nurse. I informed the pt he had no lab orders in his chart.

## 2013-05-06 ENCOUNTER — Encounter (HOSPITAL_COMMUNITY): Payer: 59

## 2013-05-08 ENCOUNTER — Encounter (HOSPITAL_COMMUNITY)
Admission: RE | Admit: 2013-05-08 | Discharge: 2013-05-08 | Disposition: A | Discharge status: Home / Self Care | Payer: Self-pay | Source: Ambulatory Visit | Attending: Cardiology | Admitting: Cardiology

## 2013-05-12 ENCOUNTER — Encounter (HOSPITAL_COMMUNITY): Payer: 59

## 2013-05-12 DIAGNOSIS — Z8249 Family history of ischemic heart disease and other diseases of the circulatory system: Secondary | ICD-10-CM | POA: Insufficient documentation

## 2013-05-12 DIAGNOSIS — Z5189 Encounter for other specified aftercare: Secondary | ICD-10-CM | POA: Insufficient documentation

## 2013-05-12 DIAGNOSIS — E039 Hypothyroidism, unspecified: Secondary | ICD-10-CM | POA: Insufficient documentation

## 2013-05-12 DIAGNOSIS — Z951 Presence of aortocoronary bypass graft: Secondary | ICD-10-CM | POA: Insufficient documentation

## 2013-05-12 DIAGNOSIS — I2510 Atherosclerotic heart disease of native coronary artery without angina pectoris: Secondary | ICD-10-CM | POA: Insufficient documentation

## 2013-05-12 DIAGNOSIS — I10 Essential (primary) hypertension: Secondary | ICD-10-CM | POA: Insufficient documentation

## 2013-05-12 DIAGNOSIS — R002 Palpitations: Secondary | ICD-10-CM | POA: Insufficient documentation

## 2013-05-12 DIAGNOSIS — E785 Hyperlipidemia, unspecified: Secondary | ICD-10-CM | POA: Insufficient documentation

## 2013-05-12 DIAGNOSIS — Z8546 Personal history of malignant neoplasm of prostate: Secondary | ICD-10-CM | POA: Insufficient documentation

## 2013-05-12 DIAGNOSIS — K219 Gastro-esophageal reflux disease without esophagitis: Secondary | ICD-10-CM | POA: Insufficient documentation

## 2013-05-13 ENCOUNTER — Encounter (HOSPITAL_COMMUNITY)
Admission: RE | Admit: 2013-05-13 | Discharge: 2013-05-13 | Disposition: A | Discharge status: Home / Self Care | Payer: Self-pay | Source: Ambulatory Visit | Attending: Cardiology | Admitting: Cardiology

## 2013-05-15 ENCOUNTER — Encounter (HOSPITAL_COMMUNITY): Payer: 59

## 2013-05-15 ENCOUNTER — Other Ambulatory Visit (INDEPENDENT_AMBULATORY_CARE_PROVIDER_SITE_OTHER): Payer: 59

## 2013-05-15 DIAGNOSIS — E785 Hyperlipidemia, unspecified: Secondary | ICD-10-CM

## 2013-05-15 DIAGNOSIS — I2510 Atherosclerotic heart disease of native coronary artery without angina pectoris: Secondary | ICD-10-CM

## 2013-05-15 LAB — LIPID PANEL
CHOL/HDL RATIO: 4
Cholesterol: 146 mg/dL (ref 0–200)
HDL: 41.6 mg/dL (ref >39.00)
LDL CALC: 74 mg/dL (ref 0–99)
TRIGLYCERIDES: 154 mg/dL — AB (ref 0.0–149.0)
VLDL: 30.8 mg/dL (ref 0.0–40.0)

## 2013-05-15 LAB — HEPATIC FUNCTION PANEL
ALBUMIN: 4.3 g/dL (ref 3.5–5.2)
ALK PHOS: 33 U/L — AB (ref 39–117)
ALT: 47 U/L (ref 0–53)
AST: 32 U/L (ref 0–37)
BILIRUBIN DIRECT: 0 mg/dL (ref 0.0–0.3)
BILIRUBIN TOTAL: 0.9 mg/dL (ref 0.2–1.2)
Total Protein: 7.1 g/dL (ref 6.0–8.3)

## 2013-05-15 LAB — HEMOGLOBIN A1C: Hgb A1c MFr Bld: 6 % (ref 4.6–6.5)

## 2013-05-19 ENCOUNTER — Encounter (HOSPITAL_COMMUNITY)
Admission: RE | Admit: 2013-05-19 | Discharge: 2013-05-19 | Disposition: A | Discharge status: Home / Self Care | Payer: Self-pay | Source: Ambulatory Visit | Attending: Cardiology | Admitting: Cardiology

## 2013-05-20 ENCOUNTER — Encounter (HOSPITAL_COMMUNITY)
Admission: RE | Admit: 2013-05-20 | Discharge: 2013-05-20 | Disposition: A | Discharge status: Home / Self Care | Payer: Self-pay | Source: Ambulatory Visit | Attending: Cardiology | Admitting: Cardiology

## 2013-05-22 ENCOUNTER — Encounter (HOSPITAL_COMMUNITY): Payer: 59

## 2013-05-26 ENCOUNTER — Encounter (HOSPITAL_COMMUNITY)
Admission: RE | Admit: 2013-05-26 | Discharge: 2013-05-26 | Disposition: A | Discharge status: Home / Self Care | Payer: Self-pay | Source: Ambulatory Visit | Attending: Cardiology | Admitting: Cardiology

## 2013-05-27 ENCOUNTER — Encounter (HOSPITAL_COMMUNITY): Payer: 59

## 2013-05-29 ENCOUNTER — Encounter (HOSPITAL_COMMUNITY): Payer: 59

## 2013-06-03 ENCOUNTER — Encounter (HOSPITAL_COMMUNITY)
Admission: RE | Admit: 2013-06-03 | Discharge: 2013-06-03 | Disposition: A | Discharge status: Home / Self Care | Payer: Self-pay | Source: Ambulatory Visit | Attending: Cardiology | Admitting: Cardiology

## 2013-06-05 ENCOUNTER — Encounter (HOSPITAL_COMMUNITY): Payer: 59

## 2013-06-09 ENCOUNTER — Encounter (HOSPITAL_COMMUNITY): Payer: Self-pay

## 2013-06-09 DIAGNOSIS — I2510 Atherosclerotic heart disease of native coronary artery without angina pectoris: Secondary | ICD-10-CM | POA: Insufficient documentation

## 2013-06-09 DIAGNOSIS — E785 Hyperlipidemia, unspecified: Secondary | ICD-10-CM | POA: Insufficient documentation

## 2013-06-09 DIAGNOSIS — Z5189 Encounter for other specified aftercare: Secondary | ICD-10-CM | POA: Insufficient documentation

## 2013-06-10 ENCOUNTER — Encounter (HOSPITAL_COMMUNITY): Payer: Self-pay

## 2013-06-12 ENCOUNTER — Encounter (HOSPITAL_COMMUNITY): Payer: Self-pay

## 2013-06-16 ENCOUNTER — Encounter (HOSPITAL_COMMUNITY): Payer: Self-pay

## 2013-06-17 ENCOUNTER — Encounter (HOSPITAL_COMMUNITY): Payer: Self-pay

## 2013-06-19 ENCOUNTER — Encounter (HOSPITAL_COMMUNITY): Payer: Self-pay

## 2013-06-23 ENCOUNTER — Encounter (HOSPITAL_COMMUNITY)
Admission: RE | Admit: 2013-06-23 | Discharge: 2013-06-23 | Disposition: A | Discharge status: Home / Self Care | Payer: Self-pay | Source: Ambulatory Visit | Attending: Cardiology | Admitting: Cardiology

## 2013-06-24 ENCOUNTER — Encounter (HOSPITAL_COMMUNITY): Payer: Self-pay

## 2013-06-26 ENCOUNTER — Other Ambulatory Visit: Payer: Self-pay | Admitting: *Deleted

## 2013-06-26 ENCOUNTER — Encounter (HOSPITAL_COMMUNITY): Payer: Self-pay

## 2013-06-26 MED ORDER — EZETIMIBE 10 MG PO TABS: 10.0000 mg | ORAL_TABLET | Freq: Every day | ORAL | Status: DC

## 2013-06-26 MED ORDER — LISINOPRIL 5 MG PO TABS: 5.0000 mg | ORAL_TABLET | Freq: Every day | ORAL | Status: DC

## 2013-06-30 ENCOUNTER — Encounter (HOSPITAL_COMMUNITY): Payer: Self-pay

## 2013-06-30 ENCOUNTER — Ambulatory Visit: Payer: 59 | Admitting: Cardiology

## 2013-07-01 ENCOUNTER — Encounter (HOSPITAL_COMMUNITY)
Admission: RE | Admit: 2013-07-01 | Discharge: 2013-07-01 | Disposition: A | Discharge status: Home / Self Care | Payer: Self-pay | Source: Ambulatory Visit | Attending: Cardiology | Admitting: Cardiology

## 2013-07-03 ENCOUNTER — Encounter (HOSPITAL_COMMUNITY): Payer: Self-pay

## 2013-07-07 ENCOUNTER — Encounter (HOSPITAL_COMMUNITY)
Admission: RE | Admit: 2013-07-07 | Discharge: 2013-07-07 | Disposition: A | Discharge status: Home / Self Care | Payer: Self-pay | Source: Ambulatory Visit | Attending: Cardiology | Admitting: Cardiology

## 2013-07-08 ENCOUNTER — Encounter (HOSPITAL_COMMUNITY): Payer: Self-pay

## 2013-07-10 ENCOUNTER — Encounter (HOSPITAL_COMMUNITY): Payer: Self-pay

## 2013-07-10 DIAGNOSIS — Z5189 Encounter for other specified aftercare: Secondary | ICD-10-CM | POA: Insufficient documentation

## 2013-07-10 DIAGNOSIS — E785 Hyperlipidemia, unspecified: Secondary | ICD-10-CM | POA: Insufficient documentation

## 2013-07-10 DIAGNOSIS — I2510 Atherosclerotic heart disease of native coronary artery without angina pectoris: Secondary | ICD-10-CM | POA: Insufficient documentation

## 2013-07-14 ENCOUNTER — Encounter (HOSPITAL_COMMUNITY)
Admission: RE | Admit: 2013-07-14 | Discharge: 2013-07-14 | Disposition: A | Discharge status: Home / Self Care | Payer: Self-pay | Source: Ambulatory Visit | Attending: Cardiology | Admitting: Cardiology

## 2013-07-15 ENCOUNTER — Ambulatory Visit (INDEPENDENT_AMBULATORY_CARE_PROVIDER_SITE_OTHER): Payer: 59 | Admitting: Cardiology

## 2013-07-15 ENCOUNTER — Encounter (HOSPITAL_COMMUNITY)
Admission: RE | Admit: 2013-07-15 | Discharge: 2013-07-15 | Disposition: A | Discharge status: Home / Self Care | Payer: Self-pay | Source: Ambulatory Visit | Attending: Cardiology | Admitting: Cardiology

## 2013-07-15 ENCOUNTER — Encounter: Payer: Self-pay | Admitting: Cardiology

## 2013-07-15 VITALS — BP 130/82 | HR 53 | Ht 73.0 in | Wt 208.0 lb

## 2013-07-15 DIAGNOSIS — I10 Essential (primary) hypertension: Secondary | ICD-10-CM | POA: Insufficient documentation

## 2013-07-15 DIAGNOSIS — I2510 Atherosclerotic heart disease of native coronary artery without angina pectoris: Secondary | ICD-10-CM

## 2013-07-15 DIAGNOSIS — E785 Hyperlipidemia, unspecified: Secondary | ICD-10-CM

## 2013-07-15 DIAGNOSIS — K219 Gastro-esophageal reflux disease without esophagitis: Secondary | ICD-10-CM

## 2013-07-15 NOTE — Progress Notes (Signed)
[place]. [address], [address] [place], [place]  [address] Phone: [phone] Fax:  [phone]  Date:  07/15/2013   ID:  Collin Morse, DOB [birth year], MRN [id number]  PCP:  Gavin Pound, MD   History of Present Illness: Collin Morse is a 63 y.o. male with prior two-vessel bypass, LIMA to LAD, SVG to circumflex 01/2009, hypertension, hyperlipidemia here for followup after symptoms of throat tightness. Previously was concerned about the symptoms however after taking Prevacid, he feels much better.  Had a golf outing and did well after the first day. Nuclear stress test October of 2013 following bypass was low risk.  Overall he is doing very well. Enjoying golf 3 days a week when he can, cardiac maintenance/rehabilitation. No symptoms.  He takes Plavix when he needs to stop his aspirin for allergy shots on occasion. He does not take Plavix on a daily basis.  Wt Readings from Last 3 Encounters:  07/15/13 208 lb (94.348 kg)  12/18/12 210 lb (95.255 kg)  10/03/12 205 lb (92.987 kg)     Past Medical History  Diagnosis Date  . HTN (hypertension) since 2008  . Hyperlipemia   . Hypothyroidism   . Hernia   . Dizziness and giddiness   . Coronary artery disease 2011  . Prostate cancer   . Coronary atherosclerosis of native coronary artery 12/18/2012    CABG LIMA to LAD, SVG to circumflex 01/2009    Past Surgical History  Procedure Laterality Date  . Prostate surgery  2006  . Coronary artery bypass graft  2011    Current Outpatient Prescriptions  Medication Sig Dispense Refill  . aspirin 81 MG tablet Take 162 mg by mouth daily.       . clopidogrel (PLAVIX) 75 MG tablet Take 1 tablet (75 mg total) by mouth once.  90 tablet  3  . ezetimibe (ZETIA) 10 MG tablet Take 1 tablet (10 mg total) by mouth daily.  30 tablet  0  . lansoprazole (PREVACID) 15 MG capsule Take 15 mg by mouth as needed.       Marland Kitchen lisinopril (PRINIVIL,ZESTRIL) 5 MG tablet Take 1 tablet (5 mg total) by mouth  daily.  30 tablet  0  . metoprolol succinate (TOPROL-XL) 50 MG 24 hr tablet Take 50 mg by mouth daily. Take with or immediately following a meal.      . Multiple Vitamins-Minerals (MULTIVITAMIN PO) Take by mouth daily.      . nitroGLYCERIN (NITROSTAT) 0.4 MG SL tablet Place 0.4 mg under the tongue every 5 (five) minutes as needed for chest pain.      Marland Kitchen omeprazole (PRILOSEC) 40 MG capsule Take 40 mg by mouth as needed.       . rosuvastatin (CRESTOR) 40 MG tablet Take 1 tablet (40 mg total) by mouth daily.  30 tablet  6   No current facility-administered medications for this visit.    Allergies:    Allergies  Allergen Reactions  . Sulfa Antibiotics   . Tetracyclines & Related     Social History:  The patient  reports that he has never smoked. He has never used smokeless tobacco. He reports that he drinks about 1.2 ounces of alcohol per week. He reports that he does not use illicit drugs.   ROS:  Please see the history of present illness.   No CP, SOB, no fevers.   PHYSICAL EXAM: VS:  BP 130/82  Pulse 53  Ht 6\' 1"  (1.854 m)  Wt 208 lb (94.348 kg)  BMI 27.45 kg/m2 Well nourished, well developed, in no acute distress HEENT: normal Neck: no JVD Cardiac:  normal S1, S2; mild brady RR; no murmur Lungs:  clear to auscultation bilaterally, no wheezing, rhonchi or rales Abd: soft, nontender, no hepatomegaly Ext: no edema Skin: warm and dry Neuro: no focal abnormalities noted  EKG:  07/15/13-sinus bradycardia 53, left axis deviation.   ASSESSMENT AND PLAN:  1. Coronary artery disease/angina-currently well controlled. Previous symptoms were relieved with Prevacid. He thinks that they were esophageal. Continue with current plan. Prior bypass. Cardiac maintenance/rehabilitation. Enjoys playing golf. 2. GERD-omeprazole, no current issues 3. Hyperlipidemia-LDL 74, excellent, Crestor 40. No changes made in medications. 4. Hypertension-blood pressure previously low at 98 systolic. At that time  we decreased his lisinopril to 5 mg from 10. He seems to overall be doing much better. Occasional orthostatic like symptoms. Continue to monitor.  We will continue with metoprolol for antianginal support. 5. Impaired glucose tolerance-hemoglobin A1C was 6.0. Dr. Orland Penman had suggested the possibility of taking medication for this, likely metformin. He asked my opinion on this and this is not a bad idea. Overall risk of the medication is very low. Continue with diet, exercise. Ultimately, he does state that he is on several medications and if he does not absolutely need to take metformin he does not want to. I understand. 6. I will see him back in 1 year.  Signed, Candee Furbish, MD [facility]  07/15/2013 10:41 AM

## 2013-07-15 NOTE — Patient Instructions (Signed)
The current medical regimen is effective;  continue present plan and medications.  Follow up in 1 year with Dr Skains.  You will receive a letter in the mail 2 months before you are due.  Please call us when you receive this letter to schedule your follow up appointment.  

## 2013-07-17 ENCOUNTER — Encounter (HOSPITAL_COMMUNITY): Payer: Self-pay

## 2013-07-21 ENCOUNTER — Encounter (HOSPITAL_COMMUNITY): Payer: Self-pay

## 2013-07-22 ENCOUNTER — Encounter (HOSPITAL_COMMUNITY): Payer: Self-pay

## 2013-07-24 ENCOUNTER — Encounter (HOSPITAL_COMMUNITY): Payer: Self-pay

## 2013-07-28 ENCOUNTER — Encounter (HOSPITAL_COMMUNITY): Payer: Self-pay

## 2013-07-29 ENCOUNTER — Encounter (HOSPITAL_COMMUNITY): Payer: Self-pay

## 2013-07-31 ENCOUNTER — Encounter (HOSPITAL_COMMUNITY): Payer: Self-pay

## 2013-08-04 ENCOUNTER — Other Ambulatory Visit: Payer: Self-pay | Admitting: *Deleted

## 2013-08-04 ENCOUNTER — Encounter (HOSPITAL_COMMUNITY): Payer: Self-pay

## 2013-08-04 MED ORDER — LISINOPRIL 5 MG PO TABS: 5.0000 mg | ORAL_TABLET | Freq: Every day | ORAL | Status: DC

## 2013-08-05 ENCOUNTER — Encounter (HOSPITAL_COMMUNITY)
Admission: RE | Admit: 2013-08-05 | Discharge: 2013-08-05 | Disposition: A | Discharge status: Home / Self Care | Payer: Self-pay | Source: Ambulatory Visit | Attending: Cardiology | Admitting: Cardiology

## 2013-08-06 ENCOUNTER — Other Ambulatory Visit: Payer: Self-pay

## 2013-08-06 MED ORDER — EZETIMIBE 10 MG PO TABS: 10.0000 mg | ORAL_TABLET | Freq: Every day | ORAL | Status: DC

## 2013-08-07 ENCOUNTER — Encounter (HOSPITAL_COMMUNITY): Payer: Self-pay

## 2013-08-11 ENCOUNTER — Encounter (HOSPITAL_COMMUNITY)
Admission: RE | Admit: 2013-08-11 | Discharge: 2013-08-11 | Disposition: A | Discharge status: Home / Self Care | Payer: Self-pay | Source: Ambulatory Visit | Attending: Cardiology | Admitting: Cardiology

## 2013-08-11 DIAGNOSIS — E785 Hyperlipidemia, unspecified: Secondary | ICD-10-CM | POA: Insufficient documentation

## 2013-08-11 DIAGNOSIS — I2510 Atherosclerotic heart disease of native coronary artery without angina pectoris: Secondary | ICD-10-CM | POA: Insufficient documentation

## 2013-08-11 DIAGNOSIS — Z5189 Encounter for other specified aftercare: Secondary | ICD-10-CM | POA: Insufficient documentation

## 2013-08-12 ENCOUNTER — Encounter (HOSPITAL_COMMUNITY): Payer: Self-pay

## 2013-08-14 ENCOUNTER — Encounter (HOSPITAL_COMMUNITY): Payer: Self-pay

## 2013-08-18 ENCOUNTER — Encounter (HOSPITAL_COMMUNITY): Payer: Self-pay

## 2013-08-19 ENCOUNTER — Encounter (HOSPITAL_COMMUNITY)
Admission: RE | Admit: 2013-08-19 | Discharge: 2013-08-19 | Disposition: A | Discharge status: Home / Self Care | Payer: Self-pay | Source: Ambulatory Visit | Attending: Cardiology | Admitting: Cardiology

## 2013-08-21 ENCOUNTER — Encounter (HOSPITAL_COMMUNITY): Payer: Self-pay

## 2013-08-25 ENCOUNTER — Encounter (HOSPITAL_COMMUNITY)
Admission: RE | Admit: 2013-08-25 | Discharge: 2013-08-25 | Disposition: A | Discharge status: Home / Self Care | Payer: Self-pay | Source: Ambulatory Visit | Attending: Cardiology | Admitting: Cardiology

## 2013-08-26 ENCOUNTER — Encounter (HOSPITAL_COMMUNITY): Payer: Self-pay

## 2013-08-28 ENCOUNTER — Encounter (HOSPITAL_COMMUNITY): Payer: Self-pay

## 2013-09-01 ENCOUNTER — Encounter (HOSPITAL_COMMUNITY): Payer: Self-pay

## 2013-09-02 ENCOUNTER — Encounter (HOSPITAL_COMMUNITY): Payer: Self-pay

## 2013-09-04 ENCOUNTER — Encounter (HOSPITAL_COMMUNITY): Payer: Self-pay

## 2013-09-08 ENCOUNTER — Encounter (HOSPITAL_COMMUNITY): Payer: Self-pay

## 2013-09-09 ENCOUNTER — Encounter (HOSPITAL_COMMUNITY)
Admission: RE | Admit: 2013-09-09 | Discharge: 2013-09-09 | Disposition: A | Discharge status: Home / Self Care | Payer: Self-pay | Source: Ambulatory Visit | Attending: Cardiology | Admitting: Cardiology

## 2013-09-09 DIAGNOSIS — E785 Hyperlipidemia, unspecified: Secondary | ICD-10-CM | POA: Insufficient documentation

## 2013-09-09 DIAGNOSIS — Z5189 Encounter for other specified aftercare: Secondary | ICD-10-CM | POA: Insufficient documentation

## 2013-09-09 DIAGNOSIS — I2510 Atherosclerotic heart disease of native coronary artery without angina pectoris: Secondary | ICD-10-CM | POA: Insufficient documentation

## 2013-09-10 ENCOUNTER — Telehealth: Payer: Self-pay

## 2013-09-10 NOTE — Telephone Encounter (Signed)
Request fromEMST, sent to [place] on 09/11/2013 .

## 2013-09-11 ENCOUNTER — Encounter (HOSPITAL_COMMUNITY): Payer: Self-pay

## 2013-09-16 ENCOUNTER — Encounter (HOSPITAL_COMMUNITY): Payer: Self-pay

## 2013-09-18 ENCOUNTER — Encounter (HOSPITAL_COMMUNITY): Payer: Self-pay

## 2013-09-22 ENCOUNTER — Encounter (HOSPITAL_COMMUNITY): Payer: Self-pay

## 2013-09-23 ENCOUNTER — Encounter (HOSPITAL_COMMUNITY): Payer: Self-pay

## 2013-09-25 ENCOUNTER — Encounter (HOSPITAL_COMMUNITY): Payer: Self-pay

## 2013-09-27 ENCOUNTER — Other Ambulatory Visit: Payer: Self-pay | Admitting: Cardiology

## 2013-09-29 ENCOUNTER — Encounter (HOSPITAL_COMMUNITY): Payer: Self-pay

## 2013-09-30 ENCOUNTER — Encounter (HOSPITAL_COMMUNITY): Payer: Self-pay

## 2013-10-02 ENCOUNTER — Encounter (HOSPITAL_COMMUNITY): Payer: Self-pay

## 2013-10-06 ENCOUNTER — Encounter (HOSPITAL_COMMUNITY): Payer: Self-pay

## 2013-10-07 ENCOUNTER — Encounter (HOSPITAL_COMMUNITY): Payer: Self-pay

## 2013-10-09 ENCOUNTER — Encounter (HOSPITAL_COMMUNITY): Payer: 59

## 2013-10-09 DIAGNOSIS — I10 Essential (primary) hypertension: Secondary | ICD-10-CM | POA: Insufficient documentation

## 2013-10-09 DIAGNOSIS — Z7982 Long term (current) use of aspirin: Secondary | ICD-10-CM | POA: Insufficient documentation

## 2013-10-09 DIAGNOSIS — E785 Hyperlipidemia, unspecified: Secondary | ICD-10-CM | POA: Insufficient documentation

## 2013-10-09 DIAGNOSIS — I25709 Atherosclerosis of coronary artery bypass graft(s), unspecified, with unspecified angina pectoris: Secondary | ICD-10-CM | POA: Insufficient documentation

## 2013-10-09 DIAGNOSIS — Z881 Allergy status to other antibiotic agents status: Secondary | ICD-10-CM | POA: Insufficient documentation

## 2013-10-09 DIAGNOSIS — K219 Gastro-esophageal reflux disease without esophagitis: Secondary | ICD-10-CM | POA: Insufficient documentation

## 2013-10-13 ENCOUNTER — Encounter (HOSPITAL_COMMUNITY)
Admission: RE | Admit: 2013-10-13 | Discharge: 2013-10-13 | Disposition: A | Discharge status: Home / Self Care | Payer: Self-pay | Source: Ambulatory Visit | Attending: Cardiology | Admitting: Cardiology

## 2013-10-14 ENCOUNTER — Encounter (HOSPITAL_COMMUNITY)
Admission: RE | Admit: 2013-10-14 | Discharge: 2013-10-14 | Disposition: A | Discharge status: Home / Self Care | Payer: Self-pay | Source: Ambulatory Visit | Attending: Cardiology | Admitting: Cardiology

## 2013-10-14 DIAGNOSIS — C61 Malignant neoplasm of prostate: Secondary | ICD-10-CM | POA: Insufficient documentation

## 2013-10-14 DIAGNOSIS — N529 Male erectile dysfunction, unspecified: Secondary | ICD-10-CM | POA: Insufficient documentation

## 2013-10-16 ENCOUNTER — Encounter (HOSPITAL_COMMUNITY): Payer: Self-pay

## 2013-10-20 ENCOUNTER — Encounter (HOSPITAL_COMMUNITY)
Admission: RE | Admit: 2013-10-20 | Discharge: 2013-10-20 | Disposition: A | Discharge status: Home / Self Care | Payer: Self-pay | Source: Ambulatory Visit | Attending: Cardiology | Admitting: Cardiology

## 2013-10-21 ENCOUNTER — Encounter (HOSPITAL_COMMUNITY): Payer: Self-pay

## 2013-10-22 ENCOUNTER — Telehealth: Payer: Self-pay | Admitting: Cardiology

## 2013-10-22 ENCOUNTER — Encounter: Payer: Self-pay | Admitting: Cardiology

## 2013-10-22 ENCOUNTER — Ambulatory Visit (INDEPENDENT_AMBULATORY_CARE_PROVIDER_SITE_OTHER): Payer: 59 | Admitting: Cardiology

## 2013-10-22 VITALS — BP 132/76 | HR 57 | Ht 73.0 in | Wt 204.0 lb

## 2013-10-22 DIAGNOSIS — R002 Palpitations: Secondary | ICD-10-CM

## 2013-10-22 DIAGNOSIS — I25118 Atherosclerotic heart disease of native coronary artery with other forms of angina pectoris: Secondary | ICD-10-CM

## 2013-10-22 DIAGNOSIS — K219 Gastro-esophageal reflux disease without esophagitis: Secondary | ICD-10-CM

## 2013-10-22 DIAGNOSIS — E785 Hyperlipidemia, unspecified: Secondary | ICD-10-CM

## 2013-10-22 DIAGNOSIS — I10 Essential (primary) hypertension: Secondary | ICD-10-CM

## 2013-10-22 NOTE — Telephone Encounter (Signed)
New problem   Pt is having upper neck tightness and need to speak to nurse. Please call pt.

## 2013-10-22 NOTE — Progress Notes (Signed)
[place]. [address] [place], [place]  [address] Phone: [phone] Fax:  [phone]  Date:  10/22/2013   ID:  Collin Morse, DOB 15-Dec-1950, MRN [id number]  PCP:  Gavin Pound, MD   History of Present Illness: Collin Morse is a 63 y.o. male with prior two-vessel bypass, LIMA to LAD, SVG to circumflex 01/2009, hypertension, hyperlipidemia here for symptoms of throat tightness, palpitations . Previously diagnosed with esophageal spasms. His symptoms previously were contained after proton pump inhibitor. He was also worried about palpitation that woke him up from sleep. Please see below for details. Had a golf outing and did well after the first day. Nuclear stress test October of 2013 following bypass was low risk.  He takes Plavix when he needs to stop his aspirin for allergy shots on occasion. He does not take Plavix on a daily basis.  Wt Readings from Last 3 Encounters:  10/22/13 204 lb (92.534 kg)  07/15/13 208 lb (94.348 kg)  12/18/12 210 lb (95.255 kg)     Past Medical History  Diagnosis Date  . HTN (hypertension) since 2008  . Hyperlipemia   . Hypothyroidism   . Hernia   . Dizziness and giddiness   . Coronary artery disease 2011  . Prostate cancer   . Coronary atherosclerosis of native coronary artery 12/18/2012    CABG LIMA to LAD, SVG to circumflex 01/2009    Past Surgical History  Procedure Laterality Date  . Prostate surgery  2006  . Coronary artery bypass graft  2011    Current Outpatient Prescriptions  Medication Sig Dispense Refill  . aspirin 81 MG tablet Take 162 mg by mouth daily.       . clopidogrel (PLAVIX) 75 MG tablet Take 1 tablet (75 mg total) by mouth once.  90 tablet  3  . ezetimibe (ZETIA) 10 MG tablet Take 1 tablet (10 mg total) by mouth daily.  90 tablet  3  . lisinopril (PRINIVIL,ZESTRIL) 5 MG tablet Take 1 tablet (5 mg total) by mouth daily.  30 tablet  5  . metoprolol succinate (TOPROL-XL) 50 MG 24 hr tablet TAKE 1 TABLET  EVERY DAY  30 tablet  7  . Multiple Vitamins-Minerals (MULTIVITAMIN PO) Take by mouth daily.      . nitroGLYCERIN (NITROSTAT) 0.4 MG SL tablet Place 0.4 mg under the tongue every 5 (five) minutes as needed for chest pain.      Marland Kitchen omeprazole (PRILOSEC) 40 MG capsule Take 40 mg by mouth as needed.       . rosuvastatin (CRESTOR) 40 MG tablet Take 1 tablet (40 mg total) by mouth daily.  30 tablet  6   No current facility-administered medications for this visit.    Allergies:    Allergies  Allergen Reactions  . Sulfa Antibiotics     Makes sick   . Tetracyclines & Related     Makes sick    Social History:  The patient  reports that he has never smoked. He has never used smokeless tobacco. He reports that he drinks about 1.2 ounces of alcohol per week. He reports that he does not use illicit drugs.   ROS:  Please see the history of present illness.   No CP, SOB, no fevers.   PHYSICAL EXAM: VS:  BP 132/76  Pulse 57  Ht 6\' 1"  (1.854 m)  Wt 204 lb (92.534 kg)  BMI 26.92 kg/m2 Well nourished, well developed, in no acute distress HEENT:  normal Neck: no JVD Cardiac:  normal S1, S2; mild brady RR; no murmur Lungs:  clear to auscultation bilaterally, no wheezing, rhonchi or rales Abd: soft, nontender, no hepatomegaly Ext: no edema Skin: warm and dry Neuro: no focal abnormalities noted  EKG:  07/15/13-sinus bradycardia 53, left axis deviation.  10/22/13-sinus bradycardia rate 57, left axis deviation, nonspecific ST-T wave changes  ASSESSMENT AND PLAN:  1. Coronary artery disease/angina/palpitation- had an episode of burning in his chest, throat that was associated with tail gating, spicy food. Last night he had another episode of some burning when laying down, he thinks it may be esophageal. What really scared him was an isolated palpitation or 2, one of which woke him up from sleep. He had an isolated thump. He has had these in the past. He had a similar experience last year while playing  golf. Stress test approximately one year ago was low risk. Previous symptoms were relieved with Prevacid.  Continue with current plan. Prior bypass. Cardiac maintenance/rehabilitation. Enjoys playing golf. If symptoms worsen or become more worrisome, he knows to contact me and we will pursue further testing, either repeat stress test or cardiac catheterization. 2. GERD-omeprazole, no current issues 3. Hyperlipidemia-LDL 74, excellent, Crestor 40. No changes made in medications. 4. Hypertension-blood pressure previously low at 98 systolic. At that time we decreased his lisinopril to 5 mg from 10. He seems to overall be doing much better. Occasional orthostatic like symptoms. Continue to monitor.  We will continue with metoprolol for antianginal support. 5. Impaired glucose tolerance-hemoglobin A1C was 6.0.  6. I will see him back as previously scheduled.  Signed, Candee Furbish, MD [facility]  10/22/2013 3:27 PM

## 2013-10-22 NOTE — Telephone Encounter (Signed)
Per pt call - states he has been having throat tightness like before which was Dx as esophageal spasms but he is also having very pronounced palpitations.  This throat tightness extends into his shoulders.  No specific chest pain.  These palpitations woke him from his sleep last night and he is very concerned.  Added onto schedule today to see Dr Marlou Porch at 3:15 pm as a work in.  He is aware of the location.

## 2013-10-22 NOTE — Patient Instructions (Signed)
Your physician recommends that you continue on your current medications as directed. Please refer to the Current Medication list given to you today.  Your physician wants you to follow-up in: July of 2016. You will receive a reminder letter in the mail two months in advance. If you don't receive a letter, please call our office to schedule the follow-up appointment.

## 2013-10-23 ENCOUNTER — Encounter (HOSPITAL_COMMUNITY): Payer: Self-pay

## 2013-10-27 ENCOUNTER — Encounter (HOSPITAL_COMMUNITY)
Admission: RE | Admit: 2013-10-27 | Discharge: 2013-10-27 | Disposition: A | Discharge status: Home / Self Care | Payer: Self-pay | Source: Ambulatory Visit | Attending: Cardiology | Admitting: Cardiology

## 2013-10-28 ENCOUNTER — Telehealth: Payer: Self-pay | Admitting: Cardiology

## 2013-10-28 ENCOUNTER — Encounter (HOSPITAL_COMMUNITY)
Admission: RE | Admit: 2013-10-28 | Discharge: 2013-10-28 | Disposition: A | Discharge status: Home / Self Care | Payer: Self-pay | Source: Ambulatory Visit | Attending: Cardiology | Admitting: Cardiology

## 2013-10-28 DIAGNOSIS — R0789 Other chest pain: Secondary | ICD-10-CM

## 2013-10-28 NOTE — Telephone Encounter (Signed)
Follow up ° °Pt returned call  °

## 2013-10-28 NOTE — Telephone Encounter (Signed)
Per pt - sx continue and he would like to go ahead with further testing.  Still having neck and left shoulder pressure.  He seems to notice it more after eating and at rest.  Feels as though he can walk a treadmill for nuc study if that's what is ordered.  Aware I will forward this information to Dr Marlou Porch for orders,  He will be called to schedule the appt.  Of note pt will be out of town the week of 11/2 and would like to have the testing done prior to then if possible.

## 2013-10-28 NOTE — Telephone Encounter (Signed)
New message    Patient calling regarding last office visit - setting up nuclear stress test.

## 2013-10-28 NOTE — Telephone Encounter (Signed)
Left message for pt to call back about further testing.  Dr. Marlou Porch note says either stress test or cath.  Will discuss s/s with pt and f/u with Dr Marlou Porch to determine the type of testing needed.

## 2013-10-29 NOTE — Telephone Encounter (Signed)
OK to order NUC stress. Dx CP.  Candee Furbish, MD

## 2013-10-30 ENCOUNTER — Encounter (HOSPITAL_COMMUNITY): Payer: Self-pay

## 2013-10-30 NOTE — Telephone Encounter (Signed)
Pt aware the nuc study has been ordered and he will be contacted to schedule.  Reviewed instructions over the phone.

## 2013-11-03 ENCOUNTER — Encounter (HOSPITAL_COMMUNITY): Payer: Self-pay

## 2013-11-03 ENCOUNTER — Ambulatory Visit (HOSPITAL_COMMUNITY): Payer: 59 | Attending: Cardiology | Admitting: Radiology

## 2013-11-03 VITALS — BP 150/87 | Ht 73.0 in | Wt 198.0 lb

## 2013-11-03 DIAGNOSIS — R0789 Other chest pain: Secondary | ICD-10-CM

## 2013-11-03 DIAGNOSIS — Z951 Presence of aortocoronary bypass graft: Secondary | ICD-10-CM | POA: Diagnosis not present

## 2013-11-03 DIAGNOSIS — I10 Essential (primary) hypertension: Secondary | ICD-10-CM | POA: Diagnosis not present

## 2013-11-03 DIAGNOSIS — R002 Palpitations: Secondary | ICD-10-CM | POA: Diagnosis not present

## 2013-11-03 DIAGNOSIS — R0609 Other forms of dyspnea: Secondary | ICD-10-CM | POA: Insufficient documentation

## 2013-11-03 DIAGNOSIS — I2510 Atherosclerotic heart disease of native coronary artery without angina pectoris: Secondary | ICD-10-CM | POA: Diagnosis not present

## 2013-11-03 DIAGNOSIS — R079 Chest pain, unspecified: Secondary | ICD-10-CM | POA: Insufficient documentation

## 2013-11-03 MED ORDER — TECHNETIUM TC 99M SESTAMIBI GENERIC - CARDIOLITE
10.0000 | Freq: Once | INTRAVENOUS | Status: AC | PRN
  Administered 2013-11-03: 10 via INTRAVENOUS

## 2013-11-03 MED ORDER — TECHNETIUM TC 99M SESTAMIBI GENERIC - CARDIOLITE
30.0000 | Freq: Once | INTRAVENOUS | Status: AC | PRN
  Administered 2013-11-03: 30 via INTRAVENOUS

## 2013-11-03 NOTE — Progress Notes (Signed)
[facility] [address] [place], [place] [address] [phone]    Cardiology Nuclear Med Study  Collin Morse is a 63 y.o. male     MRN : [id number]     DOB: 07/17/1950  Procedure Date: 11/03/2013  Nuclear Med Background Indication for Stress Test:  Evaluation for Ischemia and Graft Patency History:  HX CAD-CABG Cardiac Risk Factors: Carotid Disease and Hypertension  Symptoms:  Chest Pain, DOE and Palpitations   Nuclear Pre-Procedure Caffeine/Decaff Intake:  None> 12hrs NPO After: 7:30pm   Lungs:  clear O2 Sat: 95% on room air. IV 0.9% NS with Angio Cath:  22g  IV Site: R Antecubital x 1, tolerated well IV Started by:  Irven Baltimore, RN  Chest Size (in):  44 Cup Size: n/a  Height: 6\' 1"  (1.854 m)  Weight:  198 lb (89.812 kg)  BMI:  Body mass index is 26.13 kg/(m^2). Tech Comments:  Patient held Toprol x 48 hrs. Patient took Lisinopril on arrival. Irven Baltimore, RN.    Nuclear Med Study 1 or 2 day study: 1 day  Stress Test Type:  Stress  Reading MD: N/A  Order Authorizing Provider:  Candee Furbish, MD  Resting Radionuclide: Technetium 26m Sestamibi  Resting Radionuclide Dose: 11.0 mCi   Stress Radionuclide:  Technetium 75m Sestamibi  Stress Radionuclide Dose: 33.0 mCi           Stress Protocol Rest HR: 68 Stress HR: 153  Rest BP: 150/87 Stress BP: 214/91  Exercise Time (min): 10:02 METS: 11.8   Predicted Max HR: 156 bpm % Max HR: 98.08 bpm Rate Pressure Product: 32742   Dose of Adenosine (mg):  n/a Dose of Lexiscan: n/a mg  Dose of Atropine (mg): n/a Dose of Dobutamine: n/a mcg/kg/min (at max HR)  Stress Test Technologist: Crissie Figures, RN  Nuclear Technologist:  Earl Many, CNMT     Rest Procedure:  Myocardial perfusion imaging was performed at rest 45 minutes following the intravenous administration of Technetium 91m Sestamibi. Rest ECG: NSR, NSST.  Stress Procedure:  The patient exercised on the treadmill utilizing the Bruce  Protocol for 10:02 minutes. The patient stopped due to dyspnea and denied any chest pain.  Technetium 80m Sestamibi was injected at peak exercise and myocardial perfusion imaging was performed after a brief delay. Stress ECG: No significant ST segment change suggestive of ischemia.  QPS Raw Data Images:  Acquisition technically good; normal left ventricular size. Stress Images:  There is decreased uptake in the inferior wall and apex. Rest Images:  There is decreased uptake in the inferior wall and apex. Subtraction (SDS):  No evidence of ischemia. Transient Ischemic Dilatation (Normal <1.22):  1.07 Lung/Heart Ratio (Normal <0.45):  0.33  Quantitative Gated Spect Images QGS EDV:  100 ml QGS ESV:  40 ml  Impression Exercise Capacity:  Good exercise capacity. BP Response:  Hypertensive blood pressure response. Clinical Symptoms:  There is dyspnea. ECG Impression:  No significant ST segment change suggestive of ischemia. Comparison with Prior Nuclear Study: No images to compare  Overall Impression:  Low risk stress nuclear study with small, mild intensity, fixed inferior and apical defects consistent with apical and inferior thinning; no ischemia.  LV Ejection Fraction: 60%.  LV Wall Motion:  NL LV Function; NL Wall Motion   [name]

## 2013-11-04 ENCOUNTER — Encounter (HOSPITAL_COMMUNITY)
Admission: RE | Admit: 2013-11-04 | Discharge: 2013-11-04 | Disposition: A | Discharge status: Home / Self Care | Payer: Self-pay | Source: Ambulatory Visit | Attending: Cardiology | Admitting: Cardiology

## 2013-11-06 ENCOUNTER — Encounter (HOSPITAL_COMMUNITY): Payer: Self-pay

## 2013-11-10 ENCOUNTER — Encounter (HOSPITAL_COMMUNITY): Payer: Self-pay

## 2013-11-10 DIAGNOSIS — Z5189 Encounter for other specified aftercare: Secondary | ICD-10-CM | POA: Insufficient documentation

## 2013-11-10 DIAGNOSIS — I2510 Atherosclerotic heart disease of native coronary artery without angina pectoris: Secondary | ICD-10-CM | POA: Insufficient documentation

## 2013-11-10 DIAGNOSIS — E785 Hyperlipidemia, unspecified: Secondary | ICD-10-CM | POA: Insufficient documentation

## 2013-11-11 ENCOUNTER — Encounter (HOSPITAL_COMMUNITY): Payer: 59

## 2013-11-13 ENCOUNTER — Encounter (HOSPITAL_COMMUNITY): Payer: Self-pay

## 2013-11-17 ENCOUNTER — Encounter (HOSPITAL_COMMUNITY): Payer: Self-pay

## 2013-11-18 ENCOUNTER — Encounter (HOSPITAL_COMMUNITY): Payer: Self-pay

## 2013-11-20 ENCOUNTER — Encounter (HOSPITAL_COMMUNITY): Payer: Self-pay

## 2013-11-20 DIAGNOSIS — C801 Malignant (primary) neoplasm, unspecified: Secondary | ICD-10-CM | POA: Insufficient documentation

## 2013-11-24 ENCOUNTER — Encounter (HOSPITAL_COMMUNITY): Payer: Self-pay

## 2013-11-25 ENCOUNTER — Encounter (HOSPITAL_COMMUNITY): Payer: Self-pay

## 2013-11-27 ENCOUNTER — Encounter (HOSPITAL_COMMUNITY): Payer: Self-pay

## 2013-11-30 ENCOUNTER — Other Ambulatory Visit: Payer: Self-pay

## 2013-11-30 MED ORDER — ROSUVASTATIN CALCIUM 40 MG PO TABS: 40.0000 mg | ORAL_TABLET | Freq: Every day | ORAL | Status: DC

## 2013-12-01 ENCOUNTER — Encounter (HOSPITAL_COMMUNITY): Payer: Self-pay

## 2013-12-02 ENCOUNTER — Encounter (HOSPITAL_COMMUNITY): Payer: Self-pay

## 2013-12-08 ENCOUNTER — Encounter (HOSPITAL_COMMUNITY)
Admission: RE | Admit: 2013-12-08 | Discharge: 2013-12-08 | Disposition: A | Discharge status: Home / Self Care | Payer: Self-pay | Source: Ambulatory Visit | Attending: Cardiology | Admitting: Cardiology

## 2013-12-09 ENCOUNTER — Encounter (HOSPITAL_COMMUNITY)
Admission: RE | Admit: 2013-12-09 | Discharge: 2013-12-09 | Disposition: A | Discharge status: Home / Self Care | Payer: Self-pay | Source: Ambulatory Visit | Attending: Cardiology | Admitting: Cardiology

## 2013-12-09 DIAGNOSIS — I2510 Atherosclerotic heart disease of native coronary artery without angina pectoris: Secondary | ICD-10-CM | POA: Insufficient documentation

## 2013-12-09 DIAGNOSIS — E785 Hyperlipidemia, unspecified: Secondary | ICD-10-CM | POA: Insufficient documentation

## 2013-12-09 DIAGNOSIS — Z5189 Encounter for other specified aftercare: Secondary | ICD-10-CM | POA: Insufficient documentation

## 2013-12-10 ENCOUNTER — Other Ambulatory Visit: Payer: Self-pay

## 2013-12-10 MED ORDER — OMEPRAZOLE 40 MG PO CPDR: 40.0000 mg | DELAYED_RELEASE_CAPSULE | ORAL | Status: DC | PRN

## 2013-12-18 ENCOUNTER — Encounter (HOSPITAL_COMMUNITY): Payer: Self-pay

## 2013-12-22 ENCOUNTER — Encounter (HOSPITAL_COMMUNITY): Payer: Self-pay

## 2013-12-23 ENCOUNTER — Encounter (HOSPITAL_COMMUNITY): Payer: Self-pay

## 2013-12-25 ENCOUNTER — Encounter (HOSPITAL_COMMUNITY): Payer: Self-pay

## 2013-12-29 ENCOUNTER — Encounter (HOSPITAL_COMMUNITY): Payer: Self-pay

## 2013-12-30 ENCOUNTER — Encounter (HOSPITAL_COMMUNITY): Payer: Self-pay

## 2014-01-01 ENCOUNTER — Encounter (HOSPITAL_COMMUNITY): Payer: Self-pay

## 2014-01-05 ENCOUNTER — Encounter (HOSPITAL_COMMUNITY): Payer: Self-pay

## 2014-01-06 ENCOUNTER — Encounter (HOSPITAL_COMMUNITY): Payer: Self-pay

## 2014-01-08 ENCOUNTER — Encounter (HOSPITAL_COMMUNITY): Payer: Self-pay

## 2014-01-12 ENCOUNTER — Encounter (HOSPITAL_COMMUNITY): Payer: Self-pay

## 2014-01-12 DIAGNOSIS — I2510 Atherosclerotic heart disease of native coronary artery without angina pectoris: Secondary | ICD-10-CM | POA: Insufficient documentation

## 2014-01-12 DIAGNOSIS — Z5189 Encounter for other specified aftercare: Secondary | ICD-10-CM | POA: Insufficient documentation

## 2014-01-12 DIAGNOSIS — E785 Hyperlipidemia, unspecified: Secondary | ICD-10-CM | POA: Insufficient documentation

## 2014-01-13 ENCOUNTER — Encounter (HOSPITAL_COMMUNITY)
Admission: RE | Admit: 2014-01-13 | Discharge: 2014-01-13 | Disposition: A | Discharge status: Home / Self Care | Payer: Self-pay | Source: Ambulatory Visit | Attending: Cardiology | Admitting: Cardiology

## 2014-01-15 ENCOUNTER — Encounter (HOSPITAL_COMMUNITY): Payer: Self-pay

## 2014-01-19 ENCOUNTER — Encounter (HOSPITAL_COMMUNITY): Payer: Self-pay

## 2014-01-20 ENCOUNTER — Other Ambulatory Visit: Payer: Self-pay | Admitting: Cardiology

## 2014-01-20 ENCOUNTER — Encounter (HOSPITAL_COMMUNITY): Payer: Self-pay

## 2014-01-22 ENCOUNTER — Encounter (HOSPITAL_COMMUNITY): Payer: Self-pay

## 2014-01-26 ENCOUNTER — Encounter (HOSPITAL_COMMUNITY): Payer: Self-pay

## 2014-01-27 ENCOUNTER — Encounter (HOSPITAL_COMMUNITY): Payer: Self-pay

## 2014-01-29 ENCOUNTER — Encounter (HOSPITAL_COMMUNITY)
Admission: RE | Admit: 2014-01-29 | Discharge: 2014-01-29 | Disposition: A | Discharge status: Home / Self Care | Payer: Self-pay | Source: Ambulatory Visit | Attending: Cardiology | Admitting: Cardiology

## 2014-02-02 ENCOUNTER — Encounter (HOSPITAL_COMMUNITY): Payer: Self-pay

## 2014-02-03 ENCOUNTER — Encounter (HOSPITAL_COMMUNITY)
Admission: RE | Admit: 2014-02-03 | Discharge: 2014-02-03 | Disposition: A | Discharge status: Home / Self Care | Payer: Self-pay | Source: Ambulatory Visit | Attending: Cardiology | Admitting: Cardiology

## 2014-02-05 ENCOUNTER — Encounter (HOSPITAL_COMMUNITY): Payer: Self-pay

## 2014-02-09 ENCOUNTER — Encounter (HOSPITAL_COMMUNITY): Payer: Self-pay

## 2014-02-09 DIAGNOSIS — I2510 Atherosclerotic heart disease of native coronary artery without angina pectoris: Secondary | ICD-10-CM | POA: Insufficient documentation

## 2014-02-09 DIAGNOSIS — Z5189 Encounter for other specified aftercare: Secondary | ICD-10-CM | POA: Insufficient documentation

## 2014-02-09 DIAGNOSIS — E785 Hyperlipidemia, unspecified: Secondary | ICD-10-CM | POA: Insufficient documentation

## 2014-02-10 ENCOUNTER — Encounter (HOSPITAL_COMMUNITY)
Admission: RE | Admit: 2014-02-10 | Discharge: 2014-02-10 | Disposition: A | Discharge status: Home / Self Care | Payer: Self-pay | Source: Ambulatory Visit | Attending: Cardiology | Admitting: Cardiology

## 2014-02-12 ENCOUNTER — Encounter (HOSPITAL_COMMUNITY): Payer: Self-pay

## 2014-02-16 ENCOUNTER — Encounter (HOSPITAL_COMMUNITY): Payer: Self-pay

## 2014-02-17 ENCOUNTER — Encounter (HOSPITAL_COMMUNITY): Payer: Self-pay

## 2014-02-19 ENCOUNTER — Encounter (HOSPITAL_COMMUNITY): Payer: Self-pay

## 2014-02-23 ENCOUNTER — Encounter (HOSPITAL_COMMUNITY): Payer: Self-pay

## 2014-02-24 ENCOUNTER — Encounter (HOSPITAL_COMMUNITY): Payer: Self-pay

## 2014-02-26 ENCOUNTER — Encounter (HOSPITAL_COMMUNITY): Payer: Self-pay

## 2014-03-02 ENCOUNTER — Encounter (HOSPITAL_COMMUNITY): Payer: Self-pay

## 2014-03-03 ENCOUNTER — Encounter (HOSPITAL_COMMUNITY): Payer: Self-pay

## 2014-03-05 ENCOUNTER — Encounter (HOSPITAL_COMMUNITY): Payer: Self-pay

## 2014-03-09 ENCOUNTER — Encounter (HOSPITAL_COMMUNITY): Payer: Self-pay

## 2014-03-10 ENCOUNTER — Encounter (HOSPITAL_COMMUNITY)
Admission: RE | Admit: 2014-03-10 | Discharge: 2014-03-10 | Disposition: A | Discharge status: Home / Self Care | Payer: Self-pay | Source: Ambulatory Visit | Attending: Cardiology | Admitting: Cardiology

## 2014-03-10 DIAGNOSIS — I2510 Atherosclerotic heart disease of native coronary artery without angina pectoris: Secondary | ICD-10-CM | POA: Insufficient documentation

## 2014-03-10 DIAGNOSIS — Z5189 Encounter for other specified aftercare: Secondary | ICD-10-CM | POA: Insufficient documentation

## 2014-03-10 DIAGNOSIS — E785 Hyperlipidemia, unspecified: Secondary | ICD-10-CM | POA: Insufficient documentation

## 2014-03-12 ENCOUNTER — Encounter (HOSPITAL_COMMUNITY): Payer: Self-pay

## 2014-03-16 ENCOUNTER — Encounter (HOSPITAL_COMMUNITY)
Admission: RE | Admit: 2014-03-16 | Discharge: 2014-03-16 | Disposition: A | Discharge status: Home / Self Care | Payer: Self-pay | Source: Ambulatory Visit | Attending: Cardiology | Admitting: Cardiology

## 2014-03-17 ENCOUNTER — Encounter (HOSPITAL_COMMUNITY): Payer: Self-pay

## 2014-03-19 ENCOUNTER — Encounter (HOSPITAL_COMMUNITY): Payer: Self-pay

## 2014-03-23 ENCOUNTER — Encounter (HOSPITAL_COMMUNITY): Payer: Self-pay

## 2014-03-24 ENCOUNTER — Encounter (HOSPITAL_COMMUNITY): Payer: Self-pay

## 2014-03-26 ENCOUNTER — Encounter (HOSPITAL_COMMUNITY): Payer: Self-pay

## 2014-03-30 ENCOUNTER — Encounter (HOSPITAL_COMMUNITY)
Admission: RE | Admit: 2014-03-30 | Discharge: 2014-03-30 | Disposition: A | Discharge status: Home / Self Care | Payer: Self-pay | Source: Ambulatory Visit | Attending: Cardiology | Admitting: Cardiology

## 2014-03-31 ENCOUNTER — Encounter (HOSPITAL_COMMUNITY)
Admission: RE | Admit: 2014-03-31 | Discharge: 2014-03-31 | Disposition: A | Discharge status: Home / Self Care | Payer: Self-pay | Source: Ambulatory Visit | Attending: Cardiology | Admitting: Cardiology

## 2014-04-02 ENCOUNTER — Encounter (HOSPITAL_COMMUNITY): Payer: Self-pay

## 2014-04-06 ENCOUNTER — Encounter (HOSPITAL_COMMUNITY): Payer: Self-pay

## 2014-04-07 ENCOUNTER — Encounter (HOSPITAL_COMMUNITY): Payer: Self-pay

## 2014-04-09 ENCOUNTER — Encounter (HOSPITAL_COMMUNITY): Payer: Self-pay

## 2014-04-13 ENCOUNTER — Encounter (HOSPITAL_COMMUNITY)
Admission: RE | Admit: 2014-04-13 | Discharge: 2014-04-13 | Disposition: A | Discharge status: Home / Self Care | Payer: Self-pay | Source: Ambulatory Visit | Attending: Cardiology | Admitting: Cardiology

## 2014-04-13 DIAGNOSIS — I2510 Atherosclerotic heart disease of native coronary artery without angina pectoris: Secondary | ICD-10-CM | POA: Insufficient documentation

## 2014-04-13 DIAGNOSIS — Z5189 Encounter for other specified aftercare: Secondary | ICD-10-CM | POA: Insufficient documentation

## 2014-04-13 DIAGNOSIS — E785 Hyperlipidemia, unspecified: Secondary | ICD-10-CM | POA: Insufficient documentation

## 2014-04-14 ENCOUNTER — Encounter (HOSPITAL_COMMUNITY): Payer: Self-pay

## 2014-04-16 ENCOUNTER — Encounter (HOSPITAL_COMMUNITY)
Admission: RE | Admit: 2014-04-16 | Discharge: 2014-04-16 | Disposition: A | Discharge status: Home / Self Care | Payer: Self-pay | Source: Ambulatory Visit | Attending: Cardiology | Admitting: Cardiology

## 2014-04-20 ENCOUNTER — Encounter (HOSPITAL_COMMUNITY): Payer: Self-pay

## 2014-04-21 ENCOUNTER — Encounter (HOSPITAL_COMMUNITY): Payer: Self-pay

## 2014-04-23 ENCOUNTER — Encounter (HOSPITAL_COMMUNITY): Payer: Self-pay

## 2014-04-27 ENCOUNTER — Encounter (HOSPITAL_COMMUNITY)
Admission: RE | Admit: 2014-04-27 | Discharge: 2014-04-27 | Disposition: A | Discharge status: Home / Self Care | Payer: Self-pay | Source: Ambulatory Visit | Attending: Cardiology | Admitting: Cardiology

## 2014-04-28 ENCOUNTER — Encounter (HOSPITAL_COMMUNITY): Payer: Self-pay

## 2014-04-30 ENCOUNTER — Encounter (HOSPITAL_COMMUNITY): Payer: Self-pay

## 2014-05-04 ENCOUNTER — Encounter (HOSPITAL_COMMUNITY): Payer: Self-pay

## 2014-05-05 ENCOUNTER — Encounter (HOSPITAL_COMMUNITY): Payer: Self-pay

## 2014-05-07 ENCOUNTER — Encounter (HOSPITAL_COMMUNITY): Payer: Self-pay

## 2014-05-09 ENCOUNTER — Other Ambulatory Visit: Payer: Self-pay | Admitting: Cardiology

## 2014-05-11 ENCOUNTER — Other Ambulatory Visit: Payer: Self-pay | Admitting: *Deleted

## 2014-05-11 ENCOUNTER — Other Ambulatory Visit: Payer: Self-pay

## 2014-05-11 ENCOUNTER — Encounter (HOSPITAL_COMMUNITY): Payer: Self-pay | Attending: Cardiology

## 2014-05-11 DIAGNOSIS — Z5189 Encounter for other specified aftercare: Secondary | ICD-10-CM | POA: Insufficient documentation

## 2014-05-11 DIAGNOSIS — I2510 Atherosclerotic heart disease of native coronary artery without angina pectoris: Secondary | ICD-10-CM | POA: Insufficient documentation

## 2014-05-11 DIAGNOSIS — E785 Hyperlipidemia, unspecified: Secondary | ICD-10-CM | POA: Insufficient documentation

## 2014-05-11 MED ORDER — METOPROLOL SUCCINATE ER 50 MG PO TB24
50.0000 mg | ORAL_TABLET | Freq: Every day | ORAL | Status: DC
Start: 2014-05-11 — End: 2014-05-11

## 2014-05-11 MED ORDER — METOPROLOL SUCCINATE ER 50 MG PO TB24: 50.0000 mg | ORAL_TABLET | Freq: Every day | ORAL | Status: DC

## 2014-05-12 ENCOUNTER — Encounter (HOSPITAL_COMMUNITY): Payer: Self-pay

## 2014-05-14 ENCOUNTER — Encounter (HOSPITAL_COMMUNITY): Payer: Self-pay

## 2014-05-18 ENCOUNTER — Encounter (HOSPITAL_COMMUNITY): Payer: Self-pay

## 2014-05-19 ENCOUNTER — Encounter (HOSPITAL_COMMUNITY): Payer: Self-pay

## 2014-05-21 ENCOUNTER — Encounter (HOSPITAL_COMMUNITY): Payer: Self-pay

## 2014-05-25 ENCOUNTER — Encounter (HOSPITAL_COMMUNITY): Payer: Self-pay

## 2014-05-26 ENCOUNTER — Encounter (HOSPITAL_COMMUNITY): Payer: Self-pay

## 2014-05-28 ENCOUNTER — Encounter (HOSPITAL_COMMUNITY): Payer: Self-pay

## 2014-06-01 ENCOUNTER — Encounter (HOSPITAL_COMMUNITY): Payer: Self-pay

## 2014-06-02 ENCOUNTER — Encounter (HOSPITAL_COMMUNITY): Admission: RE | Admit: 2014-06-02 | Payer: Self-pay | Source: Ambulatory Visit

## 2014-06-03 ENCOUNTER — Telehealth: Payer: Self-pay | Admitting: Cardiology

## 2014-06-03 DIAGNOSIS — I10 Essential (primary) hypertension: Secondary | ICD-10-CM

## 2014-06-03 DIAGNOSIS — Z79899 Other long term (current) drug therapy: Secondary | ICD-10-CM

## 2014-06-03 DIAGNOSIS — E785 Hyperlipidemia, unspecified: Secondary | ICD-10-CM

## 2014-06-03 NOTE — Telephone Encounter (Signed)
New Message       Pt called to schedule f/u appt w/ Dr. Marlou Porch per recall and wants to know if he needs to have fasting labs done for this appt. No labs ordered in Epic, please call pt back and advise.

## 2014-06-03 NOTE — Telephone Encounter (Signed)
Check lipid profile, ALT, basic metabolic profile if not already done by Dr. Ailene Ards, Elta Guadeloupe, MD

## 2014-06-03 NOTE — Telephone Encounter (Signed)
Will ask Dr Marlou Porch to review and call pt with any new orders

## 2014-06-04 ENCOUNTER — Encounter (HOSPITAL_COMMUNITY): Payer: Self-pay

## 2014-06-04 NOTE — Telephone Encounter (Signed)
Pt aware and order placed and linked.  appt scheduled for 7/7.

## 2014-06-09 ENCOUNTER — Encounter (HOSPITAL_COMMUNITY): Payer: Self-pay

## 2014-06-11 ENCOUNTER — Encounter (HOSPITAL_COMMUNITY): Payer: Self-pay | Attending: Cardiology

## 2014-06-11 DIAGNOSIS — Z5189 Encounter for other specified aftercare: Secondary | ICD-10-CM | POA: Insufficient documentation

## 2014-06-11 DIAGNOSIS — I2510 Atherosclerotic heart disease of native coronary artery without angina pectoris: Secondary | ICD-10-CM | POA: Insufficient documentation

## 2014-06-11 DIAGNOSIS — E785 Hyperlipidemia, unspecified: Secondary | ICD-10-CM | POA: Insufficient documentation

## 2014-06-15 ENCOUNTER — Encounter (HOSPITAL_COMMUNITY): Payer: Self-pay

## 2014-06-16 ENCOUNTER — Other Ambulatory Visit: Payer: Self-pay | Admitting: Cardiology

## 2014-06-16 ENCOUNTER — Encounter (HOSPITAL_COMMUNITY): Payer: Self-pay

## 2014-06-18 ENCOUNTER — Encounter (HOSPITAL_COMMUNITY): Payer: Self-pay

## 2014-06-22 ENCOUNTER — Encounter (HOSPITAL_COMMUNITY): Payer: Self-pay

## 2014-06-23 ENCOUNTER — Encounter (HOSPITAL_COMMUNITY): Payer: Self-pay

## 2014-06-25 ENCOUNTER — Encounter (HOSPITAL_COMMUNITY): Payer: Self-pay

## 2014-06-29 ENCOUNTER — Encounter (HOSPITAL_COMMUNITY): Payer: Self-pay

## 2014-06-30 ENCOUNTER — Encounter (HOSPITAL_COMMUNITY): Payer: Self-pay

## 2014-07-02 ENCOUNTER — Telehealth: Payer: Self-pay | Admitting: Cardiology

## 2014-07-02 ENCOUNTER — Encounter (HOSPITAL_COMMUNITY): Payer: Self-pay

## 2014-07-02 DIAGNOSIS — R0789 Other chest pain: Secondary | ICD-10-CM

## 2014-07-02 NOTE — Telephone Encounter (Signed)
New message      Pt had Heart surgery 2011, having tightness in neck and not feeling well, Pt didn't state chest pain related.  Would like to see doctor earlier than July appt because of issue

## 2014-07-02 NOTE — Telephone Encounter (Signed)
Patient has these symptoms every year for a couple of weeks of tightness in neck and not feeling well. Only thing different this year is patient experienced nausea after walking last night. Patient has had some esophageal problems and is not sure if this is related. Patient felt better after eating. Patient currently just doesn't feel well and no tightness in neck right now. Informed patient that Dr. Marlou Porch does not have any other openings before his appointment in two weeks. Informed patient that he could possibly see an NP or PA. Patient stated he will be calling his PCP also. Informed patient if he feels worse and has the tightness in neck again to seek immediate health care, since this could be heart related, not his esophagus. Patient stated he was going to have something to eat, that this helps. Will forward to Dr. Marlou Porch for further instructions.

## 2014-07-06 ENCOUNTER — Other Ambulatory Visit: Payer: Self-pay | Admitting: Cardiology

## 2014-07-06 ENCOUNTER — Encounter (HOSPITAL_COMMUNITY): Payer: Self-pay

## 2014-07-06 NOTE — Telephone Encounter (Signed)
Set up for NUC stress - chest pain dx. Candee Furbish, MD

## 2014-07-06 NOTE — Telephone Encounter (Signed)
Called patient about setting up nuc stress. Patient stated he was feeling better, but agreed to stress test. Stress test ordered and will send [facility] note to schedule. Went over instructions for lexiscan stress test. Patient verbalized understanding.

## 2014-07-07 ENCOUNTER — Encounter (HOSPITAL_COMMUNITY): Payer: Self-pay

## 2014-07-09 ENCOUNTER — Emergency Department (HOSPITAL_COMMUNITY): Payer: Commercial Managed Care - HMO

## 2014-07-09 ENCOUNTER — Encounter (HOSPITAL_COMMUNITY): Payer: Self-pay | Admitting: *Deleted

## 2014-07-09 ENCOUNTER — Emergency Department (HOSPITAL_COMMUNITY)
Admission: EM | Admit: 2014-07-09 | Discharge: 2014-07-09 | Disposition: A | Discharge status: Home / Self Care | Payer: Commercial Managed Care - HMO | Attending: Emergency Medicine | Admitting: Emergency Medicine

## 2014-07-09 ENCOUNTER — Encounter (HOSPITAL_COMMUNITY): Payer: Self-pay

## 2014-07-09 ENCOUNTER — Other Ambulatory Visit: Payer: Self-pay

## 2014-07-09 DIAGNOSIS — I10 Essential (primary) hypertension: Secondary | ICD-10-CM | POA: Insufficient documentation

## 2014-07-09 DIAGNOSIS — E785 Hyperlipidemia, unspecified: Secondary | ICD-10-CM | POA: Insufficient documentation

## 2014-07-09 DIAGNOSIS — Z79899 Other long term (current) drug therapy: Secondary | ICD-10-CM | POA: Diagnosis not present

## 2014-07-09 DIAGNOSIS — Z7982 Long term (current) use of aspirin: Secondary | ICD-10-CM | POA: Diagnosis not present

## 2014-07-09 DIAGNOSIS — Z8719 Personal history of other diseases of the digestive system: Secondary | ICD-10-CM | POA: Insufficient documentation

## 2014-07-09 DIAGNOSIS — R0789 Other chest pain: Secondary | ICD-10-CM | POA: Diagnosis not present

## 2014-07-09 DIAGNOSIS — Z8546 Personal history of malignant neoplasm of prostate: Secondary | ICD-10-CM | POA: Diagnosis not present

## 2014-07-09 DIAGNOSIS — I2510 Atherosclerotic heart disease of native coronary artery without angina pectoris: Secondary | ICD-10-CM | POA: Diagnosis not present

## 2014-07-09 DIAGNOSIS — R079 Chest pain, unspecified: Secondary | ICD-10-CM | POA: Diagnosis present

## 2014-07-09 LAB — COMPREHENSIVE METABOLIC PANEL
ALK PHOS: 33 U/L — AB (ref 38–126)
ALT: 43 U/L (ref 17–63)
AST: 28 U/L (ref 15–41)
Albumin: 4.1 g/dL (ref 3.5–5.0)
Anion gap: 10 (ref 5–15)
BUN: 18 mg/dL (ref 6–20)
CO2: 23 mmol/L (ref 22–32)
CREATININE: 1.25 mg/dL — AB (ref 0.61–1.24)
Calcium: 8.7 mg/dL — ABNORMAL LOW (ref 8.9–10.3)
Chloride: 106 mmol/L (ref 101–111)
GFR calc Af Amer: >60 mL/min (ref >60)
GFR, EST NON AFRICAN AMERICAN: 59 mL/min — AB (ref >60)
Glucose, Bld: 112 mg/dL — ABNORMAL HIGH (ref 65–99)
Potassium: 3.8 mmol/L (ref 3.5–5.1)
SODIUM: 139 mmol/L (ref 135–145)
Total Bilirubin: 0.6 mg/dL (ref 0.3–1.2)
Total Protein: 7 g/dL (ref 6.5–8.1)

## 2014-07-09 LAB — CBC
HCT: 41.5 % (ref 39.0–52.0)
Hemoglobin: 14.2 g/dL (ref 13.0–17.0)
MCH: 31.3 pg (ref 26.0–34.0)
MCHC: 34.2 g/dL (ref 30.0–36.0)
MCV: 91.6 fL (ref 78.0–100.0)
Platelets: 192 10*3/uL (ref 150–400)
RBC: 4.53 MIL/uL (ref 4.22–5.81)
RDW: 13.2 % (ref 11.5–15.5)
WBC: 7.9 10*3/uL (ref 4.0–10.5)

## 2014-07-09 LAB — CK: CK TOTAL: 119 U/L (ref 49–397)

## 2014-07-09 LAB — TSH: TSH: 3.931 u[IU]/mL (ref 0.350–4.500)

## 2014-07-09 LAB — TROPONIN I: Troponin I: <0.03 ng/mL (ref <0.031)

## 2014-07-09 NOTE — ED Notes (Signed)
Pt transported to xray 

## 2014-07-09 NOTE — ED Notes (Signed)
Pt. Left with all belongings and refused wheelchair 

## 2014-07-09 NOTE — Discharge Instructions (Signed)
As discussed, your evaluation today has been largely reassuring.  But, it is important that you monitor your condition carefully, and do not hesitate to return to the ED if you develop new, or concerning changes in your condition. ? ?Otherwise, please follow-up with your physician for appropriate ongoing care. ? ?

## 2014-07-09 NOTE — ED Notes (Signed)
Dr. Lockwood at the bedside. 

## 2014-07-09 NOTE — ED Notes (Signed)
PT c/o R chest pressure that radiates to L shoulder x 1 week.  Extensive cardiac hx, hiatal hernia and GERD.  Pt came to ED today b/c pressure is increasing and he felt nauseated.

## 2014-07-10 ENCOUNTER — Other Ambulatory Visit: Payer: Self-pay

## 2014-07-10 ENCOUNTER — Other Ambulatory Visit: Payer: Self-pay | Admitting: *Deleted

## 2014-07-10 ENCOUNTER — Telehealth (HOSPITAL_COMMUNITY): Payer: Self-pay

## 2014-07-10 MED ORDER — METOPROLOL SUCCINATE ER 50 MG PO TB24: 50.0000 mg | ORAL_TABLET | Freq: Every day | ORAL | Status: DC

## 2014-07-10 NOTE — ED Provider Notes (Signed)
CSN: [id number]     Arrival date & time 07/09/14  1811 History   First MD Initiated Contact with Patient 07/09/14 1839     Chief Complaint  Patient presents with  . Chest Pain    HPI  Patient presents with ongoing left-sided chest pressure radiating to the left shoulder. This episode has been present for about 1 week, though he has had episodes going back years. Patient's description of symptoms include exacerbation with by mouth intake, no exertional or pleuritic pain. Symptoms are also worse with supine positioning. Patient has been evaluated for these symptoms with both gastroenterology and cardiology. He has a notable history of prior bypass surgery, has annual stress test, that are largely reassuring, and is scheduled for additional stress test next week.   Past Medical History  Diagnosis Date  . HTN (hypertension) since 2008  . Hyperlipemia   . Hypothyroidism   . Hernia   . Dizziness and giddiness   . Coronary artery disease 2011  . Prostate cancer   . Coronary atherosclerosis of native coronary artery 12/18/2012    CABG LIMA to LAD, SVG to circumflex 01/2009   Past Surgical History  Procedure Laterality Date  . Prostate surgery  2006  . Coronary artery bypass graft  2011   Family History  Problem Relation Age of Onset  . Dementia Mother   . High blood pressure Mother   . Lung cancer Mother   . Diabetes type II Mother   . Heart Problems Father   . Diabetes type II Father    History  Substance Use Topics  . Smoking status: Never Smoker   . Smokeless tobacco: Never Used  . Alcohol Use: 1.2 oz/week    2 Cans of beer per week     Comment: OCC    Review of Systems  Constitutional:       Per HPI, otherwise negative  HENT:       Per HPI, otherwise negative  Respiratory:       Per HPI, otherwise negative  Cardiovascular:       Per HPI, otherwise negative  Gastrointestinal: Negative for vomiting.  Endocrine:       Negative aside from HPI  Genitourinary:      Neg aside from HPI   Musculoskeletal:       Per HPI, otherwise negative  Skin: Negative.   Neurological: Negative for syncope.      Allergies  Sulfa antibiotics and Tetracyclines & related  Home Medications   Prior to Admission medications   Medication Sig Start Date End Date Taking? Authorizing Provider  aspirin 81 MG tablet Take 81 mg by mouth 2 (two) times daily.    Yes Historical Provider, MD  carboxymethylcellulose (REFRESH PLUS) 0.5 % SOLN Place 1 drop into both eyes 2 (two) times daily.   Yes Historical Provider, MD  CRESTOR 40 MG tablet TAKE 1 TABLET BY MOUTH EVERY DAY 06/16/14  Yes Jerline Pain, MD  ezetimibe (ZETIA) 10 MG tablet Take 1 tablet (10 mg total) by mouth daily. 08/06/13  Yes Jerline Pain, MD  lisinopril (PRINIVIL,ZESTRIL) 5 MG tablet TAKE 1 TABLET BY MOUTH EVERY DAY 07/06/14  Yes Jerline Pain, MD  metoprolol succinate (TOPROL-XL) 50 MG 24 hr tablet Take 1 tablet (50 mg total) by mouth daily. Take with or immediately following a meal. 05/11/14  Yes Jerline Pain, MD  Multiple Vitamins-Minerals (MULTIVITAMIN PO) Take by mouth daily.   Yes Historical Provider, MD  naphazoline-pheniramine (NAPHCON-A) 0.025-0.3 %  ophthalmic solution Place 1 drop into both eyes daily.   Yes Historical Provider, MD  omeprazole (PRILOSEC) 40 MG capsule Take 1 capsule (40 mg total) by mouth as needed. Patient taking differently: Take 40 mg by mouth daily.  12/10/13  Yes Jerline Pain, MD  CIALIS 5 MG tablet Take 5 mg by mouth daily as needed for erectile dysfunction. As directed 05/29/14   Historical Provider, MD  clopidogrel (PLAVIX) 75 MG tablet Take 1 tablet (75 mg total) by mouth once. 03/11/13   Jerline Pain, MD  nitroGLYCERIN (NITROSTAT) 0.4 MG SL tablet Place 0.4 mg under the tongue every 5 (five) minutes as needed for chest pain.    Historical Provider, MD   BP 138/72 mmHg  Pulse 54  Temp(Src) 98.6 F (37 C) (Oral)  Resp 17  Ht 6\' 1"  (1.854 m)  Wt 200 lb (90.719 kg)  BMI 26.39  kg/m2  SpO2 97% Physical Exam  Constitutional: He is oriented to person, place, and time. He appears well-developed. No distress.  HENT:  Head: Normocephalic and atraumatic.  Eyes: Conjunctivae and EOM are normal.  Cardiovascular: Normal rate and regular rhythm.   Pulmonary/Chest: Effort normal. No stridor. No respiratory distress.  Abdominal: He exhibits no distension.  Musculoskeletal: He exhibits no edema.  Neurological: He is alert and oriented to person, place, and time.  Skin: Skin is warm and dry.  Psychiatric: He has a normal mood and affect.  Nursing note and vitals reviewed.   ED Course  Procedures (including critical care time) Labs Review Labs Reviewed  COMPREHENSIVE METABOLIC PANEL - Abnormal; Notable for the following:    Glucose, Bld 112 (*)    Creatinine, Ser 1.25 (*)    Calcium 8.7 (*)    Alkaline Phosphatase 33 (*)    GFR calc non Af Amer 59 (*)    All other components within normal limits  CBC  TROPONIN I  TSH  CK    Imaging Review Dg Chest 2 View  07/09/2014   CLINICAL DATA:  Left upper chest pain  EXAM: CHEST - 2 VIEW  COMPARISON:  07/18/2010  FINDINGS: Cardiac shadow is stable. Postsurgical changes are again seen. No focal infiltrate or sizable effusion is seen. No bony abnormality is noted.  IMPRESSION: No active disease.   Electronically Signed   By: Inez Catalina M.D.   On: 07/09/2014 19:06    EKG with sinus rhythm, rate 59, artifact, ST changes, borderline study Cardiac monitor had rate 55, sinus bradycardia, abnormal Pulse oximetry was 97% room air normal  On repeat exam the patient appeared calm. He, his wife and I discussed all findings, return precautions, follow-up instructions.  MDM   Final diagnoses:  Atypical chest pain   Patient presents with ongoing episodes of chest pain. Given the persistency of pain, his negative troponin, nonischemic DGR very reassuring for low suspicion of ACS. Patient's description of symptoms are more  likely gastroesophageal in origin. With reassuring findings, no ongoing complaints, is discharged in stable condition to follow-up with GI and cardiology.  Carmin Muskrat, MD 07/10/14 [phone]

## 2014-07-10 NOTE — Telephone Encounter (Signed)
Encounter complete. 

## 2014-07-13 ENCOUNTER — Other Ambulatory Visit: Payer: Self-pay | Admitting: Cardiology

## 2014-07-14 ENCOUNTER — Telehealth (HOSPITAL_COMMUNITY): Payer: Self-pay

## 2014-07-14 ENCOUNTER — Ambulatory Visit (HOSPITAL_COMMUNITY): Payer: Self-pay

## 2014-07-14 NOTE — Telephone Encounter (Signed)
Encounter complete. 

## 2014-07-15 ENCOUNTER — Ambulatory Visit (HOSPITAL_COMMUNITY)
Admission: RE | Admit: 2014-07-15 | Discharge: 2014-07-15 | Disposition: A | Discharge status: Home / Self Care | Payer: Commercial Managed Care - HMO | Source: Ambulatory Visit | Attending: Cardiovascular Disease | Admitting: Cardiovascular Disease

## 2014-07-15 DIAGNOSIS — R0789 Other chest pain: Secondary | ICD-10-CM | POA: Insufficient documentation

## 2014-07-15 LAB — MYOCARDIAL PERFUSION IMAGING
CHL CUP NUCLEAR SRS: 1
CHL CUP NUCLEAR SSS: 1
LVDIAVOL: 135 mL
LVSYSVOL: 63 mL
Peak HR: 82 {beats}/min
Rest HR: 50 {beats}/min
SDS: 0
TID: 1.13

## 2014-07-15 MED ORDER — TECHNETIUM TC 99M SESTAMIBI GENERIC - CARDIOLITE
10.8000 | Freq: Once | INTRAVENOUS | Status: AC | PRN
  Administered 2014-07-15: 11 via INTRAVENOUS

## 2014-07-15 MED ORDER — TECHNETIUM TC 99M SESTAMIBI GENERIC - CARDIOLITE
30.9000 | Freq: Once | INTRAVENOUS | Status: AC | PRN
  Administered 2014-07-15: 30.9 via INTRAVENOUS

## 2014-07-15 MED ORDER — REGADENOSON 0.4 MG/5ML IV SOLN
0.4000 mg | Freq: Once | INTRAVENOUS | Status: AC
  Administered 2014-07-15: 0.4 mg via INTRAVENOUS

## 2014-07-16 ENCOUNTER — Telehealth: Payer: Self-pay | Admitting: Cardiology

## 2014-07-16 ENCOUNTER — Other Ambulatory Visit (INDEPENDENT_AMBULATORY_CARE_PROVIDER_SITE_OTHER): Payer: Commercial Managed Care - HMO | Admitting: *Deleted

## 2014-07-16 DIAGNOSIS — I10 Essential (primary) hypertension: Secondary | ICD-10-CM | POA: Diagnosis not present

## 2014-07-16 DIAGNOSIS — Z79899 Other long term (current) drug therapy: Secondary | ICD-10-CM | POA: Diagnosis not present

## 2014-07-16 DIAGNOSIS — E785 Hyperlipidemia, unspecified: Secondary | ICD-10-CM

## 2014-07-16 LAB — LIPID PANEL
CHOL/HDL RATIO: 3
Cholesterol: 133 mg/dL (ref 0–200)
HDL: 42.3 mg/dL (ref >39.00)
LDL Cholesterol: 72 mg/dL (ref 0–99)
NonHDL: 90.7
TRIGLYCERIDES: 92 mg/dL (ref 0.0–149.0)
VLDL: 18.4 mg/dL (ref 0.0–40.0)

## 2014-07-16 LAB — BASIC METABOLIC PANEL
BUN: 19 mg/dL (ref 6–23)
CALCIUM: 8.9 mg/dL (ref 8.4–10.5)
CO2: 28 meq/L (ref 19–32)
Chloride: 107 mEq/L (ref 96–112)
Creatinine, Ser: 1.22 mg/dL (ref 0.40–1.50)
GFR: 63.33 mL/min (ref >60.00)
Glucose, Bld: 93 mg/dL (ref 70–99)
POTASSIUM: 4.4 meq/L (ref 3.5–5.1)
SODIUM: 143 meq/L (ref 135–145)

## 2014-07-16 LAB — ALT: ALT: 32 U/L (ref 0–53)

## 2014-07-16 NOTE — Telephone Encounter (Signed)
Left message - will continue to attempt to call again

## 2014-07-16 NOTE — Telephone Encounter (Signed)
Pt returned call. Call the Home phone which is actually his mobile.

## 2014-07-16 NOTE — Telephone Encounter (Signed)
Left message for pt to call back for results.

## 2014-07-16 NOTE — Telephone Encounter (Signed)
Follow Up   Pt returned call for test results

## 2014-07-17 NOTE — Telephone Encounter (Signed)
Reviewed results of testing with pt who states understanding. 

## 2014-07-21 ENCOUNTER — Ambulatory Visit (INDEPENDENT_AMBULATORY_CARE_PROVIDER_SITE_OTHER): Payer: Commercial Managed Care - HMO | Admitting: Cardiology

## 2014-07-21 ENCOUNTER — Telehealth: Payer: Self-pay | Admitting: Cardiology

## 2014-07-21 ENCOUNTER — Other Ambulatory Visit: Payer: Self-pay | Admitting: *Deleted

## 2014-07-21 ENCOUNTER — Encounter: Payer: Self-pay | Admitting: Cardiology

## 2014-07-21 VITALS — BP 128/70 | HR 55 | Ht 73.0 in | Wt 200.2 lb

## 2014-07-21 DIAGNOSIS — I2510 Atherosclerotic heart disease of native coronary artery without angina pectoris: Secondary | ICD-10-CM

## 2014-07-21 DIAGNOSIS — K219 Gastro-esophageal reflux disease without esophagitis: Secondary | ICD-10-CM | POA: Diagnosis not present

## 2014-07-21 DIAGNOSIS — I10 Essential (primary) hypertension: Secondary | ICD-10-CM

## 2014-07-21 DIAGNOSIS — K224 Dyskinesia of esophagus: Secondary | ICD-10-CM | POA: Insufficient documentation

## 2014-07-21 MED ORDER — NITROGLYCERIN 0.4 MG SL SUBL: 0.4000 mg | SUBLINGUAL_TABLET | SUBLINGUAL | Status: DC | PRN

## 2014-07-21 MED ORDER — AMLODIPINE BESYLATE 5 MG PO TABS: 5.0000 mg | ORAL_TABLET | Freq: Every day | ORAL | Status: DC | PRN

## 2014-07-21 NOTE — Telephone Encounter (Signed)
Does not use at same time. Please make sure he is aware. Candee Furbish, MD

## 2014-07-21 NOTE — Telephone Encounter (Signed)
Left message for pt since we received the call from [facility] we want to make sure he doesn't use SL Ntg and Cialis at the same time.  Requested he call back if any questions or concerns.

## 2014-07-21 NOTE — Patient Instructions (Signed)
Medication Instructions:  Please take Amlodipine 5 mg a day as needed. Continue all other medications as listed.  Follow-Up: Follow up in 6 months with Dr. Marlou Porch.  You will receive a letter in the mail 2 months before you are due.  Please call us when you receive this letter to schedule your follow up appointment.  Thank you for choosing [facility]!!

## 2014-07-21 NOTE — Progress Notes (Signed)
[name]. [address], [address] [place], [place]  [address] Phone: [phone] Fax:  [phone]  Date:  07/21/2014   ID:  Collin Morse, DOB 10-Sep-1950, MRN [id number]  PCP:  Pcp Not In System   History of Present Illness: Collin Morse is a 64 y.o. male with prior two-vessel bypass, LIMA to LAD, SVG to circumflex 01/2009, hypertension, hyperlipidemia here for follow-up of symptoms of throat tightness, palpitations . Previously diagnosed with esophageal spasms. His symptoms previously were contained after proton pump inhibitor.   On 07/09/14 he presented to the emergency room with somewhat atypical chest pain symptoms, more like GERD in origin. Possible esophageal spasm. He does have some left arm radiation with this. Troponins were normal. Nuclear stress test once again reassuring. I gave him amlodipine to take as needed.  He was also worried about palpitation that woke him up from sleep in the past. Please see below for details. Had a golf outing and did well after the first day. Nuclear stress test October of 2013 following bypass was low risk. Subsequent nuclear stress test in July 2060 and was also low risk.  He takes Plavix when he needs to stop his aspirin for allergy shots on occasion. He does not take Plavix on a daily basis.  Wt Readings from Last 3 Encounters:  07/21/14 200 lb 4 oz (90.833 kg)  07/15/14 198 lb (89.812 kg)  07/09/14 200 lb (90.719 kg)     Past Medical History  Diagnosis Date  . HTN (hypertension) since 2008  . Hyperlipemia   . Hypothyroidism   . Hernia   . Dizziness and giddiness   . Coronary artery disease 2011  . Prostate cancer   . Coronary atherosclerosis of native coronary artery 12/18/2012    CABG LIMA to LAD, SVG to circumflex 01/2009    Past Surgical History  Procedure Laterality Date  . Prostate surgery  2006  . Coronary artery bypass graft  2011    Current Outpatient Prescriptions  Medication Sig Dispense Refill  . aspirin 81 MG  tablet Take 81 mg by mouth 2 (two) times daily.     . carboxymethylcellulose (REFRESH PLUS) 0.5 % SOLN Place 1 drop into both eyes 2 (two) times daily.    Marland Kitchen CIALIS 5 MG tablet Take 5 mg by mouth daily as needed for erectile dysfunction. As directed  6  . clopidogrel (PLAVIX) 75 MG tablet Take 1 tablet (75 mg total) by mouth once. (Patient taking differently: Take 75 mg by mouth once. Take annually with allergy shot) 90 tablet 3  . CRESTOR 40 MG tablet TAKE 1 TABLET BY MOUTH EVERY DAY 30 tablet 1  . lisinopril (PRINIVIL,ZESTRIL) 5 MG tablet TAKE 1 TABLET BY MOUTH EVERY DAY 30 tablet 6  . metoprolol succinate (TOPROL-XL) 50 MG 24 hr tablet Take 1 tablet (50 mg total) by mouth daily. Take with or immediately following a meal. 30 tablet 0  . Multiple Vitamins-Minerals (MULTIVITAMIN PO) Take by mouth daily.    . nitroGLYCERIN (NITROSTAT) 0.4 MG SL tablet Place 0.4 mg under the tongue every 5 (five) minutes as needed for chest pain.    Marland Kitchen omeprazole (PRILOSEC) 40 MG capsule Take 1 capsule (40 mg total) by mouth as needed. (Patient taking differently: Take 40 mg by mouth daily. ) 30 capsule 6  . ZETIA 10 MG tablet TAKE 1 TABLET (10 MG TOTAL) BY MOUTH DAILY. 90 tablet 0  . naphazoline-pheniramine (NAPHCON-A) 0.025-0.3 % ophthalmic solution Place  1 drop into both eyes daily.     No current facility-administered medications for this visit.    Allergies:    Allergies  Allergen Reactions  . Sulfa Antibiotics Nausea And Vomiting    Makes sick   . Tetracyclines & Related Nausea And Vomiting    Makes sick    Social History:  The patient  reports that he has never smoked. He has never used smokeless tobacco. He reports that he drinks about 1.2 oz of alcohol per week. He reports that he does not use illicit drugs.   ROS:  Please see the history of present illness.   No CP, SOB, no fevers.   PHYSICAL EXAM: VS:  BP 128/70 mmHg  Pulse 55  Ht 6\' 1"  (1.854 m)  Wt 200 lb 4 oz (90.833 kg)  BMI 26.43 kg/m2   SpO2 97% Well nourished, well developed, in no acute distress HEENT: normal Neck: no JVD Cardiac:  normal S1, S2; mild brady RR; no murmur Lungs:  clear to auscultation bilaterally, no wheezing, rhonchi or rales Abd: soft, nontender, no hepatomegaly Ext: no edema Skin: warm and dry Neuro: no focal abnormalities noted  Nuclear stress test: 07/15/14-low risk, no ischemia, normal EF  Labs: 07/16/14-LDL 72  EKG:  07/15/13-sinus bradycardia 53, left axis deviation.  10/22/13-sinus bradycardia rate 57, left axis deviation, nonspecific ST-T wave changes  ASSESSMENT AND PLAN:  1. Coronary artery disease/angina/palpitation- previously had an episode of burning in his chest, throat that was associated with tail gating, spicy food. Last night he had another episode of some burning when laying down, he thinks it may be esophageal. What really scared him was an isolated palpitation or 2, one of which woke him up from sleep. He had an isolated thump. He has had these in the past. He had a similar experience last year while playing golf. Stress test ago was low risk. Previous symptoms were relieved with Prevacid.  Continue with current plan. Prior bypass. Cardiac maintenance/rehabilitation. Enjoys playing golf. Continue to hydrate well.  I have given him amlodipine 5 mg on an as-needed basis if he is going through a run of what he thinks is esophageal spasm. I do not one-handed take it on a daily basis because his blood pressures can get fairly low. 2. GERD-omeprazole, no current issues 3. Hyperlipidemia-LDL 74, excellent, Crestor 40. No changes made in medications. 4. Hypertension-blood pressure previously low at 98 systolic. At that time we decreased his lisinopril to 5 mg from 10. He seems to overall be doing much better. Occasional orthostatic like symptoms. Continue to monitor.  We will continue with metoprolol for antianginal support. 5. Impaired glucose tolerance-hemoglobin A1C was 6.0.  6. I will see  him back in 6 months  Signed, Candee Furbish, MD [facility]  07/21/2014 10:00 AM

## 2014-07-21 NOTE — Telephone Encounter (Signed)
New message      Pt is on cialis and presc was written today for nitrostat.  Calling to make doctor aware of possible drug interaction

## 2014-08-12 ENCOUNTER — Other Ambulatory Visit: Payer: Self-pay

## 2014-08-12 MED ORDER — METOPROLOL SUCCINATE ER 50 MG PO TB24: 50.0000 mg | ORAL_TABLET | Freq: Every day | ORAL | Status: DC

## 2014-08-13 ENCOUNTER — Other Ambulatory Visit: Payer: Self-pay | Admitting: Cardiology

## 2014-11-04 ENCOUNTER — Other Ambulatory Visit: Payer: Self-pay | Admitting: Cardiology

## 2015-01-01 ENCOUNTER — Other Ambulatory Visit: Payer: Self-pay | Admitting: Cardiology

## 2015-02-04 ENCOUNTER — Other Ambulatory Visit: Payer: Self-pay

## 2015-02-04 MED ORDER — METOPROLOL SUCCINATE ER 50 MG PO TB24: 50.0000 mg | ORAL_TABLET | Freq: Every day | ORAL | Status: DC

## 2015-02-04 NOTE — Telephone Encounter (Signed)
Jerline Pain, MD at 07/21/2014 9:39 AM  metoprolol succinate (TOPROL-XL) 50 MG 24 hr tabletTake 1 tablet (50 mg total) by mouth daily. Take with or immediately following a meal 1. Hypertension-blood pressure previously low at 98 systolic. At that time we decreased his lisinopril to 5 mg from 10. He seems to overall be doing much better. Occasional orthostatic like symptoms. Continue to monitor. We will continue with metoprolol for antianginal support

## 2015-02-11 ENCOUNTER — Other Ambulatory Visit: Payer: Self-pay | Admitting: Cardiology

## 2015-02-14 ENCOUNTER — Other Ambulatory Visit: Payer: Self-pay | Admitting: Cardiology

## 2015-03-02 ENCOUNTER — Encounter: Payer: Self-pay | Admitting: Cardiology

## 2015-03-02 ENCOUNTER — Ambulatory Visit (INDEPENDENT_AMBULATORY_CARE_PROVIDER_SITE_OTHER): Payer: Commercial Managed Care - HMO | Admitting: Cardiology

## 2015-03-02 VITALS — BP 148/80 | HR 61 | Ht 73.0 in | Wt 208.8 lb

## 2015-03-02 DIAGNOSIS — I10 Essential (primary) hypertension: Secondary | ICD-10-CM | POA: Diagnosis not present

## 2015-03-02 DIAGNOSIS — E785 Hyperlipidemia, unspecified: Secondary | ICD-10-CM | POA: Diagnosis not present

## 2015-03-02 DIAGNOSIS — I2510 Atherosclerotic heart disease of native coronary artery without angina pectoris: Secondary | ICD-10-CM | POA: Diagnosis not present

## 2015-03-02 DIAGNOSIS — R002 Palpitations: Secondary | ICD-10-CM

## 2015-03-02 MED ORDER — LISINOPRIL 5 MG PO TABS: 5.0000 mg | ORAL_TABLET | Freq: Every day | ORAL | Status: DC

## 2015-03-02 NOTE — Progress Notes (Signed)
[facility]. [address], [address] [place],   [address] Phone: [phone] Fax:  [phone]  Date:  03/02/2015   ID:  Collin Morse, DOB [birth year], MRN [id number]  PCP:  Pcp Not In System   History of Present Illness: Collin Morse is a 65 y.o. male with prior two-vessel bypass, LIMA to LAD, SVG to circumflex 01/2009, hypertension, hyperlipidemia here for follow-up of symptoms of throat tightness, palpitations . Previously diagnosed with esophageal spasms. His symptoms previously were contained after proton pump inhibitor.   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  On 07/09/14 he presented to the emergency room with somewhat atypical chest pain symptoms, more like GERD in origin. Possible esophageal spasm. He does have some left arm radiation with this. Troponins were normal. Nuclear stress test once again reassuring. I gave him amlodipine to take as needed.  He was also worried about palpitation that woke him up from sleep in the past. Bend over trigger,  Please see below for details. Had a golf outing and did well after the first day. Nuclear stress test October of 2013 following bypass was low risk. Subsequent nuclear stress test in July 2016 was also low risk.  He takes Plavix when he needs to stop his aspirin for allergy shots on occasion. He does not take Plavix on a daily basis.  Wt Readings from Last 3 Encounters:  03/02/15 208 lb 12.8 oz (94.711 kg)  07/21/14 200 lb 4 oz (90.833 kg)  07/15/14 198 lb (89.812 kg)     Past Medical History  Diagnosis Date  . HTN (hypertension) since 2008  . Hyperlipemia   . Hypothyroidism   . Hernia   . Dizziness and giddiness   . Coronary artery disease 2011  . Prostate cancer ([place])   . Coronary atherosclerosis of native coronary artery 12/18/2012    CABG LIMA to LAD, SVG to circumflex 01/2009    Past Surgical History  Procedure Laterality Date  . Prostate surgery  2006  . Coronary artery bypass graft  2011    Current  Outpatient Prescriptions  Medication Sig Dispense Refill  . amLODipine (NORVASC) 5 MG tablet Take 1 tablet (5 mg total) by mouth daily as needed. 30 tablet 6  . aspirin 81 MG tablet Take 81 mg by mouth 2 (two) times daily.     Marland Kitchen CIALIS 5 MG tablet Take 5 mg by mouth daily as needed for erectile dysfunction. As directed  6  . clopidogrel (PLAVIX) 75 MG tablet Take 1 tablet (75 mg total) by mouth once. (Patient taking differently: Take 75 mg by mouth once. Take annually with allergy shot) 90 tablet 3  . lisinopril (PRINIVIL,ZESTRIL) 5 MG tablet TAKE 1 TABLET BY MOUTH EVERY DAY 30 tablet 6  . metoprolol succinate (TOPROL-XL) 50 MG 24 hr tablet Take 1 tablet (50 mg total) by mouth daily. Take with or immediately following a meal. 30 tablet 1  . Multiple Vitamins-Minerals (MULTIVITAMIN PO) Take by mouth daily.    . nitroGLYCERIN (NITROSTAT) 0.4 MG SL tablet Place 1 tablet (0.4 mg total) under the tongue every 5 (five) minutes as needed for chest pain. 25 tablet 11  . omeprazole (PRILOSEC) 40 MG capsule Take 1 capsule (40 mg total) by mouth as needed. (Patient taking differently: Take 40 mg by mouth daily. ) 30 capsule 6  . rosuvastatin (CRESTOR) 40 MG tablet TAKE 1 TABLET BY MOUTH EVERY DAY 30 tablet 0  . ZETIA 10 MG tablet TAKE 1  TABLET (10 MG TOTAL) BY MOUTH DAILY. 90 tablet 0   No current facility-administered medications for this visit.    Allergies:    Allergies  Allergen Reactions  . Sulfa Antibiotics Nausea And Vomiting    Makes sick   . Tetracyclines & Related Nausea And Vomiting    Makes sick    Social History:  The patient  reports that he has never smoked. He has never used smokeless tobacco. He reports that he drinks about 1.2 oz of alcohol per week. He reports that he does not use illicit drugs.   ROS:  Please see the history of present illness.   No CP, SOB, no fevers.   PHYSICAL EXAM: VS:  BP 148/80 mmHg  Pulse 61  Ht 6\' 1"  (1.854 m)  Wt 208 lb 12.8 oz (94.711 kg)  BMI  27.55 kg/m2  SpO2 97% Well nourished, well developed, in no acute distress HEENT: normal Neck: no JVD Cardiac:  normal S1, S2; mild brady RR; no murmur Lungs:  clear to auscultation bilaterally, no wheezing, rhonchi or rales Abd: soft, nontender, no hepatomegaly Ext: no edema Skin: warm and dry Neuro: no focal abnormalities noted  Nuclear stress test: 07/15/14-low risk, no ischemia, normal EF  Labs: 07/16/14-LDL 72  EKG:  07/15/13-sinus bradycardia 53, left axis deviation.  10/22/13-sinus bradycardia rate 57, left axis deviation, nonspecific ST-T wave changes  48 hour monitor - OK. No adverse arrhythmias  ASSESSMENT AND PLAN:  1. Coronary artery disease/angina/palpitation- . Stress test July 2016 low risk, no ischemia. Continue with current plan. Prior bypass. Cardiac maintenance/rehabilitationparticipation in the past. He is no longer doing this but he is eager to go to the gymnasium. Enjoys playing golf. Continue to hydrate well.  I have given him amlodipine 5 mg on an as-needed basis if he is going through a run of what he thinks is esophageal spasm. I do not want him to take it on a daily basis because his blood pressures can get fairly low. 2. GERD-omeprazole, no current issues 3. Hyperlipidemia-LDL 72, excellent, Crestor 40. Zetia 10No changes made in medications. 4. Palpitations-we spent the majority of today's visit discussing his palpitations. Sometimes this wakes him up from a daily nap. He describes compensatory pause. Very brief shortness of breath. PVCs. Prior Holter monitor reassuring. Continue with metoprolol. 5. Hypertension-blood pressure previously low at 98 systolic. At that time we decreased his lisinopril to 5 mg from 10. He seems to overall be doing much better. Occasional orthostatic like symptoms(had symptoms once when bending over to grab a golf ball). Continue to monitor.  We will continue with metoprolol for antianginal support. 6. Impaired glucose  tolerance-hemoglobin A1C was 6.0. Discussed weight loss. 7. I will see him back in 12 months  Signed, Candee Furbish, MD [facility]  03/02/2015 8:44 AM

## 2015-03-02 NOTE — Patient Instructions (Signed)

## 2015-03-17 ENCOUNTER — Other Ambulatory Visit: Payer: Self-pay | Admitting: Cardiology

## 2015-04-03 ENCOUNTER — Other Ambulatory Visit: Payer: Self-pay | Admitting: Cardiology

## 2015-05-16 ENCOUNTER — Other Ambulatory Visit: Payer: Self-pay | Admitting: Cardiology

## 2015-07-15 ENCOUNTER — Other Ambulatory Visit: Payer: Self-pay | Admitting: *Deleted

## 2015-07-15 MED ORDER — EZETIMIBE 10 MG PO TABS: 10.0000 mg | ORAL_TABLET | Freq: Every day | ORAL | Status: DC

## 2015-07-21 ENCOUNTER — Telehealth: Payer: Self-pay | Admitting: Cardiology

## 2015-07-21 NOTE — Telephone Encounter (Signed)
Returned call to patient.He stated he would like appointment tomorrow.Stated for the past 2 weeks his heart has been skipping.Stated he has had in the past,but this skip feels different.He has been light headed.No chest pain.No extender appointments available this week.Advised I will send message to Dr.Skain's nurse Cherre Huger RN for appointment.

## 2015-07-21 NOTE — Telephone Encounter (Signed)
Follow up  Pt is having many PVCs  Patient c/o Palpitations:  High priority if patient c/o lightheadedness and shortness of breath.  1. How long have you been having palpitations? weeks  2. Are you currently experiencing lightheadedness and shortness of breath? no  3. Have you checked your BP and heart rate? (document readings) no  4. Are you experiencing any other symptoms? GI issues

## 2015-07-22 NOTE — Telephone Encounter (Signed)
F/u  Pt following up from previous message- Please call back and discuss.

## 2015-07-22 NOTE — Telephone Encounter (Signed)
Spoke with pt who is reporting over the past 2 weeks he has noticed an increase in "PVCs" and is feeling more palps especially when laying down at night.  He has also been having the same tightness in his neck and bloating that he had last summer about this same time.  He had previously been given amlodipine to use prn for these s/s but has not tried it.  Advised to increase water intake and decrease caffiene and alcohol as both are stimulants.  He admits to drinking "a ton of decaffeinated tea" and sometimes wine with dinner.  He has noticed drinking wine causes increased s/s as well.  Advised even decaff products like tea and coffee still has 30 to 40% caffeine and he should decrease his intake of this.  He stated understanding.  For now he will decrease caffeine intake, increase water intake and use Amlodipine prn as instructed.  If he continues to experience palps/PVCs he will then try stopping his Lisinopril and increasing Metoprolol to 75 mg a day as instructed by Dr Marlou Porch.  He will call back if further concerns otherwise he will f/u Aug 2 at 10 AM with Dr Marlou Porch.

## 2015-08-05 ENCOUNTER — Telehealth: Payer: Self-pay | Admitting: Cardiology

## 2015-08-05 NOTE — Telephone Encounter (Signed)
Patient c/o Palpitations:  High priority if patient c/o lightheadedness and shortness of breath.  1. How long have you been having palpitations? Today 7/27  2. Are you currently experiencing lightheadedness and shortness of breath?lightheaded  3. Have you checked your BP and heart rate? (document readings) no  4. Are you experiencing any other symptoms? Tightness in neck/throat

## 2015-08-05 NOTE — Telephone Encounter (Signed)
Spoke with pt who is reporting he did go ahead and stop his Lisinopril and increased Metoprolol to 75 mg a day on 7/21.  He has also taken Amlodipine a few days but not consistently and not prn while having palpitations.  He reports he didn't understand how he was supposed to be taking it.  He reports over the weekend after walking a lot in [place] he noticed an increase in his palpitations at bedtime.  He also had tightness in throat as before and abdominal bloating.  This occurred after taking a trip with his grandson to the zoo as well.  He is asking what to do.  Advised he should take the Amlodipine 5 mg as needed and as directed if/when having palpitations.  Also advised he can try splitting the dose of his Metoprolol to 50 mg a bedtime and 25 mg in the AM since he seems to have more palpitations at night.  He will try making these changes for possible improvement and f/u as scheduled with Dr Marlou Porch next week.  He will c/b prior to then if questions or concerns.

## 2015-08-05 NOTE — Telephone Encounter (Signed)
He also reported he has stopped all caffeine and alcohol.

## 2015-08-11 ENCOUNTER — Ambulatory Visit (INDEPENDENT_AMBULATORY_CARE_PROVIDER_SITE_OTHER): Payer: Commercial Managed Care - HMO | Admitting: Cardiology

## 2015-08-11 ENCOUNTER — Encounter: Payer: Self-pay | Admitting: Cardiology

## 2015-08-11 VITALS — BP 130/74 | HR 55 | Ht 73.0 in | Wt 204.2 lb

## 2015-08-11 DIAGNOSIS — K224 Dyskinesia of esophagus: Secondary | ICD-10-CM

## 2015-08-11 DIAGNOSIS — R002 Palpitations: Secondary | ICD-10-CM | POA: Diagnosis not present

## 2015-08-11 DIAGNOSIS — I25118 Atherosclerotic heart disease of native coronary artery with other forms of angina pectoris: Secondary | ICD-10-CM

## 2015-08-11 MED ORDER — AMLODIPINE BESYLATE 5 MG PO TABS: 5.0000 mg | ORAL_TABLET | Freq: Every day | ORAL | 6 refills | Status: DC | PRN

## 2015-08-11 MED ORDER — METOPROLOL TARTRATE 50 MG PO TABS: 50.0000 mg | ORAL_TABLET | ORAL | 11 refills | Status: DC

## 2015-08-11 MED ORDER — NITROGLYCERIN 0.4 MG SL SUBL: 0.4000 mg | SUBLINGUAL_TABLET | SUBLINGUAL | 11 refills | Status: DC | PRN

## 2015-08-11 NOTE — Patient Instructions (Addendum)
Medication Instructions:  1. CHANGE METOPROLOL TO 25 MG IN THE MORNING AND 50 MG IN THE PM; NEW RX SENT  Labwork: NONE  Testing/Procedures: NONE  Follow-Up: 02/2016 WITH DR. Marlou Porch; WE WILL SEND OUT A REMINDER LETTER 2-3 MONTHS EARLY TO CALL AND MAKE APPT  Any Other Special Instructions Will Be Listed Below (If Applicable).     If you need a refill on your cardiac medications before your next appointment, please call your pharmacy.

## 2015-08-11 NOTE — Progress Notes (Signed)
[place]. [address] [place], [place]  [address] Phone: [phone] Fax:  [phone]  Date:  08/11/2015   ID:  Collin Morse, DOB October 25, 1950, MRN [id number]  PCP:  Pcp Not In System   History of Present Illness: Collin Morse is a 65 y.o. male with prior two-vessel bypass, LIMA to LAD, SVG to circumflex 01/2009, hypertension, hyperlipidemia here for follow-up of symptoms of throat tightness, palpitations . He called [place] up on 08/05/15 with palpitations, feeling somewhat lightheaded with some tightness in his throat and neck. He now feels as though he is back on track he states.  He split metoprolol at the advice of Pam - 50mg  PM, 25mg  AM.  Decreased tea intake. Better. Night triggers.   Previously diagnosed with esophageal spasms. His symptoms previously were contained after proton pump inhibitor.   [place], [name] wife.   Nuclear stress test October of 2013 following bypass was low risk.  Subsequent nuclear stress test in July 2016 was also low risk.  He takes Plavix when he needs to stop his aspirin for allergy shots on occasion. He does not take Plavix on a daily basis.  Wt Readings from Last 3 Encounters:  08/11/15 204 lb 3.2 oz (92.6 kg)  03/02/15 208 lb 12.8 oz (94.7 kg)  07/21/14 200 lb 4 oz (90.8 kg)     Past Medical History:  Diagnosis Date  . Coronary artery disease 2011  . Coronary atherosclerosis of native coronary artery 12/18/2012   CABG LIMA to LAD, SVG to circumflex 01/2009  . Dizziness and giddiness   . Hernia   . HTN (hypertension) since 2008  . Hyperlipemia   . Hypothyroidism   . Prostate cancer [facility])     Past Surgical History:  Procedure Laterality Date  . CORONARY ARTERY BYPASS GRAFT  2011  . PROSTATE SURGERY  2006    Current Outpatient Prescriptions  Medication Sig Dispense Refill  . amLODipine (NORVASC) 5 MG tablet Take 1 tablet (5 mg total) by mouth daily as needed. 30 tablet 6  . aspirin 81 MG tablet Take 81 mg by mouth 2  (two) times daily.     Marland Kitchen CIALIS 5 MG tablet Take 5 mg by mouth daily as needed for erectile dysfunction. As directed  6  . clopidogrel (PLAVIX) 75 MG tablet Take 75 mg by mouth daily. Patient takes this medication in the place of Asprin when he gets his allergy shot in January.    Marland Kitchen ezetimibe (ZETIA) 10 MG tablet Take 1 tablet (10 mg total) by mouth daily. 90 tablet 2  . metoprolol (LOPRESSOR) 50 MG tablet Take 1 tablet (50 mg total) by mouth as directed. Take 1/2 tab = 25 mg in the AM; take 1 whole tab = 50 mg in the PM 45 tablet 11  . Multiple Vitamins-Minerals (MULTIVITAMIN PO) Take by mouth daily.    . nitroGLYCERIN (NITROSTAT) 0.4 MG SL tablet Place 1 tablet (0.4 mg total) under the tongue every 5 (five) minutes as needed for chest pain. 25 tablet 11  . omeprazole (PRILOSEC) 40 MG capsule Take 40 mg by mouth daily.    . rosuvastatin (CRESTOR) 40 MG tablet TAKE 1 TABLET BY MOUTH EVERY DAY 30 tablet 11   No current facility-administered medications for this visit.     Allergies:    Allergies  Allergen Reactions  . Sulfa Antibiotics Nausea And Vomiting    Makes sick   . Tetracyclines & Related Nausea And  Vomiting    Makes sick    Social History:  The patient  reports that he has never smoked. He has never used smokeless tobacco. He reports that he drinks about 1.2 oz of alcohol per week . He reports that he does not use drugs.   ROS:  Please see the history of present illness.   No CP, SOB, no fevers.   PHYSICAL EXAM: VS:  BP 130/74   Pulse (!) 55   Ht 6\' 1"  (1.854 m)   Wt 204 lb 3.2 oz (92.6 kg)   BMI 26.94 kg/m  Well nourished, well developed, in no acute distress  HEENT: normal  Neck: no JVD  Cardiac:  normal S1, S2; mild brady RR; no murmur  Lungs:  clear to auscultation bilaterally, no wheezing, rhonchi or rales  Abd: soft, nontender, no hepatomegaly  Ext: no edema  Skin: warm and dry  Neuro: no focal abnormalities noted  Nuclear stress test: 07/15/14-low risk, no  ischemia, normal EF  Labs: 07/16/14-LDL 72  EKG:  EKG today 08/11/15-sinus bradycardia rate 55, left axis deviation, old inferior infarct pattern personally viewed-prior 07/15/13-sinus bradycardia 53, left axis deviation.  10/22/13-sinus bradycardia rate 57, left axis deviation, nonspecific ST-T wave changes  48 hour monitor - OK. No adverse arrhythmias  ASSESSMENT AND PLAN:  1. Coronary artery disease/angina/palpitation- after the heat of the summer, and [place], [place], he was having more palpitations in the evening hours that was worrisome to him. Throat tightness as well which has been for many years. Discussed with Pam who suggested taking 50 mg at bedtime and 25 mg in the morning of metoprolol. Since he has made that change, he is feeling much better. He feels as though he is back on track. Stress test July 2016 low risk, no ischemia. Continue with current plan. Prior bypass. Enjoys playing golf. Continue to hydrate well.  I have given him amlodipine 5 mg on an as-needed basis if he is going through a run of what he thinks is esophageal spasm. I do not want him to take it on a daily basis because his blood pressures can get fairly low. 2. GERD-omeprazole, no current issues 3. Hyperlipidemia-LDL 72, excellent, Crestor 40. Zetia 10No changes made in medications. 4. Palpitations-we spent the majority of today's visit discussing his palpitations. Sometimes this wakes him up from a daily nap. He describes compensatory pause. Very brief shortness of breath. PVCs. Prior Holter monitor reassuring. Continue with metoprolol. Now at 50 evening 25 AM 5. Hypertension-blood pressure previously low at 98 systolic. Lisinopril is off. He seems to overall be doing much better. Occasional orthostatic like symptoms(had symptoms once when bending over to grab a golf ball). Continue to monitor.  We will continue with metoprolol for antianginal support. 6. Impaired glucose tolerance-hemoglobin A1C was 6.0. Discussed  weight loss. 7. I will see him back in February 2018  Signed, Candee Furbish, MD [facility]  08/11/2015 11:00 AM

## 2015-09-10 ENCOUNTER — Other Ambulatory Visit: Payer: Self-pay | Admitting: *Deleted

## 2015-09-10 MED ORDER — METOPROLOL TARTRATE 50 MG PO TABS
ORAL_TABLET | ORAL | 3 refills | Status: DC
Start: 2015-09-10 — End: 2018-03-11

## 2015-11-30 ENCOUNTER — Encounter: Payer: Self-pay | Admitting: Cardiology

## 2016-01-31 ENCOUNTER — Telehealth: Payer: Self-pay | Admitting: Cardiology

## 2016-01-31 NOTE — Telephone Encounter (Signed)
Collin Morse is calling to see if he needs to have blood work completed before his appointment scheduled on 03/06/16. Please call, thanks.

## 2016-01-31 NOTE — Telephone Encounter (Signed)
Left message for pt that since he just had blood work at his PCP office in November 2017 he would not need any blood work prior to his upcoming appt with Dr Marlou Porch.  Requested he c/b if further questions or concerns.

## 2016-02-11 ENCOUNTER — Telehealth: Payer: Self-pay | Admitting: *Deleted

## 2016-02-11 MED ORDER — CLOPIDOGREL BISULFATE 75 MG PO TABS: 75.0000 mg | ORAL_TABLET | Freq: Every day | ORAL | 1 refills | Status: DC

## 2016-02-11 NOTE — Telephone Encounter (Signed)
  He takes Plavix when he needs to stop his aspirin for allergy shots on occasion. He does not take Plavix on a daily basis.  OK to fill - needs to keep upcoming appt.

## 2016-02-11 NOTE — Telephone Encounter (Signed)
Patient left a msg on the refill vm stating that he needs to go off of the asa and get a refill on the clopidogrel as he is getting an allergy shot this month. Okay to refill? Please advise. Thanks, MI

## 2016-02-18 ENCOUNTER — Other Ambulatory Visit: Payer: Self-pay | Admitting: Cardiology

## 2016-02-18 MED ORDER — CLOPIDOGREL BISULFATE 75 MG PO TABS: 75.0000 mg | ORAL_TABLET | Freq: Every day | ORAL | 0 refills | Status: DC

## 2016-02-25 ENCOUNTER — Encounter: Payer: Self-pay | Admitting: *Deleted

## 2016-03-04 ENCOUNTER — Other Ambulatory Visit: Payer: Self-pay | Admitting: Cardiology

## 2016-03-06 ENCOUNTER — Encounter (INDEPENDENT_AMBULATORY_CARE_PROVIDER_SITE_OTHER): Payer: Self-pay

## 2016-03-06 ENCOUNTER — Encounter: Payer: Self-pay | Admitting: Cardiology

## 2016-03-06 ENCOUNTER — Ambulatory Visit (INDEPENDENT_AMBULATORY_CARE_PROVIDER_SITE_OTHER): Payer: BLUE CROSS/BLUE SHIELD | Admitting: Cardiology

## 2016-03-06 VITALS — BP 162/80 | HR 66 | Ht 73.0 in | Wt 215.4 lb

## 2016-03-06 DIAGNOSIS — I10 Essential (primary) hypertension: Secondary | ICD-10-CM | POA: Diagnosis not present

## 2016-03-06 DIAGNOSIS — K219 Gastro-esophageal reflux disease without esophagitis: Secondary | ICD-10-CM

## 2016-03-06 DIAGNOSIS — E7800 Pure hypercholesterolemia, unspecified: Secondary | ICD-10-CM | POA: Diagnosis not present

## 2016-03-06 DIAGNOSIS — I209 Angina pectoris, unspecified: Secondary | ICD-10-CM | POA: Diagnosis not present

## 2016-03-06 DIAGNOSIS — I2510 Atherosclerotic heart disease of native coronary artery without angina pectoris: Secondary | ICD-10-CM | POA: Diagnosis not present

## 2016-03-06 DIAGNOSIS — R002 Palpitations: Secondary | ICD-10-CM

## 2016-03-06 MED ORDER — CLOPIDOGREL BISULFATE 75 MG PO TABS: 75.0000 mg | ORAL_TABLET | Freq: Every day | ORAL | 0 refills | Status: DC

## 2016-03-06 NOTE — Patient Instructions (Signed)

## 2016-03-06 NOTE — Progress Notes (Signed)
[place]. [address], [address] [place], [place]  [address] Phone: [phone] Fax:  [phone]  Date:  03/06/2016   ID:  Collin Morse, DOB 1950-07-13, MRN [id number]  PCP:  Pcp Not In System    History of Present Illness: Collin Morse is a 66 y.o. male with prior two-vessel bypass, LIMA to LAD, SVG to circumflex 01/2009, hypertension, hyperlipidemia here for follow-up of symptoms of throat tightness, palpitations . Previously on 08/05/15 with palpitations, feeling somewhat lightheaded with some tightness in his throat and neck. Interestingly, he says this is somewhat of a seasonal event in the summertime when the weather is hot. Perhaps the addition of amlodipine and changing of the metoprolol has helped.  He split metoprolol at the advice of Pam - 50mg  PM, 25mg  AM.  Decreased tea intake. Better. Night triggers, laying down for instance.   Previously diagnosed with esophageal spasms. His symptoms previously were contained after proton pump inhibitor.   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   Nuclear stress test October of 2013 following bypass was low risk.  Subsequent nuclear stress test in July 2016 was also low risk.  He takes Plavix when he needs to stop his aspirin for allergy shots on occasion, only for 30 days. He does not take Plavix on a daily basis.  No recent chest pain, syncope, bleeding, orthopnea  Wt Readings from Last 3 Encounters:  03/06/16 215 lb 6.4 oz (97.7 kg)  08/11/15 204 lb 3.2 oz (92.6 kg)  03/02/15 208 lb 12.8 oz (94.7 kg)     Past Medical History:  Diagnosis Date  . Coronary artery disease 2011  . Coronary atherosclerosis of native coronary artery 12/18/2012   CABG LIMA to LAD, SVG to circumflex 01/2009  . Dizziness and giddiness   . Hernia   . HTN (hypertension) since 2008  . Hyperlipemia   . Hypothyroidism   . Prostate cancer [facility])     Past Surgical History:  Procedure  Laterality Date  . CORONARY ARTERY BYPASS GRAFT  2011  . PROSTATE SURGERY  2006    Current Outpatient Prescriptions  Medication Sig Dispense Refill  . amLODipine (NORVASC) 5 MG tablet Take 1 tablet (5 mg total) by mouth daily as needed. 30 tablet 6  . aspirin 81 MG tablet Take 81 mg by mouth 2 (two) times daily.     Marland Kitchen CIALIS 5 MG tablet Take 5 mg by mouth daily as needed for erectile dysfunction. As directed  6  . clopidogrel (PLAVIX) 75 MG tablet Take 1 tablet (75 mg total) by mouth daily. Patient takes this medication in the place of Asprin when he gets his allergy shot in January. 30 tablet 0  . ezetimibe (ZETIA) 10 MG tablet Take 1 tablet (10 mg total) by mouth daily. 90 tablet 2  . metoprolol (LOPRESSOR) 50 MG tablet Take 1/2 tab (25 mg) by mouth in the AM & Take 1 whole tab (50 mg) by mouth in the PM 135 tablet 3  . Multiple Vitamins-Minerals (MULTIVITAMIN PO) Take by mouth daily.    . nitroGLYCERIN (NITROSTAT) 0.4 MG SL tablet Place 1 tablet (0.4 mg total) under the tongue every 5 (five) minutes as needed for chest pain. 25 tablet 11  . omeprazole (PRILOSEC) 40 MG capsule Take 40 mg by mouth daily.    . rosuvastatin (CRESTOR) 40 MG tablet TAKE 1 TABLET BY MOUTH EVERY DAY 30 tablet  11   No current facility-administered medications for this visit.     Allergies:    Allergies  Allergen Reactions  . Sulfa Antibiotics Nausea And Vomiting    Makes sick   . Tetracyclines & Related Nausea And Vomiting    Makes sick    Social History:  The patient  reports that he has never smoked. He has never used smokeless tobacco. He reports that he drinks about 1.2 oz of alcohol per week . He reports that he does not use drugs.   ROS:  Please see the history of present illness.   No CP, SOB, no fevers.   PHYSICAL EXAM: VS:  BP (!) 162/80 (BP Location: Left Arm)   Pulse 66   Ht 6\' 1"  (1.854 m)   Wt 215 lb 6.4 oz (97.7 kg)   BMI 28.42 kg/m  Well nourished, well developed, in no acute  distress  HEENT: normal  Neck: no JVD  Cardiac:  normal S1, S2; mild brady RR; no murmur  Lungs:  clear to auscultation bilaterally, no wheezing, rhonchi or rales  Abd: soft, nontender, no hepatomegaly  Ext: no edema  Skin: warm and dry  Neuro: no focal abnormalities noted  Nuclear stress test: 07/15/14-low risk, no ischemia, normal EF  Labs: 07/16/14-LDL 72  EKG:  EKG today 08/11/15-sinus bradycardia rate 55, left axis deviation, old inferior infarct pattern personally viewed-prior 07/15/13-sinus bradycardia 53, left axis deviation.  10/22/13-sinus bradycardia rate 57, left axis deviation, nonspecific ST-T wave changes  48 hour monitor - OK. No adverse arrhythmias  ASSESSMENT AND PLAN:  1. Coronary artery disease/angina/palpitation- after the heat of the summer, in [place], [place], he was having more palpitations in the evening hours that was worrisome to him. Throat tightness as well which has been for many years. Discussed with Pam who suggested taking 50 mg at bedtime and 25 mg in the morning of metoprolol. Since he has made that change, he is feeling much better. He feels as though he is back on track. Stress test July 2016 low risk, no ischemia. Continue with current plan. Prior bypass. Enjoys playing golf. Continue to hydrate well.  I have given him amlodipine 5 mg on an as-needed basis if he is going through a run of what he thinks is esophageal spasm. I do not want him to take it on a daily basis because his blood pressures can get fairly low. Angina currently well controlled. 2. GERD-omeprazole, no current issues 3. Hyperlipidemia-LDL 72, excellent, Crestor 40. Zetia 10No changes made in medications. 4. Palpitations-we spent the majority of today's visit discussing his palpitations. Sometimes this wakes him up from a daily nap. He describes compensatory pause. Very brief shortness of breath. PVCs. Prior Holter monitor reassuring. Continue with metoprolol. Now at 50 evening 25  AM 5. Hypertension-blood pressure well controlled Lisinopril is off. He seems to overall be doing much better. Occasional orthostatic like symptoms(had symptoms once when bending over to grab a golf ball have resolved). Continue to monitor.  We will continue with metoprolol for antianginal support. 6. Impaired glucose tolerance-hemoglobin A1C was 6.0. Discussed weight loss previously. 7. I will see him back in one year  Signed, Candee Furbish, MD [facility]  03/06/2016 9:13 AM

## 2016-04-01 ENCOUNTER — Other Ambulatory Visit: Payer: Self-pay | Admitting: Cardiology

## 2016-04-24 ENCOUNTER — Encounter (HOSPITAL_COMMUNITY): Payer: Self-pay | Admitting: *Deleted

## 2016-04-24 ENCOUNTER — Ambulatory Visit (HOSPITAL_COMMUNITY)
Admission: RE | Admit: 2016-04-24 | Discharge: 2016-04-24 | Disposition: A | Discharge status: Home / Self Care | Payer: BLUE CROSS/BLUE SHIELD | Source: Ambulatory Visit | Attending: Ophthalmology | Admitting: Ophthalmology

## 2016-04-24 ENCOUNTER — Ambulatory Visit: Payer: Self-pay | Admitting: Ophthalmology

## 2016-04-24 ENCOUNTER — Ambulatory Visit (HOSPITAL_COMMUNITY): Payer: BLUE CROSS/BLUE SHIELD | Admitting: Anesthesiology

## 2016-04-24 ENCOUNTER — Encounter (HOSPITAL_COMMUNITY)
Admission: RE | Disposition: A | Discharge status: Home / Self Care | Payer: Self-pay | Source: Ambulatory Visit | Attending: Ophthalmology

## 2016-04-24 DIAGNOSIS — I2510 Atherosclerotic heart disease of native coronary artery without angina pectoris: Secondary | ICD-10-CM | POA: Diagnosis not present

## 2016-04-24 DIAGNOSIS — E039 Hypothyroidism, unspecified: Secondary | ICD-10-CM | POA: Diagnosis not present

## 2016-04-24 DIAGNOSIS — E785 Hyperlipidemia, unspecified: Secondary | ICD-10-CM | POA: Insufficient documentation

## 2016-04-24 DIAGNOSIS — Z951 Presence of aortocoronary bypass graft: Secondary | ICD-10-CM | POA: Insufficient documentation

## 2016-04-24 DIAGNOSIS — K219 Gastro-esophageal reflux disease without esophagitis: Secondary | ICD-10-CM | POA: Insufficient documentation

## 2016-04-24 DIAGNOSIS — Z8546 Personal history of malignant neoplasm of prostate: Secondary | ICD-10-CM | POA: Diagnosis not present

## 2016-04-24 DIAGNOSIS — H33319 Horseshoe tear of retina without detachment, unspecified eye: Secondary | ICD-10-CM | POA: Insufficient documentation

## 2016-04-24 DIAGNOSIS — I10 Essential (primary) hypertension: Secondary | ICD-10-CM | POA: Insufficient documentation

## 2016-04-24 DIAGNOSIS — H4311 Vitreous hemorrhage, right eye: Secondary | ICD-10-CM | POA: Diagnosis not present

## 2016-04-24 HISTORY — PX: PARS PLANA VITRECTOMY: SHX2166

## 2016-04-24 HISTORY — DX: Gastro-esophageal reflux disease without esophagitis: K21.9

## 2016-04-24 HISTORY — PX: LASER PHOTO ABLATION: SHX5942

## 2016-04-24 HISTORY — PX: GAS INSERTION: SHX5336

## 2016-04-24 LAB — CBC
HCT: 44 % (ref 39.0–52.0)
Hemoglobin: 15.3 g/dL (ref 13.0–17.0)
MCH: 31.8 pg (ref 26.0–34.0)
MCHC: 34.8 g/dL (ref 30.0–36.0)
MCV: 91.5 fL (ref 78.0–100.0)
PLATELETS: 191 10*3/uL (ref 150–400)
RBC: 4.81 MIL/uL (ref 4.22–5.81)
RDW: 13.2 % (ref 11.5–15.5)
WBC: 8.6 10*3/uL (ref 4.0–10.5)

## 2016-04-24 LAB — BASIC METABOLIC PANEL
ANION GAP: 11 (ref 5–15)
BUN: 22 mg/dL — AB (ref 6–20)
CALCIUM: 9 mg/dL (ref 8.9–10.3)
CO2: 20 mmol/L — AB (ref 22–32)
CREATININE: 1.28 mg/dL — AB (ref 0.61–1.24)
Chloride: 106 mmol/L (ref 101–111)
GFR calc Af Amer: >60 mL/min (ref >60)
GFR, EST NON AFRICAN AMERICAN: 57 mL/min — AB (ref >60)
GLUCOSE: 91 mg/dL (ref 65–99)
Potassium: 3.4 mmol/L — ABNORMAL LOW (ref 3.5–5.1)
Sodium: 137 mmol/L (ref 135–145)

## 2016-04-24 SURGERY — PARS PLANA VITRECTOMY WITH 25 GAUGE
Anesthesia: Monitor Anesthesia Care | Site: Eye | Laterality: Right

## 2016-04-24 MED ORDER — OXYCODONE HCL 5 MG/5ML PO SOLN: 5.0000 mg | Freq: Once | ORAL | Status: DC | PRN

## 2016-04-24 MED ORDER — POLYMYXIN B SULFATE 500000 UNITS IJ SOLR
INTRAMUSCULAR | Status: AC
  Filled 2016-04-24: qty 500000

## 2016-04-24 MED ORDER — LIDOCAINE HCL 2 % IJ SOLN
INTRAMUSCULAR | Status: DC | PRN
  Administered 2016-04-24: 6 mL via RETROBULBAR

## 2016-04-24 MED ORDER — EPINEPHRINE PF 1 MG/ML IJ SOLN
INTRAMUSCULAR | Status: AC
Start: 2016-04-24 — End: ?
  Filled 2016-04-24: qty 1

## 2016-04-24 MED ORDER — GENTAMICIN SULFATE 40 MG/ML IJ SOLN
INTRAMUSCULAR | Status: AC
  Filled 2016-04-24: qty 2

## 2016-04-24 MED ORDER — SODIUM CHLORIDE 0.9 % IJ SOLN
INTRAMUSCULAR | Status: AC
  Filled 2016-04-24: qty 10

## 2016-04-24 MED ORDER — BSS IO SOLN
INTRAOCULAR | Status: AC
  Filled 2016-04-24: qty 15

## 2016-04-24 MED ORDER — DEXAMETHASONE SODIUM PHOSPHATE 10 MG/ML IJ SOLN
INTRAMUSCULAR | Status: DC | PRN
Start: 2016-04-24 — End: 2016-04-24
  Administered 2016-04-24: 10 mg

## 2016-04-24 MED ORDER — TETRACAINE HCL 0.5 % OP SOLN
OPHTHALMIC | Status: AC
  Filled 2016-04-24: qty 2

## 2016-04-24 MED ORDER — BUPIVACAINE HCL (PF) 0.75 % IJ SOLN
INTRAMUSCULAR | Status: AC
  Filled 2016-04-24: qty 10

## 2016-04-24 MED ORDER — TOBRAMYCIN-DEXAMETHASONE 0.3-0.1 % OP OINT
TOPICAL_OINTMENT | OPHTHALMIC | Status: DC | PRN
  Administered 2016-04-24: 1 via OPHTHALMIC

## 2016-04-24 MED ORDER — MIDAZOLAM HCL 2 MG/2ML IJ SOLN
INTRAMUSCULAR | Status: DC | PRN
  Administered 2016-04-24: 2 mg via INTRAVENOUS

## 2016-04-24 MED ORDER — SODIUM CHLORIDE 0.9 % IV SOLN
INTRAVENOUS | Status: DC
  Administered 2016-04-24 (×2): via INTRAVENOUS

## 2016-04-24 MED ORDER — FENTANYL CITRATE (PF) 100 MCG/2ML IJ SOLN: 25.0000 ug | INTRAMUSCULAR | Status: DC | PRN

## 2016-04-24 MED ORDER — HYPROMELLOSE (GONIOSCOPIC) 2.5 % OP SOLN
OPHTHALMIC | Status: AC
  Filled 2016-04-24: qty 15

## 2016-04-24 MED ORDER — ONDANSETRON HCL 4 MG/2ML IJ SOLN: 4.0000 mg | Freq: Four times a day (QID) | INTRAMUSCULAR | Status: DC | PRN

## 2016-04-24 MED ORDER — LIDOCAINE HCL 2 % IJ SOLN
INTRAMUSCULAR | Status: AC
  Filled 2016-04-24: qty 20

## 2016-04-24 MED ORDER — DEXAMETHASONE SODIUM PHOSPHATE 10 MG/ML IJ SOLN
INTRAMUSCULAR | Status: AC
  Filled 2016-04-24: qty 1

## 2016-04-24 MED ORDER — HYALURONIDASE HUMAN 150 UNIT/ML IJ SOLN
INTRAMUSCULAR | Status: AC
  Filled 2016-04-24: qty 1

## 2016-04-24 MED ORDER — PROPOFOL 10 MG/ML IV BOLUS
INTRAVENOUS | Status: DC | PRN
  Administered 2016-04-24: 50 mg via INTRAVENOUS

## 2016-04-24 MED ORDER — SODIUM CHLORIDE 0.9 % IV SOLN
INTRAVENOUS | Status: DC
  Filled 2016-04-24 (×2): qty 10

## 2016-04-24 MED ORDER — FENTANYL CITRATE (PF) 100 MCG/2ML IJ SOLN
INTRAMUSCULAR | Status: DC | PRN
  Administered 2016-04-24: 50 ug via INTRAVENOUS
  Administered 2016-04-24: 100 ug via INTRAVENOUS
  Administered 2016-04-24: 25 ug via INTRAVENOUS

## 2016-04-24 MED ORDER — ATROPINE SULFATE 1 % OP SOLN
OPHTHALMIC | Status: AC
  Filled 2016-04-24: qty 5

## 2016-04-24 MED ORDER — BSS PLUS IO SOLN
INTRAOCULAR | Status: AC
  Filled 2016-04-24: qty 500

## 2016-04-24 MED ORDER — EPINEPHRINE PF 1 MG/ML IJ SOLN
INTRAMUSCULAR | Status: DC | PRN
Start: 2016-04-24 — End: 2016-04-24
  Administered 2016-04-24: 500 mL

## 2016-04-24 MED ORDER — TOBRAMYCIN-DEXAMETHASONE 0.3-0.1 % OP OINT
TOPICAL_OINTMENT | OPHTHALMIC | Status: AC
  Filled 2016-04-24: qty 3.5

## 2016-04-24 MED ORDER — SODIUM HYALURONATE 10 MG/ML IO SOLN
INTRAOCULAR | Status: AC
  Filled 2016-04-24: qty 0.85

## 2016-04-24 MED ORDER — BSS IO SOLN
INTRAOCULAR | Status: DC | PRN
Start: 2016-04-24 — End: 2016-04-24
  Administered 2016-04-24: 15 mL

## 2016-04-24 MED ORDER — HYPROMELLOSE (GONIOSCOPIC) 2.5 % OP SOLN
OPHTHALMIC | Status: DC | PRN
  Administered 2016-04-24: 1 [drp] via OPHTHALMIC

## 2016-04-24 MED ORDER — NA CHONDROIT SULF-NA HYALURON 40-30 MG/ML IO SOLN
INTRAOCULAR | Status: AC
  Filled 2016-04-24: qty 0.5

## 2016-04-24 MED ORDER — OXYCODONE HCL 5 MG PO TABS: 5.0000 mg | ORAL_TABLET | Freq: Once | ORAL | Status: DC | PRN

## 2016-04-24 SURGICAL SUPPLY — 60 items
APPLICATOR COTTON TIP 6IN STRL (MISCELLANEOUS) ×2 IMPLANT
BLADE MVR KNIFE 20G (BLADE) IMPLANT
CANNULA ANT CHAM MAIN (OPHTHALMIC RELATED) IMPLANT
CANNULA DUAL BORE 23G (CANNULA) IMPLANT
CANNULA DUALBORE 25G (CANNULA) IMPLANT
CANNULA VLV SOFT TIP 25GA (OPHTHALMIC) ×4 IMPLANT
CAUTERY EYE LOW TEMP 1300F FIN (OPHTHALMIC RELATED) IMPLANT
CLSR STERI-STRIP ANTIMIC 1/2X4 (GAUZE/BANDAGES/DRESSINGS) ×2 IMPLANT
CORDS BIPOLAR (ELECTRODE) IMPLANT
COVER MAYO STAND STRL (DRAPES) IMPLANT
DRAPE HALF SHEET 40X57 (DRAPES) IMPLANT
DRAPE INCISE 51X51 W/FILM STRL (DRAPES) ×2 IMPLANT
DRAPE RETRACTOR (MISCELLANEOUS) ×2 IMPLANT
ERASER HMR WETFIELD 23G BP (MISCELLANEOUS) IMPLANT
FORCEPS ECKARDT ILM 25G SERR (OPHTHALMIC RELATED) IMPLANT
FORCEPS GRIESHABER ILM 25G A (INSTRUMENTS) IMPLANT
GAS AUTO FILL CONSTEL (OPHTHALMIC) ×2
GAS AUTO FILL CONSTELLATION (OPHTHALMIC) ×1 IMPLANT
GAS OPHTHALMIC (MISCELLANEOUS) ×2 IMPLANT
GLOVE BIOGEL PI IND STRL 6.5 (GLOVE) ×1 IMPLANT
GLOVE BIOGEL PI IND STRL 7.5 (GLOVE) ×1 IMPLANT
GLOVE BIOGEL PI INDICATOR 6.5 (GLOVE) ×1
GLOVE BIOGEL PI INDICATOR 7.5 (GLOVE) ×1
GLOVE SURG SS PI 6.5 STRL IVOR (GLOVE) ×2 IMPLANT
GOWN STRL REUS W/ TWL LRG LVL3 (GOWN DISPOSABLE) ×1 IMPLANT
GOWN STRL REUS W/TWL LRG LVL3 (GOWN DISPOSABLE) ×1
HANDLE PNEUMATIC FOR CONSTEL (OPHTHALMIC) IMPLANT
KIT BASIN OR (CUSTOM PROCEDURE TRAY) ×2 IMPLANT
KIT ROOM TURNOVER OR (KITS) ×2 IMPLANT
LENS BIOM SUPER VIEW SET DISP (OPHTHALMIC RELATED) ×4 IMPLANT
MICROPICK 25G (MISCELLANEOUS)
NEEDLE 18GX1X1/2 (RX/OR ONLY) (NEEDLE) ×2 IMPLANT
NEEDLE 25GX 5/8IN NON SAFETY (NEEDLE) ×2 IMPLANT
NEEDLE FILTER BLUNT 18X 1/2SAF (NEEDLE) ×1
NEEDLE FILTER BLUNT 18X1 1/2 (NEEDLE) ×1 IMPLANT
NEEDLE HYPO 25GX1X1/2 BEV (NEEDLE) IMPLANT
NEEDLE HYPO 30X.5 LL (NEEDLE) ×4 IMPLANT
NEEDLE RETROBULBAR 25GX1.5 (NEEDLE) ×2 IMPLANT
NS IRRIG 1000ML POUR BTL (IV SOLUTION) ×2 IMPLANT
PACK VITRECTOMY CUSTOM (CUSTOM PROCEDURE TRAY) ×2 IMPLANT
PAD ARMBOARD 7.5X6 YLW CONV (MISCELLANEOUS) ×2 IMPLANT
PAK PIK VITRECTOMY CVS 25GA (OPHTHALMIC) ×2 IMPLANT
PENCIL BIPOLAR 25GA STR DISP (OPHTHALMIC RELATED) IMPLANT
PICK MICROPICK 25G (MISCELLANEOUS) IMPLANT
PROBE LASER ILLUM FLEX CVD 25G (OPHTHALMIC) ×2 IMPLANT
ROLLS DENTAL (MISCELLANEOUS) IMPLANT
SCRAPER DIAMOND 25GA (OPHTHALMIC RELATED) IMPLANT
SOLUTION ANTI FOG 6CC (MISCELLANEOUS) ×2 IMPLANT
STOPCOCK 4 WAY LG BORE MALE ST (IV SETS) IMPLANT
SUT VICRYL 7 0 TG140 8 (SUTURE) ×2 IMPLANT
SUT VICRYL 8 0 TG140 8 (SUTURE) ×2 IMPLANT
SYR 10ML LL (SYRINGE) IMPLANT
SYR 20CC LL (SYRINGE) ×2 IMPLANT
SYR 5ML LL (SYRINGE) ×2 IMPLANT
SYR TB 1ML LUER SLIP (SYRINGE) ×2 IMPLANT
TAPE SURG TRANSPORE 1 IN (GAUZE/BANDAGES/DRESSINGS) ×1 IMPLANT
TAPE SURGICAL TRANSPORE 1 IN (GAUZE/BANDAGES/DRESSINGS) ×1
TOWEL OR 17X24 6PK STRL BLUE (TOWEL DISPOSABLE) ×4 IMPLANT
WATER STERILE IRR 1000ML POUR (IV SOLUTION) ×2 IMPLANT
WIPE INSTRUMENT VISIWIPE 73X73 (MISCELLANEOUS) IMPLANT

## 2016-04-24 NOTE — Discharge Instructions (Signed)
Keep face down during day.  Sleep on side - nose to pillow.  DO NOT SLEEP ON BACK.

## 2016-04-24 NOTE — Brief Op Note (Signed)
04/24/2016  6:35 PM  PATIENT:  Collin Morse  66 y.o. male  PRE-OPERATIVE DIAGNOSIS:  Retinal Tear with localized detachment and vitreous hemorrhage Right Eye   POST-OPERATIVE DIAGNOSIS:  Retinal tear with localized detachment and vitreous hemorrhage right eye  PROCEDURE:  Procedure(s): PARS PLANA VITRECTOMY WITH 25 GAUGE (Right) LASER PHOTO ABLATION-ENDOLASER (Right) INSERTION OF GAS- SF6 (Right)  SURGEON:  Surgeon(s) and Role:    * Jalene Mullet, MD - Primary  PHYSICIAN ASSISTANT:   ASSISTANTS: none   ANESTHESIA:   local and regional  EBL:  No intake/output data recorded.  BLOOD ADMINISTERED:none  DRAINS: none   LOCAL MEDICATIONS USED:  MARCAINE    and LIDOCAINE   SPECIMEN:  No Specimen  DISPOSITION OF SPECIMEN:  N/A  COUNTS:  YES  TOURNIQUET:  * No tourniquets in log *  DICTATION: .Note written in EPIC  PLAN OF CARE: Discharge to home after PACU  PATIENT DISPOSITION:  PACU - hemodynamically stable.   Delay start of Pharmacological VTE agent (>24hrs) due to surgical blood loss or risk of bleeding: not applicable

## 2016-04-24 NOTE — Transfer of Care (Signed)
Immediate Anesthesia Transfer of Care Note  Patient: Collin Morse  Procedure(s) Performed: Procedure(s): PARS PLANA VITRECTOMY WITH 25 GAUGE (Right) LASER PHOTO ABLATION-ENDOLASER (Right) INSERTION OF GAS- SF6 (Right)  Patient Location: PACU  Anesthesia Type:MAC  Level of Consciousness: awake, alert  and oriented  Airway & Oxygen Therapy: Patient Spontanous Breathing  Post-op Assessment: Report given to RN, Post -op Vital signs reviewed and stable and Patient moving all extremities  Post vital signs: Reviewed and stable  Last Vitals:  Vitals:   04/24/16 1556 04/24/16 1845  BP: (!) 149/70   Pulse: 70   Resp: 16   Temp: 36.9 C (P) 36.7 C    Last Pain:  Vitals:   04/24/16 1556  TempSrc: Oral      Patients Stated Pain Goal: 6 (32/99/24 2683)  Complications: No apparent anesthesia complications

## 2016-04-24 NOTE — H&P (Signed)
Date of examination:  04/26/16  Indication for surgery: retinal tear with localized detachment and vitreous hemorrhage right eye  Pertinent past medical history:  Past Medical History:  Diagnosis Date  . Coronary artery disease 2011  . Coronary atherosclerosis of native coronary artery 12/18/2012   CABG LIMA to LAD, SVG to circumflex 01/2009  . Dizziness and giddiness   . GERD (gastroesophageal reflux disease)   . Hernia   . HTN (hypertension) since 2008  . Hyperlipemia   . Hypothyroidism   . Prostate cancer [facility])     Pertinent ocular history:  Myopia and vitreous hemorrhage  Pertinent family history:  Family History  Problem Relation Age of Onset  . Dementia Mother   . High blood pressure Mother   . Lung cancer Mother   . Diabetes type II Mother   . Heart Problems Father   . Diabetes type II Father     General:  Healthy appearing patient in no distress.     Eyes:    Acuity OD 20/60  OS 20/25  cc  External: Within normal limits      Anterior segment: Within normal limits      Motility:   normal   Fundus: obscured view OD      Impression: retinal tear with localized detachment and vitreous hemorrhage right eye  Plan: Pars plana vitrectomy with endolaser and gas tamponade  Royston Cowper

## 2016-04-24 NOTE — Anesthesia Preprocedure Evaluation (Addendum)
Anesthesia Evaluation  Patient identified by MRN, date of birth, ID band Patient awake    Reviewed: Allergy & Precautions, H&P , NPO status , Patient's Chart, lab work & pertinent test results  Airway Mallampati: II   Neck ROM: full    Dental  (+) Teeth Intact   Pulmonary neg pulmonary ROS,    breath sounds clear to auscultation       Cardiovascular hypertension, + CAD and + CABG   Rhythm:regular Rate:Normal  Stress test (2016): low risk   Neuro/Psych    GI/Hepatic GERD  ,  Endo/Other  Hypothyroidism   Renal/GU      Musculoskeletal   Abdominal   Peds  Hematology   Anesthesia Other Findings   Reproductive/Obstetrics                            Anesthesia Physical Anesthesia Plan  ASA: III  Anesthesia Plan: MAC   Post-op Pain Management:    Induction:   Airway Management Planned: Nasal Cannula  Additional Equipment:   Intra-op Plan:   Post-operative Plan:   Informed Consent: I have reviewed the patients History and Physical, chart, labs and discussed the procedure including the risks, benefits and alternatives for the proposed anesthesia with the patient or authorized representative who has indicated his/her understanding and acceptance.     Plan Discussed with: CRNA, Surgeon and Anesthesiologist  Anesthesia Plan Comments:        Anesthesia Quick Evaluation

## 2016-04-24 NOTE — Op Note (Signed)
Collin Morse 04/24/2016 Diagnosis: Retinal tear with localized detachment and vitreous hemorrhage right eye  Procedure: Pars Plana Vitrectomy, Endolaser, Fluid Gas Exchange and 12%SF6 gas Operative Eye:  right eye  Surgeon: Royston Cowper Estimated Blood Loss: minimal Specimens for Pathology:  None Complications: none   The  patient was prepped and draped in the usual fashion for ocular surgery on the  right eye .  A lid speculum was placed.  Infusion line and trocar was placed at the 8 o'clock position approximately 3.5 mm from the surgical limbus.   The infusion line was allowed to run and then clamped when placed at the cannula opening. The line was inserted and secured to the drape with an adhesive strip.   Active trocars/cannula were placed at the 10 and 2 o'clock positions approximately 3.5 mm from the surgical limbus. The cannula was visualized in the vitreous cavity.  The light pipe and vitreous cutter were inserted into the vitreous cavity and a core vitrectomy was performed.  Care taken to remove the vitreous up to the vitreous base for 360 degrees.  Care was taken to carefully remove vitreous from the superior area of detachment.  A complete air fluid exchange was performed.  3 rows of endolaser were applied 360 degrees to the periphery and surrounding the superior break.  The superior cannulas were sequentially removed with concommitant tamponade using a cotton tipped applicator and noted to be air tight.  The infusion line and trocar were removed and the sclerotomy was closed with 8-0 Vicryl suture.  Subconjunctival injections of  Dexamethasone 4mg /12ml was placed in the infero-medial quadrant.   The speculum and drapes were removed and the eye was patched with Polymixin/Bacitracin ophthalmic ointment. An eye shield was placed and the patient was transferred alert and conversant with stable vital signs to the post operative recovery area.  The patient tolerated the procedure  well and no complications were noted.  Royston Cowper MD

## 2016-04-25 ENCOUNTER — Encounter (HOSPITAL_COMMUNITY): Payer: Self-pay | Admitting: Ophthalmology

## 2016-04-25 NOTE — Anesthesia Postprocedure Evaluation (Signed)
Anesthesia Post Note  Patient: Collin Morse  Procedure(s) Performed: Procedure(s) (LRB): PARS PLANA VITRECTOMY WITH 25 GAUGE (Right) LASER PHOTO ABLATION-ENDOLASER (Right) INSERTION OF GAS- SF6 (Right)  Patient location during evaluation: PACU Anesthesia Type: MAC Level of consciousness: awake and alert Pain management: pain level controlled Vital Signs Assessment: post-procedure vital signs reviewed and stable Respiratory status: spontaneous breathing, nonlabored ventilation and respiratory function stable Cardiovascular status: stable and blood pressure returned to baseline Anesthetic complications: no       Last Vitals:  Vitals:   04/24/16 1915 04/24/16 1925  BP:  (!) 118/54  Pulse: (!) 55 (!) 56  Resp: 16 18  Temp:      Last Pain:  Vitals:   04/24/16 1845  TempSrc:   PainSc: 0-No pain                 Madolyn Ackroyd,W. EDMOND

## 2016-05-03 ENCOUNTER — Other Ambulatory Visit: Payer: Self-pay | Admitting: Cardiology

## 2016-05-03 MED ORDER — CLOPIDOGREL BISULFATE 75 MG PO TABS: 75.0000 mg | ORAL_TABLET | Freq: Every day | ORAL | 0 refills | Status: DC

## 2016-06-04 ENCOUNTER — Other Ambulatory Visit: Payer: Self-pay | Admitting: Cardiology

## 2016-07-27 ENCOUNTER — Other Ambulatory Visit: Payer: Self-pay | Admitting: Cardiology

## 2016-11-10 ENCOUNTER — Ambulatory Visit (INDEPENDENT_AMBULATORY_CARE_PROVIDER_SITE_OTHER): Payer: BLUE CROSS/BLUE SHIELD | Admitting: Internal Medicine

## 2016-11-10 ENCOUNTER — Encounter: Payer: Self-pay | Admitting: Internal Medicine

## 2016-11-10 VITALS — BP 136/78 | HR 66 | Ht 73.0 in | Wt 199.6 lb

## 2016-11-10 DIAGNOSIS — I209 Angina pectoris, unspecified: Secondary | ICD-10-CM

## 2016-11-10 DIAGNOSIS — Z23 Encounter for immunization: Secondary | ICD-10-CM

## 2016-11-10 DIAGNOSIS — R05 Cough: Secondary | ICD-10-CM

## 2016-11-10 DIAGNOSIS — R058 Other specified cough: Secondary | ICD-10-CM

## 2016-11-10 MED ORDER — PREDNISONE 10 MG PO TABS: ORAL_TABLET | ORAL | 0 refills | Status: DC

## 2016-11-10 MED ORDER — AMOXICILLIN-POT CLAVULANATE 875-125 MG PO TABS: 1.0000 | ORAL_TABLET | Freq: Two times a day (BID) | ORAL | 0 refills | Status: AC

## 2016-11-10 NOTE — Patient Instructions (Addendum)
Augmentin 875 mg take one pill twice daily  X 10 days - take at breakfast and supper with large glass of water.  It would help reduce the usual side effects (diarrhea and yeast infections) if you ate cultured yogurt at lunch.   Prednisone 10 mg take  4 each am x 2 days,   2 each am x 2 days,  1 each am x 2 days and stop   Change prilosec 40 mg Take 30- 60 min before your first and last meals of the day until cough is gone for at least a week  GERD (REFLUX)  is an extremely common cause of respiratory symptoms just like yours , many times with no obvious heartburn at all.    It can be treated with medication, but also with lifestyle changes including elevation of the head of your bed (ideally with 6 inch  bed blocks),  Smoking cessation, avoidance of late meals, excessive alcohol, and avoid fatty foods, chocolate, peppermint, colas, red wine, and acidic juices such as orange juice.  NO MINT OR MENTHOL PRODUCTS SO NO COUGH DROPS  USE SUGARLESS CANDY INSTEAD (Jolley ranchers or Stover's or Life Savers) or even ice chips will also do - the key is to swallow to prevent all throat clearing. NO OIL BASED VITAMINS - use powdered substitutes.   If not all better by the end of the augmentin please call for sinus CT - call [facility] at [phone] to schedule if needed

## 2016-11-10 NOTE — Progress Notes (Signed)
Subjective:     Patient ID: Collin Morse, male   DOB: 06/28/1950,   MRN: [id number]  HPI  73 yowm never smoker with onset mid 1980s sneezing/ itchy eyes > eval by Dr Collin Morse > shots helped  Some  But best shot was "low dose allergen twice yearly" but never had cough until late September 2018  While  helping with demo on [facility] > severe cough > rx by Collin Morse with abx for dark mucus turned lighter but cough persisted so referred to pulmonary clinic 11/10/2016 by Collin Morse p additional course of cipro which also helped some.   11/10/2016 1st [facility] office visit/ Collin Morse   Chief Complaint  Patient presents with  . Pulmonary Consult    Referred by Collin Primas, NP.  He pt c/o cough for the past month. Cough seems worse when he lies down and after eating. He has noticed that his breathing has been bothering him some with exertion.    onset was fairly abrupt assoc with nasal congestion and quite a bit of purulent nasal discharge that improved but never completely cleared p 2 rounds of abx - takes ppi prn s overt HB   Collin Reflux v Neurogenic Cough Differentiator Reflux Comments  Do you awaken from a sound sleep coughing violently?                            With trouble breathing? No no   Do you have choking episodes when you cannot  Get enough air, gasping for air ?              No    Do you usually cough when you lie down into  The bed, or when you just lie down to rest ?                           immediately when head hits pillow x 30 min then fine > clear   Do you usually cough after meals or eating?         Yes esp supper   Do you cough when (or after) you bend over?    some   GERD SCORE     Collin Reflux v Neurogenic Cough Differentiator Neurogenic   Do you more-or-less cough all day long? Sporadic    Does change of temperature make you cough? some   Does laughing or chuckling cause you to cough? No    Do fumes (perfume, automobile fumes, burned  Toast,  etc.,) cause you to cough ?      no   Does speaking, singing, or talking on the phone cause you to cough   ?               Some    Neurogenic/Airway score      Really Not limited by breathing from desired activities unless coughing    No other obvious day to day or daytime variability or assoc   mucus plugs or hemoptysis or cp or chest tightness, subjective wheeze  No unusual exp hx or h/o childhood pna/ asthma or knowledge of premature birth.  Sleeping ok flat p first 30 min coughing  without nocturnal  or early am exacerbation  of respiratory  c/o's or need for noct saba. Also denies any obvious fluctuation of symptoms with weather or environmental changes or other aggravating or alleviating factors except as outlined above  Current Allergies, Complete Past Medical History, Past Surgical History, Family History, and Social History were reviewed in [employer] record.  ROS  The following are not active complaints unless bolded Hoarseness, sore throat, dysphagia, dental problems, itching, sneezing,  nasal congestion or discharge of excess mucus or purulent secretions, ear ache,   fever, chills, sweats, unintended wt loss or wt gain, classically pleuritic or exertional cp,  orthopnea pnd or leg swelling, presyncope, palpitations, abdominal pain, anorexia, nausea, vomiting, diarrhea  or change in bowel habits or change in bladder habits, change in stools or change in urine, dysuria, hematuria,  rash, arthralgias, visual complaints, headache, numbness, weakness or ataxia or problems with walking or coordination,  change in mood/affect or memory.        Current Meds  Medication Sig  . aspirin 81 MG tablet Take 81 mg by mouth 2 (two) times daily.   . cetirizine (ZYRTEC) 10 MG tablet Take 10 mg by mouth daily.  Marland Kitchen CIALIS 5 MG tablet Take 5 mg by mouth daily as needed for erectile dysfunction. As directed  . ezetimibe (ZETIA) 10 MG tablet TAKE 1 TABLET BY MOUTH EVERY DAY  .  ipratropium (ATROVENT) 0.03 % nasal spray Place 2 sprays into both nostrils every 12 (twelve) hours.  . metoprolol (LOPRESSOR) 50 MG tablet Take 1/2 tab (25 mg) by mouth in the AM & Take 1 whole tab (50 mg) by mouth in the PM  . Multiple Vitamins-Minerals (MULTIVITAMIN PO) Take by mouth daily.  . nitroGLYCERIN (NITROSTAT) 0.4 MG SL tablet Place 1 tablet (0.4 mg total) under the tongue every 5 (five) minutes as needed for chest pain.  Marland Kitchen omeprazole (PRILOSEC) 40 MG capsule Take 40 mg by mouth daily.  . rosuvastatin (CRESTOR) 40 MG tablet TAKE 1 TABLET BY MOUTH EVERY DAY         Review of Systems     Objective:   Physical Exam amb wm with slt nasal tone to voice  Wt Readings from Last 3 Encounters:  11/10/16 199 lb 9.6 oz (90.5 kg)  04/24/16 210 lb (95.3 kg)  03/06/16 215 lb 6.4 oz (97.7 kg)    Vital signs reviewed   HEENT: nl dentition,   and oropharynx. Nl external ear canals without cough reflex - moderate bilateral non-specific turbinate edema     NECK :  without JVD/Nodes/TM/ nl carotid upstrokes bilaterally   LUNGS: no acc muscle use,  Nl contour chest which is clear to A and P bilaterally without cough on insp or exp maneuvers   CV:  RRR  no s3 or murmur or increase in P2, and no edema   ABD:  soft and nontender with nl inspiratory excursion in the supine position. No bruits or organomegaly appreciated, bowel sounds nl  MS:  Nl gait/ ext warm without deformities, calf tenderness, cyanosis or clubbing No obvious joint restrictions   SKIN: warm and dry without lesions    NEURO:  alert, approp, nl sensorium with  no motor or cerebellar deficits apparent.     cxr per Collin Morse wnl in last month per pt so not repeated     Assessment:

## 2016-11-11 NOTE — Assessment & Plan Note (Addendum)
Onset Sept 2018 improved with 2 rounds of abx > 11/10/2016  rec augmentin x10 d and ppi and then sinus ct if not improved    Comment: The most common causes of chronic cough in immunocompetent adults include the following: upper airway cough syndrome (UACS), previously referred to as postnasal drip syndrome (PNDS), which is caused by variety of rhinosinus conditions; (2) asthma; (3) GERD; (4) chronic bronchitis from cigarette smoking or other inhaled environmental irritants; (5) nonasthmatic eosinophilic bronchitis; and (6) bronchiectasis.   These conditions, singly or in combination, have accounted for up to 94% of the causes of chronic cough in prospective studies.   Other conditions have constituted no >6% of the causes in prospective studies These have included bronchogenic carcinoma, chronic interstitial pneumonia, sarcoidosis, left ventricular failure, ACEI-induced cough, and aspiration from a condition associated with pharyngeal dysfunction.    Chronic cough is often simultaneously caused by more than one condition. A single cause has been found from 38 to 82% of the time, multiple causes from 18 to 62%. Multiply caused cough has been the result of three diseases up to 42% of the time.      Of the three most common causes of  Sub-acute or chronic cough, only one (GERD)  can actually contribute to/ trigger  the other two (asthma and post nasal drip syndrome)  and perpetuate the cylce of cough.  While not intuitively obvious, many patients with chronic low grade reflux do not cough until there is a primary insult that disturbs the protective epithelial barrier and exposes sensitive nerve endings.   This is typically viral but can result of partially treated sinusitis, which I strongly suspect here.   The point is that once this occurs, it is difficult to eliminate the cycle  using anything but a maximally effective acid suppression regimen at least in the short run, accompanied by an appropriate  diet to address non acid GERD and  eliminate the sinusitis if possible  rec augmentin x 10 days/ ppi bid ac plus diet then f/u with sinus ct if not 100% back to baseline  Total time devoted to counseling  > 50 % of initial 60 min office visit:  review case with pt/ discussion of options/alternatives/ personally creating written customized instructions  in presence of pt  then going over those specific  Instructions directly with the pt including how to use all of the meds but in particular covering each new medication in detail and the difference between the maintenance= "automatic" meds and the prns using an action plan format for the latter (If this problem/symptom => do that organization reading Left to right).  Please see AVS from this visit for a full list of these instructions which I personally wrote for this pt and  are unique to this visit.

## 2016-11-20 ENCOUNTER — Institutional Professional Consult (permissible substitution): Payer: Medicare Other | Admitting: Pulmonary Disease

## 2016-12-19 ENCOUNTER — Other Ambulatory Visit: Payer: Self-pay | Admitting: Cardiology

## 2017-01-22 DIAGNOSIS — M542 Cervicalgia: Secondary | ICD-10-CM | POA: Diagnosis not present

## 2017-02-05 DIAGNOSIS — M542 Cervicalgia: Secondary | ICD-10-CM | POA: Diagnosis not present

## 2017-02-12 ENCOUNTER — Other Ambulatory Visit: Payer: Self-pay | Admitting: Cardiology

## 2017-02-12 MED ORDER — ROSUVASTATIN CALCIUM 40 MG PO TABS: 40.0000 mg | ORAL_TABLET | Freq: Every day | ORAL | 0 refills | Status: DC

## 2017-03-06 ENCOUNTER — Encounter (INDEPENDENT_AMBULATORY_CARE_PROVIDER_SITE_OTHER): Payer: Self-pay

## 2017-03-06 ENCOUNTER — Encounter: Payer: Self-pay | Admitting: Cardiology

## 2017-03-06 ENCOUNTER — Ambulatory Visit: Payer: 59 | Admitting: Cardiology

## 2017-03-06 VITALS — BP 124/74 | HR 55

## 2017-03-06 DIAGNOSIS — I2510 Atherosclerotic heart disease of native coronary artery without angina pectoris: Secondary | ICD-10-CM

## 2017-03-06 DIAGNOSIS — I10 Essential (primary) hypertension: Secondary | ICD-10-CM | POA: Diagnosis not present

## 2017-03-06 DIAGNOSIS — I209 Angina pectoris, unspecified: Secondary | ICD-10-CM

## 2017-03-06 MED ORDER — NITROGLYCERIN 0.4 MG SL SUBL: 0.4000 mg | SUBLINGUAL_TABLET | SUBLINGUAL | 11 refills | Status: DC | PRN

## 2017-03-06 MED ORDER — EZETIMIBE 10 MG PO TABS: 10.0000 mg | ORAL_TABLET | Freq: Every day | ORAL | 3 refills | Status: DC

## 2017-03-06 NOTE — Patient Instructions (Signed)
Medication Instructions:  Please decrease ASA to 81 mg a day. Continue all other medications as listed.  Follow-Up: Follow up in 1 year with Dr. Marlou Porch.  You will receive a letter in the mail 2 months before you are due.  Please call us when you receive this letter to schedule your follow up appointment.  If you need a refill on your cardiac medications before your next appointment, please call your pharmacy.  Thank you for choosing [facility]!!

## 2017-03-06 NOTE — Progress Notes (Signed)
[name]. [address], [place]  [address] Phone: [phone] Fax:  [phone]  Date:  03/06/2017   ID:  Collin, Morse 1950/09/14, MRN [id number]  PCP:  Marda Stalker, PA-C    History of Present Illness: Collin Morse is a 67 y.o. male with prior two-vessel bypass, LIMA to LAD, SVG to circumflex 01/2009, hypertension, hyperlipidemia here for follow-up of symptoms of throat tightness, palpitations . Previously on 08/05/15 with palpitations, feeling somewhat lightheaded with some tightness in his throat and neck. Interestingly, he says this is somewhat of a seasonal event in the summertime when the weather is hot. Perhaps the addition of amlodipine and changing of the metoprolol has helped.  He split metoprolol at the advice of Pam - 50mg  PM, 25mg  AM.  Decreased tea intake. Better. Night triggers, laying down for instance.   Previously diagnosed with esophageal spasms. His symptoms previously were contained after proton pump inhibitor.   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   Nuclear stress test October of 2013 following bypass was low risk.  Subsequent nuclear stress test in July 2016 was also low risk.  He takes Plavix when he needs to stop his aspirin for allergy shots on occasion, only for 30 days. He does not take Plavix on a daily basis.  03/06/17 - Retinal surgery, 3 small tears. Bottom part of eye went dark. Right eye floaters went away. No chest pain. Occasional esophageal discomfort. In [place] over [place] Year, pain neck, left shoulder. Slept sitting up did fine. [place]. Occasional skips, palpitations especially with wine, caffeine. Carries NTG with him, refreshes yearly. Glucose mildly elevated (a1c 6). Levetra ok.    Wt Readings from Last 3 Encounters:  11/10/16 199 lb 9.6 oz (90.5 kg)  04/24/16 210 lb (95.3 kg)  03/06/16 215 lb 6.4 oz (97.7 kg)     Past Medical  History:  Diagnosis Date  . Coronary artery disease 2011  . Coronary atherosclerosis of native coronary artery 12/18/2012   CABG LIMA to LAD, SVG to circumflex 01/2009  . Dizziness and giddiness   . GERD (gastroesophageal reflux disease)   . Hernia   . HTN (hypertension) since 2008  . Hyperlipemia   . Hypothyroidism   . Prostate cancer [facility])     Past Surgical History:  Procedure Laterality Date  . CATARACT EXTRACTION     Bilateral  . CORONARY ARTERY BYPASS GRAFT  2011  . GAS INSERTION Right 04/24/2016   Procedure: INSERTION OF GAS- SF6;  Surgeon: Jalene Mullet, MD;  Location: [facility];  Service: Ophthalmology;  Laterality: Right;  . LASER PHOTO ABLATION Right 04/24/2016   Procedure: LASER PHOTO ABLATION-ENDOLASER;  Surgeon: Jalene Mullet, MD;  Location: [facility];  Service: Ophthalmology;  Laterality: Right;  . PARS PLANA VITRECTOMY Right 04/24/2016   Procedure: PARS PLANA VITRECTOMY WITH 25 GAUGE;  Surgeon: Jalene Mullet, MD;  Location: [facility];  Service: Ophthalmology;  Laterality: Right;  . PROSTATE SURGERY  2006    Current Outpatient Medications  Medication Sig Dispense Refill  . aspirin 81 MG tablet Take 81 mg by mouth daily.    . clopidogrel (PLAVIX) 75 MG tablet Take 1 tablet (75 mg total) by mouth daily. Patient takes this medication in the place of Asprin when he gets his allergy shot in January. 30 tablet 0  . ezetimibe (ZETIA) 10 MG tablet Take 1 tablet (10  mg total) by mouth daily. Please make yearly appt with Dr. Marlou Porch for February before anymore refills. 1st attempt 90 tablet 0  . metoprolol (LOPRESSOR) 50 MG tablet Take 1/2 tab (25 mg) by mouth in the AM & Take 1 whole tab (50 mg) by mouth in the PM 135 tablet 3  . Multiple Vitamins-Minerals (MULTIVITAMIN PO) Take by mouth daily.    . nitroGLYCERIN (NITROSTAT) 0.4 MG SL tablet Place 1 tablet (0.4 mg total) under the tongue every 5 (five) minutes as needed for chest pain. 25 tablet 11  . omeprazole (PRILOSEC) 40 MG capsule  Take 40 mg by mouth daily.    . rosuvastatin (CRESTOR) 40 MG tablet Take 1 tablet (40 mg total) by mouth daily. Please keep upcoming appt for future refills. Thank you 90 tablet 0  . vardenafil (LEVITRA) 20 MG tablet Take 20 mg by mouth daily as needed for erectile dysfunction.     No current facility-administered medications for this visit.     Allergies:    Allergies  Allergen Reactions  . Sulfa Antibiotics Nausea And Vomiting    Makes sick   . Tetracyclines & Related Nausea And Vomiting    Makes sick    Social History:  The patient  reports that  has never smoked. he has never used smokeless tobacco. He reports that he drinks about 1.2 oz of alcohol per week. He reports that he does not use drugs.   ROS:  Please see the history of present illness.  Other ROS neg.   PHYSICAL EXAM: VS:  BP 124/74   Pulse (!) 55  GEN: Well nourished, well developed, in no acute distress  HEENT: normal  Neck: no JVD, carotid bruits, or masses Cardiac: RRR; no murmurs, rubs, or gallops,no edema  Respiratory:  clear to auscultation bilaterally, normal work of breathing GI: soft, nontender, nondistended, + BS MS: no deformity or atrophy  Skin: warm and dry, no rash Neuro:  Alert and Oriented x 3, Strength and sensation are intact Psych: euthymic mood, full affect   Nuclear stress test: 07/15/14-low risk, no ischemia, normal EF  Labs: LDL 89 - 12/08/16  EKG:  EKG today 03/06/17 - SB LAD. Personally viewed. 08/11/15-sinus bradycardia rate 55, left axis deviation, old inferior infarct pattern personally viewed-prior 07/15/13-sinus bradycardia 53, left axis deviation.  10/22/13-sinus bradycardia rate 57, left axis deviation, nonspecific ST-T wave changes  48 hour monitor prior - OK. No adverse arrhythmias  ASSESSMENT AND PLAN:  1. Coronary artery disease/angina/palpitation- after the heat of the summer, in [place], [place], he was having more palpitations in the evening hours that was worrisome to  him. Throat tightness as well which has been for many years. Discussed with Pam who suggested taking 50 mg at bedtime and 25 mg in the morning of metoprolol. Since he has made that change, he is feeling much better. He feels as though he is back on track. Stress test July 2016 low risk, no ischemia. Continue with current plan. Prior bypass. Enjoys playing golf. Continue to hydrate well.  I have given him amlodipine 5 mg on an as-needed basis if he is going through a run of what he thinks is esophageal spasm. I do not want him to take it on a daily basis because his blood pressures can get fairly low. Angina currently well controlled. Had one more esophageal episode since then. Stable,.  2. GERD-omeprazole, no current issues, No changes 3. Hyperlipidemia-LDL 89, excellent, Crestor 40. Zetia 10. No changes made in medications.  4. Palpitations-Sometimes this wakes him up from a daily nap. He describes compensatory pause. Very brief shortness of breath. PVCs. Prior Holter monitor reassuring. Continue with metoprolol. Now at 50 evening 25 AM. Asked him to to consider Apple watch.  5. Hypertension-blood pressure well controlled Lisinopril is off. He seems to overall be doing much better. Occasional orthostatic like symptoms(had symptoms once when bending over to grab a golf ball have resolved). Continue to monitor.  We will continue with metoprolol for antianginal support. No changes today. Controlled.  6. Impaired glucose tolerance-hemoglobin A1C was 6.0. Discussed weight loss again.  7. ED - ok with Levetra.  8. I will see him back in one year  Signed, Candee Furbish, MD [facility]  03/06/2017 8:50 AM

## 2017-03-06 NOTE — Addendum Note (Signed)
Addended by: Shellia Cleverly on: 03/06/2017 08:53 AM   Modules accepted: Orders

## 2017-03-15 DIAGNOSIS — M542 Cervicalgia: Secondary | ICD-10-CM | POA: Diagnosis not present

## 2017-03-16 DIAGNOSIS — J029 Acute pharyngitis, unspecified: Secondary | ICD-10-CM | POA: Diagnosis not present

## 2017-03-27 DIAGNOSIS — H26491 Other secondary cataract, right eye: Secondary | ICD-10-CM | POA: Diagnosis not present

## 2017-03-27 DIAGNOSIS — H35372 Puckering of macula, left eye: Secondary | ICD-10-CM | POA: Diagnosis not present

## 2017-03-27 DIAGNOSIS — H43821 Vitreomacular adhesion, right eye: Secondary | ICD-10-CM | POA: Diagnosis not present

## 2017-04-12 ENCOUNTER — Other Ambulatory Visit: Payer: Self-pay | Admitting: Cardiology

## 2017-05-08 DIAGNOSIS — M542 Cervicalgia: Secondary | ICD-10-CM | POA: Diagnosis not present

## 2017-05-18 ENCOUNTER — Other Ambulatory Visit: Payer: Self-pay | Admitting: Cardiology

## 2017-05-22 DIAGNOSIS — M542 Cervicalgia: Secondary | ICD-10-CM | POA: Diagnosis not present

## 2017-05-29 DIAGNOSIS — L089 Local infection of the skin and subcutaneous tissue, unspecified: Secondary | ICD-10-CM | POA: Diagnosis not present

## 2017-06-06 DIAGNOSIS — H35371 Puckering of macula, right eye: Secondary | ICD-10-CM | POA: Diagnosis not present

## 2017-06-06 DIAGNOSIS — H35372 Puckering of macula, left eye: Secondary | ICD-10-CM | POA: Diagnosis not present

## 2017-06-06 DIAGNOSIS — H43812 Vitreous degeneration, left eye: Secondary | ICD-10-CM | POA: Diagnosis not present

## 2017-06-15 ENCOUNTER — Ambulatory Visit: Payer: 59 | Admitting: Podiatry

## 2017-06-15 ENCOUNTER — Ambulatory Visit (INDEPENDENT_AMBULATORY_CARE_PROVIDER_SITE_OTHER): Payer: 59

## 2017-06-15 ENCOUNTER — Encounter: Payer: Self-pay | Admitting: Podiatry

## 2017-06-15 VITALS — BP 153/78 | HR 56 | Resp 16

## 2017-06-15 DIAGNOSIS — M2041 Other hammer toe(s) (acquired), right foot: Secondary | ICD-10-CM

## 2017-06-15 DIAGNOSIS — D169 Benign neoplasm of bone and articular cartilage, unspecified: Secondary | ICD-10-CM | POA: Diagnosis not present

## 2017-06-17 NOTE — Progress Notes (Signed)
Subjective:   Patient ID: Collin Morse, male   DOB: 67 y.o.   MRN: [id number]   HPI Patient presents stating he is having a lot of pain on the inside of the right fifth digit and its been relatively new and its duration.  Patient thinks he may have a bone spur formation and he had a previous trimming which was not effective.  Patient does not smoke likes to be active   Review of Systems  All other systems reviewed and are negative.       Objective:  Physical Exam  Constitutional: He appears well-developed and well-nourished.  Cardiovascular: Intact distal pulses.  Pulmonary/Chest: Effort normal.  Musculoskeletal: Normal range of motion.  Neurological: He is alert.  Skin: Skin is warm.  Nursing note and vitals reviewed.   Neurovascular status found to be intact muscle strength is adequate range of motion within normal limits with patient found to have distal medial keratotic lesion digit 5 right that is painful when pressed and make shoe gear difficult.  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  Discussed possibility for exostectomy which may be necessary depending on response to this conservative treatment  X-ray indicates severe rotation digit 5 right with probable osteochondral lesion

## 2017-06-18 DIAGNOSIS — M542 Cervicalgia: Secondary | ICD-10-CM | POA: Diagnosis not present

## 2017-07-03 DIAGNOSIS — M542 Cervicalgia: Secondary | ICD-10-CM | POA: Diagnosis not present

## 2017-07-23 DIAGNOSIS — M542 Cervicalgia: Secondary | ICD-10-CM | POA: Diagnosis not present

## 2017-07-23 DIAGNOSIS — H00015 Hordeolum externum left lower eyelid: Secondary | ICD-10-CM | POA: Diagnosis not present

## 2017-08-06 DIAGNOSIS — M542 Cervicalgia: Secondary | ICD-10-CM | POA: Diagnosis not present

## 2017-08-20 DIAGNOSIS — M542 Cervicalgia: Secondary | ICD-10-CM | POA: Diagnosis not present

## 2017-09-03 DIAGNOSIS — M542 Cervicalgia: Secondary | ICD-10-CM | POA: Diagnosis not present

## 2017-09-05 ENCOUNTER — Telehealth: Payer: Self-pay | Admitting: Cardiology

## 2017-09-05 MED ORDER — CLOPIDOGREL BISULFATE 75 MG PO TABS: 75.0000 mg | ORAL_TABLET | Freq: Every day | ORAL | 0 refills | Status: DC

## 2017-09-05 NOTE — Telephone Encounter (Signed)
Pt calling requesting a refill on clopidogrel, because he has to get his allergy shot next week and he has to go off of aspirin for 30 days, pt wants this prescription sent to [facility] on [address], pt can be reached at [phone], if has questions. Would Dr. Marlou Porch like to refill this medication/ please address

## 2017-09-05 NOTE — Telephone Encounter (Signed)
From 02/2017 office visit note by Dr Candee Furbish: He takes Plavix when he needs to stop his aspirin for allergy shots on occasion, only for 30 days. He does not take Plavix on a daily basis. OK to refill

## 2017-09-05 NOTE — Telephone Encounter (Signed)
Pt's medication was sent to pt's pharmacy as requested. Confirmation received.  °

## 2017-09-13 DIAGNOSIS — L299 Pruritus, unspecified: Secondary | ICD-10-CM | POA: Diagnosis not present

## 2017-09-13 DIAGNOSIS — J301 Allergic rhinitis due to pollen: Secondary | ICD-10-CM | POA: Diagnosis not present

## 2017-09-17 DIAGNOSIS — M542 Cervicalgia: Secondary | ICD-10-CM | POA: Diagnosis not present

## 2017-09-27 ENCOUNTER — Other Ambulatory Visit: Payer: Self-pay | Admitting: Cardiology

## 2017-10-02 DIAGNOSIS — M542 Cervicalgia: Secondary | ICD-10-CM | POA: Diagnosis not present

## 2017-10-25 DIAGNOSIS — M542 Cervicalgia: Secondary | ICD-10-CM | POA: Diagnosis not present

## 2017-11-13 DIAGNOSIS — M542 Cervicalgia: Secondary | ICD-10-CM | POA: Diagnosis not present

## 2017-12-04 DIAGNOSIS — M542 Cervicalgia: Secondary | ICD-10-CM | POA: Diagnosis not present

## 2017-12-19 ENCOUNTER — Other Ambulatory Visit: Payer: Self-pay | Admitting: Cardiology

## 2017-12-24 DIAGNOSIS — E785 Hyperlipidemia, unspecified: Secondary | ICD-10-CM | POA: Diagnosis not present

## 2017-12-24 DIAGNOSIS — C61 Malignant neoplasm of prostate: Secondary | ICD-10-CM | POA: Diagnosis not present

## 2017-12-24 DIAGNOSIS — R7303 Prediabetes: Secondary | ICD-10-CM | POA: Diagnosis not present

## 2017-12-26 DIAGNOSIS — M542 Cervicalgia: Secondary | ICD-10-CM | POA: Diagnosis not present

## 2017-12-27 DIAGNOSIS — R7303 Prediabetes: Secondary | ICD-10-CM | POA: Diagnosis not present

## 2017-12-27 DIAGNOSIS — Z23 Encounter for immunization: Secondary | ICD-10-CM | POA: Diagnosis not present

## 2017-12-27 DIAGNOSIS — Z0000 Encounter for general adult medical examination without abnormal findings: Secondary | ICD-10-CM | POA: Diagnosis not present

## 2017-12-27 DIAGNOSIS — I10 Essential (primary) hypertension: Secondary | ICD-10-CM | POA: Diagnosis not present

## 2017-12-27 DIAGNOSIS — E782 Mixed hyperlipidemia: Secondary | ICD-10-CM | POA: Diagnosis not present

## 2018-01-14 DIAGNOSIS — M542 Cervicalgia: Secondary | ICD-10-CM | POA: Diagnosis not present

## 2018-01-27 ENCOUNTER — Other Ambulatory Visit: Payer: Self-pay | Admitting: Cardiology

## 2018-02-01 DIAGNOSIS — R7303 Prediabetes: Secondary | ICD-10-CM | POA: Diagnosis not present

## 2018-02-01 DIAGNOSIS — E782 Mixed hyperlipidemia: Secondary | ICD-10-CM | POA: Diagnosis not present

## 2018-02-01 DIAGNOSIS — N289 Disorder of kidney and ureter, unspecified: Secondary | ICD-10-CM | POA: Diagnosis not present

## 2018-02-01 DIAGNOSIS — K219 Gastro-esophageal reflux disease without esophagitis: Secondary | ICD-10-CM | POA: Diagnosis not present

## 2018-02-01 DIAGNOSIS — Z0000 Encounter for general adult medical examination without abnormal findings: Secondary | ICD-10-CM | POA: Diagnosis not present

## 2018-02-01 DIAGNOSIS — Z23 Encounter for immunization: Secondary | ICD-10-CM | POA: Diagnosis not present

## 2018-02-01 DIAGNOSIS — I10 Essential (primary) hypertension: Secondary | ICD-10-CM | POA: Diagnosis not present

## 2018-02-11 DIAGNOSIS — M542 Cervicalgia: Secondary | ICD-10-CM | POA: Diagnosis not present

## 2018-02-21 ENCOUNTER — Encounter: Payer: Self-pay | Admitting: Cardiology

## 2018-02-25 DIAGNOSIS — M542 Cervicalgia: Secondary | ICD-10-CM | POA: Diagnosis not present

## 2018-03-11 ENCOUNTER — Ambulatory Visit (INDEPENDENT_AMBULATORY_CARE_PROVIDER_SITE_OTHER): Payer: Medicare Other | Admitting: Cardiology

## 2018-03-11 ENCOUNTER — Encounter: Payer: Self-pay | Admitting: Cardiology

## 2018-03-11 VITALS — BP 140/80 | HR 58 | Ht 73.0 in | Wt 225.8 lb

## 2018-03-11 DIAGNOSIS — I209 Angina pectoris, unspecified: Secondary | ICD-10-CM | POA: Diagnosis not present

## 2018-03-11 DIAGNOSIS — I10 Essential (primary) hypertension: Secondary | ICD-10-CM | POA: Diagnosis not present

## 2018-03-11 DIAGNOSIS — I2510 Atherosclerotic heart disease of native coronary artery without angina pectoris: Secondary | ICD-10-CM | POA: Diagnosis not present

## 2018-03-11 NOTE — Progress Notes (Signed)
[facility]. [address] [place], [place]  [address] Phone: [phone] Fax:  [phone]  Date:  03/11/2018   ID:  Petros, Ahart Aug 23, 1950, MRN [id number]  PCP:  Marda Stalker, PA-C    History of Present Illness: Collin Morse is a 68 y.o. male with prior two-vessel bypass, LIMA to LAD, SVG to circumflex 01/2009, hypertension, hyperlipidemia here for follow-up of symptoms of throat tightness, palpitations . Previously on 08/05/15 with palpitations, feeling somewhat lightheaded with some tightness in his throat and neck. Interestingly, he says this is somewhat of a seasonal event in the summertime when the weather is hot. Perhaps the addition of amlodipine and changing of the metoprolol has helped.  He split metoprolol at the advice of Pam - 50mg  PM, 25mg  AM.  Decreased tea intake. Better. Night triggers, laying down for instance.   Previously diagnosed with esophageal spasms. His symptoms previously were contained after proton pump inhibitor.   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   Nuclear stress test October of 2013 following bypass was low risk.  Subsequent nuclear stress test in July 2016 was also low risk.  He takes Plavix when he needs to stop his aspirin for allergy shots on occasion, only for 30 days. He does not take Plavix on a daily basis.  03/06/17 - Retinal surgery, 3 small tears. Bottom part of eye went dark. Right eye floaters went away. No chest pain. Occasional esophageal discomfort. In [place] over New Year, pain neck, left shoulder. Slept sitting up did fine. [place]. Occasional skips, palpitations especially with wine, caffeine. Carries NTG with him, refreshes yearly. Glucose mildly elevated (a1c 6). Levetra ok.   03/11/2018- overall been doing quite well.  Has gained a little bit of weight.  Blood pressure with Marda Stalker was excellent.  Considering retiring soon.   Doing well.  No fevers chills nausea vomiting syncope bleeding.   Wt Readings from Last 3 Encounters:  03/11/18 225 lb 12.8 oz (102.4 kg)  11/10/16 199 lb 9.6 oz (90.5 kg)  04/24/16 210 lb (95.3 kg)     Past Medical History:  Diagnosis Date  . Coronary artery disease 2011  . Coronary atherosclerosis of native coronary artery 12/18/2012   CABG LIMA to LAD, SVG to circumflex 01/2009  . Dizziness and giddiness   . GERD (gastroesophageal reflux disease)   . Hernia   . HTN (hypertension) since 2008  . Hyperlipemia   . Hypothyroidism   . Prostate cancer [facility])     Past Surgical History:  Procedure Laterality Date  . CATARACT EXTRACTION     Bilateral  . CORONARY ARTERY BYPASS GRAFT  2011  . GAS INSERTION Right 04/24/2016   Procedure: INSERTION OF GAS- SF6;  Surgeon: Jalene Mullet, MD;  Location: [place];  Service: Ophthalmology;  Laterality: Right;  . LASER PHOTO ABLATION Right 04/24/2016   Procedure: LASER PHOTO ABLATION-ENDOLASER;  Surgeon: Jalene Mullet, MD;  Location: [place];  Service: Ophthalmology;  Laterality: Right;  . PARS PLANA VITRECTOMY Right 04/24/2016   Procedure: PARS PLANA VITRECTOMY WITH 25 GAUGE;  Surgeon: Jalene Mullet, MD;  Location: [place];  Service: Ophthalmology;  Laterality: Right;  . PROSTATE SURGERY  2006    Current Outpatient Medications  Medication Sig Dispense Refill  . aspirin 81 MG tablet Take 81 mg by mouth daily.    . clopidogrel (PLAVIX) 75 MG tablet Take 1  tablet (75 mg total) by mouth daily. Patient takes this medication in the place of Asprin when he gets his allergy shot in January. 30 tablet 0  . ezetimibe (ZETIA) 10 MG tablet Take 1 tablet (10 mg total) by mouth daily. 90 tablet 3  . metoprolol succinate (TOPROL-XL) 50 MG 24 hr tablet TAKE 1/2 TABLET BY MOUTH IN THE MORNING AND TAKE 1 TABLET BY MOUTH IN THE EVENING. Please keep upcoming appt in March with Dr. Marlou Porch. Thanks 135 tablet 0  . Multiple Vitamins-Minerals (MULTIVITAMIN PO) Take by mouth  daily.    . nitroGLYCERIN (NITROSTAT) 0.4 MG SL tablet Place 1 tablet (0.4 mg total) under the tongue every 5 (five) minutes as needed for chest pain. 25 tablet 11  . omeprazole (PRILOSEC) 40 MG capsule Take 40 mg by mouth daily.    . pantoprazole (PROTONIX) 40 MG tablet Take 40 mg by mouth daily.    . rosuvastatin (CRESTOR) 40 MG tablet Take 1 tablet (40 mg total) by mouth daily. 90 tablet 0  . vardenafil (LEVITRA) 20 MG tablet Take 20 mg by mouth daily as needed for erectile dysfunction.     No current facility-administered medications for this visit.     Allergies:    Allergies  Allergen Reactions  . Sulfa Antibiotics Nausea And Vomiting    Makes sick   . Tetracyclines & Related Nausea And Vomiting    Makes sick    Social History:  The patient  reports that he has never smoked. He has never used smokeless tobacco. He reports current alcohol use of about 2.0 standard drinks of alcohol per week. He reports that he does not use drugs.   ROS:  Please see the history of present illness.  Other ROS neg.   PHYSICAL EXAM: VS:  BP 140/80   Pulse (!) 58   Ht 6\' 1"  (1.854 m)   Wt 225 lb 12.8 oz (102.4 kg)   BMI 29.79 kg/m  GEN: Well nourished, well developed, in no acute distress  HEENT: normal  Neck: no JVD, carotid bruits, or masses Cardiac: RRR; no murmurs, rubs, or gallops,no edema  Respiratory:  clear to auscultation bilaterally, normal work of breathing GI: soft, nontender, nondistended, + BS MS: no deformity or atrophy  Skin: warm and dry, no rash Neuro:  Alert and Oriented x 3, Strength and sensation are intact Psych: euthymic mood, full affect    Nuclear stress test: 07/15/14-low risk, no ischemia, normal EF  Labs: LDL 89 - 12/08/16  EKG:  EKG today 03/11/2018-sinus bradycardia 58 with nonspecific ST-T wave changes-03/06/17 - SB LAD. Personally viewed. 08/11/15-sinus bradycardia rate 55, left axis deviation, old inferior infarct pattern personally viewed-prior 07/15/13-sinus  bradycardia 53, left axis deviation.  10/22/13-sinus bradycardia rate 57, left axis deviation, nonspecific ST-T wave changes  48 hour monitor prior - OK. No adverse arrhythmias  ASSESSMENT AND PLAN:  1. Coronary artery disease/angina/palpitation- after the heat of the summer, in [place], [place], he was having more palpitations in the evening hours that was worrisome to him. Throat tightness as well which has been for many years. Discussed with Pam who suggested taking 50 mg at bedtime and 25 mg in the morning of metoprolol. Since he has made that change, he is feeling much better. He feels as though he is back on track. Stress test July 2016 low risk, no ischemia. Continue with current plan. Prior bypass. Enjoys playing golf. Continue to hydrate well.  I have given him amlodipine 5 mg on an  as-needed basis if he is going through a run of what he thinks is esophageal spasm. I do not want him to take it on a daily basis because his blood pressures can get fairly low. Angina currently well controlled. Had one more esophageal episode since then.  No changes made currently.  Continue to monitor. 2. GERD-omeprazole, no current issues, per Dr. Sabra Heck.  No changes. 3. Hyperlipidemia-LDL 67, excellent, Crestor 40. Zetia 10. No changes made in medications.  No myalgias 4. Palpitations-Sometimes this wakes him up from a daily nap. He describes compensatory pause. Very brief shortness of breath. PVCs. Prior Holter monitor reassuring. Continue with metoprolol. Now at 50 evening 25 AM.  I am fine with him taking either the metoprolol tartrate or succinate whenever he was receiving last. 5. Hypertension-blood pressure well controlled Lisinopril is off. He seems to overall be doing much better. Occasional orthostatic like symptoms(had symptoms once when bending over to grab a golf ball have resolved). Continue to monitor.  We will continue with metoprolol for antianginal support.  No changes made.  Blood pressure  little bit elevated during this visit but usually it is good. 6. Impaired glucose tolerance-hemoglobin A1C was 6.0 again. Discussed weight loss again.  Keep an eye on this. 7. ED - ok with Levetra.  Not discussed today. 8. I will see him back in one year  Signed, Candee Furbish, MD [facility]  03/11/2018 9:50 AM

## 2018-03-11 NOTE — Patient Instructions (Signed)
Medication Instructions:  The current medical regimen is effective;  continue present plan and medications.  If you need a refill on your cardiac medications before your next appointment, please call your pharmacy.   Follow-Up: At [facility], you and your health needs are our priority.  As part of our continuing mission to provide you with exceptional heart care, we have created designated Provider Care Teams.  These Care Teams include your primary Cardiologist (physician) and Advanced Practice Providers (APPs -  Physician Assistants and Nurse Practitioners) who all work together to provide you with the care you need, when you need it. You will need a follow up appointment in 12 months.  Please call our office 2 months in advance to schedule this appointment.  You may see Mark Skains, MD or one of the following Advanced Practice Providers on your designated Care Team:   Lori Gerhardt, NP Laura Ingold, NP . Jill McDaniel, NP  Thank you for choosing [facility]!!      

## 2018-03-13 DIAGNOSIS — L821 Other seborrheic keratosis: Secondary | ICD-10-CM | POA: Diagnosis not present

## 2018-03-13 DIAGNOSIS — L570 Actinic keratosis: Secondary | ICD-10-CM | POA: Diagnosis not present

## 2018-03-13 DIAGNOSIS — Z85820 Personal history of malignant melanoma of skin: Secondary | ICD-10-CM | POA: Diagnosis not present

## 2018-03-13 DIAGNOSIS — D485 Neoplasm of uncertain behavior of skin: Secondary | ICD-10-CM | POA: Diagnosis not present

## 2018-03-13 DIAGNOSIS — D229 Melanocytic nevi, unspecified: Secondary | ICD-10-CM | POA: Diagnosis not present

## 2018-03-14 ENCOUNTER — Other Ambulatory Visit: Payer: Self-pay | Admitting: Cardiology

## 2018-03-20 DIAGNOSIS — M542 Cervicalgia: Secondary | ICD-10-CM | POA: Diagnosis not present

## 2018-04-03 ENCOUNTER — Other Ambulatory Visit: Payer: Self-pay | Admitting: Cardiology

## 2018-04-30 ENCOUNTER — Other Ambulatory Visit: Payer: Self-pay | Admitting: Cardiology

## 2018-06-05 DIAGNOSIS — M542 Cervicalgia: Secondary | ICD-10-CM | POA: Diagnosis not present

## 2018-06-20 DIAGNOSIS — M542 Cervicalgia: Secondary | ICD-10-CM | POA: Diagnosis not present

## 2018-07-06 ENCOUNTER — Other Ambulatory Visit: Payer: Self-pay | Admitting: Cardiology

## 2018-07-09 DIAGNOSIS — M542 Cervicalgia: Secondary | ICD-10-CM | POA: Diagnosis not present

## 2018-07-10 ENCOUNTER — Other Ambulatory Visit: Payer: Self-pay | Admitting: Cardiology

## 2018-07-30 DIAGNOSIS — M542 Cervicalgia: Secondary | ICD-10-CM | POA: Diagnosis not present

## 2018-08-20 DIAGNOSIS — M542 Cervicalgia: Secondary | ICD-10-CM | POA: Diagnosis not present

## 2018-09-10 DIAGNOSIS — D485 Neoplasm of uncertain behavior of skin: Secondary | ICD-10-CM | POA: Diagnosis not present

## 2018-09-10 DIAGNOSIS — L821 Other seborrheic keratosis: Secondary | ICD-10-CM | POA: Diagnosis not present

## 2018-09-24 DIAGNOSIS — Z23 Encounter for immunization: Secondary | ICD-10-CM | POA: Diagnosis not present

## 2018-10-01 DIAGNOSIS — H43813 Vitreous degeneration, bilateral: Secondary | ICD-10-CM | POA: Diagnosis not present

## 2018-10-01 DIAGNOSIS — H40013 Open angle with borderline findings, low risk, bilateral: Secondary | ICD-10-CM | POA: Diagnosis not present

## 2018-10-01 DIAGNOSIS — H35372 Puckering of macula, left eye: Secondary | ICD-10-CM | POA: Diagnosis not present

## 2018-10-01 DIAGNOSIS — H26492 Other secondary cataract, left eye: Secondary | ICD-10-CM | POA: Diagnosis not present

## 2018-10-01 DIAGNOSIS — Z961 Presence of intraocular lens: Secondary | ICD-10-CM | POA: Diagnosis not present

## 2018-10-01 DIAGNOSIS — H43821 Vitreomacular adhesion, right eye: Secondary | ICD-10-CM | POA: Diagnosis not present

## 2018-10-02 DIAGNOSIS — M542 Cervicalgia: Secondary | ICD-10-CM | POA: Diagnosis not present

## 2018-10-03 ENCOUNTER — Other Ambulatory Visit: Payer: Self-pay | Admitting: Cardiology

## 2018-10-15 DIAGNOSIS — M542 Cervicalgia: Secondary | ICD-10-CM | POA: Diagnosis not present

## 2018-10-30 DIAGNOSIS — M542 Cervicalgia: Secondary | ICD-10-CM | POA: Diagnosis not present

## 2018-11-19 DIAGNOSIS — M542 Cervicalgia: Secondary | ICD-10-CM | POA: Diagnosis not present

## 2018-12-10 DIAGNOSIS — M542 Cervicalgia: Secondary | ICD-10-CM | POA: Diagnosis not present

## 2018-12-18 DIAGNOSIS — C61 Malignant neoplasm of prostate: Secondary | ICD-10-CM | POA: Diagnosis not present

## 2018-12-27 ENCOUNTER — Other Ambulatory Visit: Payer: Self-pay | Admitting: Cardiology

## 2019-01-21 DIAGNOSIS — M542 Cervicalgia: Secondary | ICD-10-CM | POA: Diagnosis not present

## 2019-02-05 DIAGNOSIS — M542 Cervicalgia: Secondary | ICD-10-CM | POA: Diagnosis not present

## 2019-02-25 DIAGNOSIS — M542 Cervicalgia: Secondary | ICD-10-CM | POA: Diagnosis not present

## 2019-03-10 DIAGNOSIS — R21 Rash and other nonspecific skin eruption: Secondary | ICD-10-CM | POA: Diagnosis not present

## 2019-03-10 DIAGNOSIS — Z77120 Contact with and (suspected) exposure to mold (toxic): Secondary | ICD-10-CM | POA: Diagnosis not present

## 2019-03-10 DIAGNOSIS — J305 Allergic rhinitis due to food: Secondary | ICD-10-CM | POA: Diagnosis not present

## 2019-03-10 DIAGNOSIS — K580 Irritable bowel syndrome with diarrhea: Secondary | ICD-10-CM | POA: Diagnosis not present

## 2019-03-10 DIAGNOSIS — S0592XA Unspecified injury of left eye and orbit, initial encounter: Secondary | ICD-10-CM | POA: Diagnosis not present

## 2019-03-10 DIAGNOSIS — J310 Chronic rhinitis: Secondary | ICD-10-CM | POA: Diagnosis not present

## 2019-03-10 DIAGNOSIS — J3489 Other specified disorders of nose and nasal sinuses: Secondary | ICD-10-CM | POA: Diagnosis not present

## 2019-03-12 ENCOUNTER — Ambulatory Visit (INDEPENDENT_AMBULATORY_CARE_PROVIDER_SITE_OTHER): Payer: Medicare Other | Admitting: Cardiology

## 2019-03-12 ENCOUNTER — Other Ambulatory Visit: Payer: Self-pay

## 2019-03-12 ENCOUNTER — Encounter: Payer: Self-pay | Admitting: Cardiology

## 2019-03-12 VITALS — BP 158/70 | HR 58 | Ht 73.0 in | Wt 226.0 lb

## 2019-03-12 DIAGNOSIS — E785 Hyperlipidemia, unspecified: Secondary | ICD-10-CM | POA: Diagnosis not present

## 2019-03-12 DIAGNOSIS — I2510 Atherosclerotic heart disease of native coronary artery without angina pectoris: Secondary | ICD-10-CM | POA: Diagnosis not present

## 2019-03-12 DIAGNOSIS — Z79899 Other long term (current) drug therapy: Secondary | ICD-10-CM

## 2019-03-12 DIAGNOSIS — I209 Angina pectoris, unspecified: Secondary | ICD-10-CM | POA: Diagnosis not present

## 2019-03-12 DIAGNOSIS — I10 Essential (primary) hypertension: Secondary | ICD-10-CM | POA: Diagnosis not present

## 2019-03-12 LAB — COMPREHENSIVE METABOLIC PANEL
ALT: 47 IU/L — ABNORMAL HIGH (ref 0–44)
AST: 36 IU/L (ref 0–40)
Albumin/Globulin Ratio: 2.2 (ref 1.2–2.2)
Albumin: 4.7 g/dL (ref 3.8–4.8)
Alkaline Phosphatase: 44 IU/L (ref 39–117)
BUN/Creatinine Ratio: 16 (ref 10–24)
BUN: 22 mg/dL (ref 8–27)
Bilirubin Total: 0.3 mg/dL (ref 0.0–1.2)
CO2: 20 mmol/L (ref 20–29)
Calcium: 8.8 mg/dL (ref 8.6–10.2)
Chloride: 105 mmol/L (ref 96–106)
Creatinine, Ser: 1.35 mg/dL — ABNORMAL HIGH (ref 0.76–1.27)
GFR calc Af Amer: 61 mL/min/{1.73_m2} (ref >59)
GFR calc non Af Amer: 53 mL/min/{1.73_m2} — ABNORMAL LOW (ref >59)
Globulin, Total: 2.1 g/dL (ref 1.5–4.5)
Glucose: 136 mg/dL — ABNORMAL HIGH (ref 65–99)
Potassium: 4.4 mmol/L (ref 3.5–5.2)
Sodium: 141 mmol/L (ref 134–144)
Total Protein: 6.8 g/dL (ref 6.0–8.5)

## 2019-03-12 LAB — CBC
Hematocrit: 41.5 % (ref 37.5–51.0)
Hemoglobin: 14.6 g/dL (ref 13.0–17.7)
MCH: 31.8 pg (ref 26.6–33.0)
MCHC: 35.2 g/dL (ref 31.5–35.7)
MCV: 90 fL (ref 79–97)
Platelets: 206 10*3/uL (ref 150–450)
RBC: 4.59 x10E6/uL (ref 4.14–5.80)
RDW: 12.9 % (ref 11.6–15.4)
WBC: 6.7 10*3/uL (ref 3.4–10.8)

## 2019-03-12 LAB — LIPID PANEL
Chol/HDL Ratio: 4.2 ratio (ref 0.0–5.0)
Cholesterol, Total: 134 mg/dL (ref 100–199)
HDL: 32 mg/dL — ABNORMAL LOW (ref >39)
LDL Chol Calc (NIH): 58 mg/dL (ref 0–99)
Triglycerides: 282 mg/dL — ABNORMAL HIGH (ref 0–149)
VLDL Cholesterol Cal: 44 mg/dL — ABNORMAL HIGH (ref 5–40)

## 2019-03-12 NOTE — Patient Instructions (Signed)
Medication Instructions:  The current medical regimen is effective;  continue present plan and medications.  *If you need a refill on your cardiac medications before your next appointment, please call your pharmacy*  Lab Work: Please have blood work today (CBC, CMP, Lipid) If you have labs (blood work) drawn today and your tests are completely normal, you will receive your results only by: Marland Kitchen MyChart Message (if you have MyChart) OR . A paper copy in the mail If you have any lab test that is abnormal or we need to change your treatment, we will call you to review the results.  Follow-Up: At [facility], you and your health needs are our priority.  As part of our continuing mission to provide you with exceptional heart care, we have created designated Provider Care Teams.  These Care Teams include your primary Cardiologist (physician) and Advanced Practice Providers (APPs -  Physician Assistants and Nurse Practitioners) who all work together to provide you with the care you need, when you need it.  We recommend signing up for the patient portal called "MyChart".  Sign up information is provided on this After Visit Summary.  MyChart is used to connect with patients for Virtual Visits (Telemedicine).  Patients are able to view lab/test results, encounter notes, upcoming appointments, etc.  Non-urgent messages can be sent to your provider as well.   To learn more about what you can do with MyChart, go to [email or web address].    Your next appointment:   12 month(s)  The format for your next appointment:   In Person  Provider:   Candee Furbish, MD   Thank you for choosing [facility]!!

## 2019-03-12 NOTE — Progress Notes (Signed)
Cardiology Office Note:    Date:  03/12/2019   ID:  Collin Morse, DOB 1950-10-31, MRN [id number]  PCP:  Collin Stalker, PA-C  Cardiologist:  Collin Furbish, MD  Electrophysiologist:  None   Referring MD: Collin Stalker, PA-C     History of Present Illness:    QUI SIT is a 69 y.o. male here for the follow-up of coronary artery disease.  Prior CABG 2011 LIMA to LAD SVG to circumflex.  Has hypertension hyperlipidemia.  Prior symptoms with throat tightness and palpitations.  This is sometimes a seasonal type event in the summertime when the weather is hot he feels the symptoms.  Had nuclear stress test 2013 and 2016 that were both low risk.  Last year was doing quite well.  Currently also doing well.  No fevers chills nausea vomiting syncope.  No problems with medications.  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  Less stress.  He and Collin Morse, his wife, are doing well.  Unfortunately he did slip and fall when running down the stairs and bruised his left eye.  Past Medical History:  Diagnosis Date  . Coronary artery disease 2011  . Coronary atherosclerosis of native coronary artery 12/18/2012   CABG LIMA to LAD, SVG to circumflex 01/2009  . Dizziness and giddiness   . GERD (gastroesophageal reflux disease)   . Hernia   . HTN (hypertension) since 2008  . Hyperlipemia   . Hypothyroidism   . Prostate cancer [facility])     Past Surgical History:  Procedure Laterality Date  . CATARACT EXTRACTION     Bilateral  . CORONARY ARTERY BYPASS GRAFT  2011  . GAS INSERTION Right 04/24/2016   Procedure: INSERTION OF GAS- SF6;  Surgeon: Jalene Mullet, MD;  Location: [place];  Service: Ophthalmology;  Laterality: Right;  . LASER PHOTO ABLATION Right 04/24/2016   Procedure: LASER PHOTO ABLATION-ENDOLASER;  Surgeon: Jalene Mullet, MD;  Location: [place];   Service: Ophthalmology;  Laterality: Right;  . PARS PLANA VITRECTOMY Right 04/24/2016   Procedure: PARS PLANA VITRECTOMY WITH 25 GAUGE;  Surgeon: Jalene Mullet, MD;  Location: [place];  Service: Ophthalmology;  Laterality: Right;  . PROSTATE SURGERY  2006    Current Medications: Current Meds  Medication Sig  . aspirin 81 MG tablet Take 81 mg by mouth daily.  . clopidogrel (PLAVIX) 75 MG tablet TAKE 1 TAB DAILY. PT TAKES MED IN THE PLACE OF ASPRIN WHEN HE GETS HIS ALLERGY SHOT IN [place].  Marland Kitchen ezetimibe (ZETIA) 10 MG tablet TAKE 1 TABLET BY MOUTH EVERY DAY  . metoprolol succinate (TOPROL-XL) 50 MG 24 hr tablet TAKE 1/2 TABLET BY MOUTH IN THE MORNING AND TAKE 1 TABLET BY MOUTH IN THE EVENING.  . Multiple Vitamins-Minerals (MULTIVITAMIN PO) Take by mouth daily.  . nitroGLYCERIN (NITROSTAT) 0.4 MG SL tablet PLACE 1 TABLET (0.4 MG TOTAL) UNDER THE TONGUE EVERY 5 (FIVE) MINUTES AS NEEDED FOR CHEST PAIN.  Marland Kitchen omeprazole (PRILOSEC) 40 MG capsule Take 40 mg by mouth daily.  . pantoprazole (PROTONIX) 40 MG tablet Take 40 mg by mouth daily.  . rosuvastatin (CRESTOR) 40 MG tablet TAKE 1 TABLET BY MOUTH EVERY DAY  . vardenafil (LEVITRA) 20 MG tablet Take 20 mg by mouth daily as needed for erectile dysfunction.     Allergies:   Sulfa antibiotics and Tetracyclines & related  Social History   Socioeconomic History  . Marital status: Married    Spouse name: Collin Morse  . Number of children: 3  . Years of education: college  . Highest education level: Not on file  Occupational History  . Occupation: New Environmental education officer: [employer]  Tobacco Use  . Smoking status: Never Smoker  . Smokeless tobacco: Never Used  Substance and Sexual Activity  . Alcohol use: Yes    Alcohol/week: 2.0 standard drinks    Types: 2 Cans of beer per week    Comment: OCC  . Drug use: No  . Sexual activity: Not on file  Other Topics Concern  . Not on file  Social History Narrative   Patient works for  [employer], Teacher, English as a foreign language, Patient lives at home with his wife Collin Morse). College education. Right handed. 3 children   Caffeine- two glasses daily ice tea.   Social Determinants of Health   Financial Resource Strain:   . Difficulty of Paying Living Expenses: Not on file  Food Insecurity:   . Worried About Charity fundraiser in the Last Year: Not on file  . Ran Out of Food in the Last Year: Not on file  Transportation Needs:   . Lack of Transportation (Medical): Not on file  . Lack of Transportation (Non-Medical): Not on file  Physical Activity:   . Days of Exercise per Week: Not on file  . Minutes of Exercise per Session: Not on file  Stress:   . Feeling of Stress : Not on file  Social Connections:   . Frequency of Communication with Friends and Family: Not on file  . Frequency of Social Gatherings with Friends and Family: Not on file  . Attends Religious Services: Not on file  . Active Member of Clubs or Organizations: Not on file  . Attends Archivist Meetings: Not on file  . Marital Status: Not on file     Family History: The patient's family history includes Dementia in his mother; Diabetes type II in his father and mother; Heart Problems in his father; High blood pressure in his mother; Lung cancer in his mother.  ROS:   Please see the history of present illness.     All other systems reviewed and are negative.  EKGs/Labs/Other Studies Reviewed:    The following studies were reviewed today: 48-hour Holter monitor previously-no adverse arrhythmias  EKG:  EKG is  ordered today.  The ekg ordered today demonstrates sinus bradycardia 58 with nonspecific ST-T wave changes no change from prior prior EKG 03/11/2018 showed sinus bradycardia 58 nonspecific ST-T wave changes.  Recent Labs: No results found for requested labs within last 8760 hours.  Recent Lipid Panel    Component Value Date/Time   CHOL 133 07/16/2014 0756   TRIG 92.0 07/16/2014 0756   HDL 42.30  07/16/2014 0756   CHOLHDL 3 07/16/2014 0756   VLDL 18.4 07/16/2014 0756   LDLCALC 72 07/16/2014 0756    Physical Exam:    VS:  BP (!) 158/70   Pulse (!) 58   Ht 6\' 1"  (1.854 m)   Wt 226 lb (102.5 kg)   SpO2 99%   BMI 29.82 kg/m     Wt Readings from Last 3 Encounters:  03/12/19 226 lb (102.5 kg)  03/11/18 225 lb 12.8 oz (102.4 kg)  11/10/16 199 lb 9.6 oz (90.5 kg)     GEN:  Well nourished, well developed in no acute distress HEENT: Left  eye ecchymosis from fall NECK: No JVD; No carotid bruits LYMPHATICS: No lymphadenopathy CARDIAC: RRR, no murmurs, rubs, gallops RESPIRATORY:  Clear to auscultation without rales, wheezing or rhonchi  ABDOMEN: Soft, non-tender, non-distended MUSCULOSKELETAL:  No edema; No deformity  SKIN: Warm and dry NEUROLOGIC:  Alert and oriented x 3 PSYCHIATRIC:  Normal affect   ASSESSMENT:    1. Atherosclerosis of native coronary artery of native heart without angina pectoris   2. Angina pectoris ([place])   3. Essential hypertension   4. Medication management   5. Hyperlipidemia, unspecified hyperlipidemia type    PLAN:    In order of problems listed above:  Coronary artery disease status post CABG -Previously changed timing of metoprolol taking 50 mg at bedtime and 25 mg in the morning, felt better with this.  Prior stress test 2 of which were in 2013 in 2016 were both low risk.  Enjoys golf. -Amlodipine 5 mg as needed was also given for potential esophageal spasm.  I was worried about him taking it daily because of low blood pressures at times.  We will check a CBC. -Only takes Plavix during his week of seasonal allergy shots.  Hyperlipidemia -LDL goal less than 70.  In the past it has been 67 from outside labs.  He is also on Zetia 10 mg, Crestor 40.  No changes.  We will also check lipid panel  Palpitations -Hopefully metoprolol continues to help.  50 mg evening 25 mg a.m.  Good description of compensatory pause.  Essential  hypertension -Has had low blood pressure in the past.  Symptoms exacerbated when bending over to grab a golf ball for instance at times.  Blood pressure is a little bit elevated today but usually it is normal.  Continue to monitor.  We will go ahead and check a complete metabolic profile   Prostate cancer -Prior prostatectomy 2005.  Followed at [facility].  Cialis long-acting, knows not to take nitroglycerin we discussed.  Note from [facility] reviewed.  PSA has been low.  0.02.    Medication Adjustments/Labs and Tests Ordered: Current medicines are reviewed at length with the patient today.  Concerns regarding medicines are outlined above.  Orders Placed This Encounter  Procedures  . CBC  . Comprehensive metabolic panel  . Lipid panel  . EKG 12-Lead   No orders of the defined types were placed in this encounter.   Patient Instructions  Medication Instructions:  The current medical regimen is effective;  continue present plan and medications.  *If you need a refill on your cardiac medications before your next appointment, please call your pharmacy*  Lab Work: Please have blood work today (CBC, CMP, Lipid) If you have labs (blood work) drawn today and your tests are completely normal, you will receive your results only by: Marland Kitchen MyChart Message (if you have MyChart) OR . A paper copy in the mail If you have any lab test that is abnormal or we need to change your treatment, we will call you to review the results.  Follow-Up: At [facility], you and your health needs are our priority.  As part of our continuing mission to provide you with exceptional heart care, we have created designated Provider Care Teams.  These Care Teams include your primary Cardiologist (physician) and Advanced Practice Providers (APPs -  Physician Assistants and Nurse Practitioners) who all work together to provide you with the care you need, when you need it.  We recommend signing up for the patient portal  called "MyChart".  Sign up information is provided on this After Visit Summary.  MyChart is used to connect with patients for Virtual Visits (Telemedicine).  Patients are able to view lab/test results, encounter notes, upcoming appointments, etc.  Non-urgent messages can be sent to your provider as well.   To learn more about what you can do with MyChart, go to [email or web address].    Your next appointment:   12 month(s)  The format for your next appointment:   In Person  Provider:   Candee Furbish, MD   Thank you for choosing [facility]!!         Signed, Collin Furbish, MD  03/12/2019 9:07 AM    [place]

## 2019-03-22 ENCOUNTER — Other Ambulatory Visit: Payer: Self-pay | Admitting: Cardiology

## 2019-03-25 DIAGNOSIS — M542 Cervicalgia: Secondary | ICD-10-CM | POA: Diagnosis not present

## 2019-03-29 ENCOUNTER — Other Ambulatory Visit: Payer: Self-pay | Admitting: Cardiology

## 2019-04-11 DIAGNOSIS — R197 Diarrhea, unspecified: Secondary | ICD-10-CM | POA: Diagnosis not present

## 2019-04-11 DIAGNOSIS — R109 Unspecified abdominal pain: Secondary | ICD-10-CM | POA: Diagnosis not present

## 2019-04-11 DIAGNOSIS — K580 Irritable bowel syndrome with diarrhea: Secondary | ICD-10-CM | POA: Diagnosis not present

## 2019-04-14 ENCOUNTER — Other Ambulatory Visit: Payer: Self-pay | Admitting: Cardiology

## 2019-04-21 DIAGNOSIS — M542 Cervicalgia: Secondary | ICD-10-CM | POA: Diagnosis not present

## 2019-05-08 DIAGNOSIS — M542 Cervicalgia: Secondary | ICD-10-CM | POA: Diagnosis not present

## 2019-05-13 DIAGNOSIS — Z77120 Contact with and (suspected) exposure to mold (toxic): Secondary | ICD-10-CM | POA: Diagnosis not present

## 2019-05-13 DIAGNOSIS — K209 Esophagitis, unspecified without bleeding: Secondary | ICD-10-CM | POA: Diagnosis not present

## 2019-05-13 DIAGNOSIS — K580 Irritable bowel syndrome with diarrhea: Secondary | ICD-10-CM | POA: Diagnosis not present

## 2019-05-13 DIAGNOSIS — J301 Allergic rhinitis due to pollen: Secondary | ICD-10-CM | POA: Diagnosis not present

## 2019-05-21 DIAGNOSIS — M542 Cervicalgia: Secondary | ICD-10-CM | POA: Diagnosis not present

## 2019-05-22 DIAGNOSIS — E782 Mixed hyperlipidemia: Secondary | ICD-10-CM | POA: Diagnosis not present

## 2019-05-22 DIAGNOSIS — R7303 Prediabetes: Secondary | ICD-10-CM | POA: Diagnosis not present

## 2019-05-22 DIAGNOSIS — E039 Hypothyroidism, unspecified: Secondary | ICD-10-CM | POA: Diagnosis not present

## 2019-05-22 DIAGNOSIS — I10 Essential (primary) hypertension: Secondary | ICD-10-CM | POA: Diagnosis not present

## 2019-05-27 DIAGNOSIS — Z1211 Encounter for screening for malignant neoplasm of colon: Secondary | ICD-10-CM | POA: Diagnosis not present

## 2019-05-27 DIAGNOSIS — Z23 Encounter for immunization: Secondary | ICD-10-CM | POA: Diagnosis not present

## 2019-05-27 DIAGNOSIS — R21 Rash and other nonspecific skin eruption: Secondary | ICD-10-CM | POA: Diagnosis not present

## 2019-05-27 DIAGNOSIS — I10 Essential (primary) hypertension: Secondary | ICD-10-CM | POA: Diagnosis not present

## 2019-05-27 DIAGNOSIS — R739 Hyperglycemia, unspecified: Secondary | ICD-10-CM | POA: Diagnosis not present

## 2019-05-27 DIAGNOSIS — E782 Mixed hyperlipidemia: Secondary | ICD-10-CM | POA: Diagnosis not present

## 2019-05-27 DIAGNOSIS — Z0000 Encounter for general adult medical examination without abnormal findings: Secondary | ICD-10-CM | POA: Diagnosis not present

## 2019-05-27 DIAGNOSIS — Z1159 Encounter for screening for other viral diseases: Secondary | ICD-10-CM | POA: Diagnosis not present

## 2019-06-04 DIAGNOSIS — M542 Cervicalgia: Secondary | ICD-10-CM | POA: Diagnosis not present

## 2019-06-10 ENCOUNTER — Ambulatory Visit (INDEPENDENT_AMBULATORY_CARE_PROVIDER_SITE_OTHER): Payer: Medicare Other | Admitting: Dermatology

## 2019-06-10 ENCOUNTER — Other Ambulatory Visit: Payer: Self-pay

## 2019-06-10 ENCOUNTER — Encounter: Payer: Self-pay | Admitting: Dermatology

## 2019-06-10 DIAGNOSIS — I209 Angina pectoris, unspecified: Secondary | ICD-10-CM

## 2019-06-10 DIAGNOSIS — L400 Psoriasis vulgaris: Secondary | ICD-10-CM

## 2019-06-10 DIAGNOSIS — L309 Dermatitis, unspecified: Secondary | ICD-10-CM | POA: Diagnosis not present

## 2019-06-10 MED ORDER — CLOBETASOL PROPIONATE 0.05 % EX FOAM: Freq: Two times a day (BID) | CUTANEOUS | 2 refills | Status: DC

## 2019-06-10 NOTE — Progress Notes (Signed)
° °  Follow-Up Visit   Subjective  Collin Morse is a 69 y.o. male who presents for the following: Rash (chest, underarms, hips and groin x 6 months- itches sometimes tx- ? fungal per PCP- otc Lotrimin -no help).  Rash Location: All over Duration: Months Quality: Waxes and wanes but recently spreading Associated Signs/Symptoms: Very variable itch Modifying Factors:  Severity:  Timing: Context:   The following portions of the chart were reviewed this encounter and updated as appropriate: Tobacco   Allergies   Meds   Problems   Med Hx   Surg Hx   Fam Hx       Objective  Well appearing patient in no apparent distress; mood and affect are within normal limits.  A full examination was performed including scalp, head, eyes, ears, nose, lips, neck, chest, axillae, abdomen, back, buttocks, bilateral upper extremities, bilateral lower extremities, hands, feet, fingers, toes, fingernails, and toenails. All findings within normal limits unless otherwise noted below.   Assessment & Plan  Dermatitis (13) Right Elbow - Posterior; Neck - Posterior; Left Upper Arm - Posterior (2); Right Upper Arm - Posterior; Left Thigh - Anterior (2); Right Thigh - Anterior (2); Left Upper Back; Right Upper Back; Scalp (2)  May use the same clobetasol daily after bathing for up to 30 days.  Avoid use on face and body folds.  Plaque psoriasis (2) Right Occipital Scalp; Left Postauricular Area  Clobetasol foam or sln Follow up visit for Collin Morse. Collin Morse Date of birth 1950-07-25.  Previously he mentioned a rash which was clear when he was seen but it has recurred with somewhat of a vengeance in the last few months.  He has consulted both his [place] assistant as well as Dr. Lovena Le who is an allergist who does his regular allergy evaluation and shots.  Today the rash is remarkably polymorphic; on the back of the scalp it looks very much like psoriasis; on the arms it looks like hives (and historically it seems  to come and go.)  I did test for scratch urticaria on Collin Morse's back and this was negative.  On the chest it looks more like adult papular eczema.  On the hips it most resembles a subtle papulosquamous rash like pityriasis lichenoides.  And finally in the left leg crease it looks very much like jock itch/fungus (oddly, his feet were clear of fungus).  I suspect when we boil it down to simplest terms, that Collin Morse has a chronic fungus in his groin which is unrelated to the extensive symmetrical rash which is likely immune mediated, more likely autoimmune than environmental allergen.  Initially he will try a clobetasol, ideally foam (if not allowed by his insurance than II will okay either the solution or the gel) applied once daily to his wet skin after bathing not to be used on the face or body folds.  He will continue using an over-the-counter antifungal like Lotrimin for the leg crease once daily after showering.  Finally for the next 2 weeks have asked him to take 2 daily of either Zyrtec or Claritin or the generic equivalents (he is aware that instructions that come with these medications will say it is a once daily dosing).  I will try to add Collin Morse on to be seen in 2 weeks to decide on whether biopsies would be helpful; if the rash is mostly improved at that point, he may cancel that visit.

## 2019-06-15 ENCOUNTER — Encounter: Payer: Self-pay | Admitting: Dermatology

## 2019-06-23 ENCOUNTER — Telehealth: Payer: Self-pay | Admitting: Dermatology

## 2019-06-23 NOTE — Telephone Encounter (Signed)
Patient called and cancelled follow up scheduled for this Thursday 06/26/2019.  Patient does want to know if he should continue using foam (he can't remember name) since rash is much improved? (Chart # [id number])

## 2019-06-23 NOTE — Telephone Encounter (Signed)
Phone call to patient to inform him that per Dr. Onalee Hua office note patient can use the Clobetasol Foam for 30 days.  Patient aware.

## 2019-06-25 DIAGNOSIS — M542 Cervicalgia: Secondary | ICD-10-CM | POA: Diagnosis not present

## 2019-06-26 ENCOUNTER — Ambulatory Visit: Payer: Medicare Other | Admitting: Dermatology

## 2019-07-29 ENCOUNTER — Emergency Department (HOSPITAL_COMMUNITY)
Admission: EM | Admit: 2019-07-29 | Discharge: 2019-07-29 | Disposition: A | Discharge status: Home / Self Care | Payer: Medicare Other | Attending: Emergency Medicine | Admitting: Emergency Medicine

## 2019-07-29 ENCOUNTER — Emergency Department (HOSPITAL_COMMUNITY): Payer: Medicare Other

## 2019-07-29 ENCOUNTER — Encounter (HOSPITAL_COMMUNITY): Payer: Self-pay

## 2019-07-29 DIAGNOSIS — I2510 Atherosclerotic heart disease of native coronary artery without angina pectoris: Secondary | ICD-10-CM | POA: Insufficient documentation

## 2019-07-29 DIAGNOSIS — Z7982 Long term (current) use of aspirin: Secondary | ICD-10-CM | POA: Diagnosis not present

## 2019-07-29 DIAGNOSIS — M542 Cervicalgia: Secondary | ICD-10-CM | POA: Insufficient documentation

## 2019-07-29 DIAGNOSIS — R079 Chest pain, unspecified: Secondary | ICD-10-CM | POA: Diagnosis not present

## 2019-07-29 DIAGNOSIS — Z951 Presence of aortocoronary bypass graft: Secondary | ICD-10-CM | POA: Diagnosis not present

## 2019-07-29 DIAGNOSIS — Z79899 Other long term (current) drug therapy: Secondary | ICD-10-CM | POA: Diagnosis not present

## 2019-07-29 DIAGNOSIS — I10 Essential (primary) hypertension: Secondary | ICD-10-CM | POA: Diagnosis not present

## 2019-07-29 DIAGNOSIS — Z8546 Personal history of malignant neoplasm of prostate: Secondary | ICD-10-CM | POA: Diagnosis not present

## 2019-07-29 DIAGNOSIS — R072 Precordial pain: Secondary | ICD-10-CM

## 2019-07-29 DIAGNOSIS — E039 Hypothyroidism, unspecified: Secondary | ICD-10-CM | POA: Diagnosis not present

## 2019-07-29 LAB — CBC
HCT: 45.9 % (ref 39.0–52.0)
Hemoglobin: 15.2 g/dL (ref 13.0–17.0)
MCH: 31.9 pg (ref 26.0–34.0)
MCHC: 33.1 g/dL (ref 30.0–36.0)
MCV: 96.4 fL (ref 80.0–100.0)
Platelets: 217 10*3/uL (ref 150–400)
RBC: 4.76 MIL/uL (ref 4.22–5.81)
RDW: 13.2 % (ref 11.5–15.5)
WBC: 9.1 10*3/uL (ref 4.0–10.5)
nRBC: 0 % (ref 0.0–0.2)

## 2019-07-29 LAB — BASIC METABOLIC PANEL
Anion gap: 9 (ref 5–15)
BUN: 21 mg/dL (ref 8–23)
CO2: 25 mmol/L (ref 22–32)
Calcium: 9.3 mg/dL (ref 8.9–10.3)
Chloride: 103 mmol/L (ref 98–111)
Creatinine, Ser: 1.42 mg/dL — ABNORMAL HIGH (ref 0.61–1.24)
GFR calc Af Amer: 58 mL/min — ABNORMAL LOW (ref >60)
GFR calc non Af Amer: 50 mL/min — ABNORMAL LOW (ref >60)
Glucose, Bld: 139 mg/dL — ABNORMAL HIGH (ref 70–99)
Potassium: 3.7 mmol/L (ref 3.5–5.1)
Sodium: 137 mmol/L (ref 135–145)

## 2019-07-29 LAB — TROPONIN I (HIGH SENSITIVITY)
Troponin I (High Sensitivity): 6 ng/L (ref <18)
Troponin I (High Sensitivity): 6 ng/L (ref <18)
Troponin I (High Sensitivity): 4 ng/L (ref <18)

## 2019-07-29 MED ORDER — SODIUM CHLORIDE 0.9% FLUSH: 3.0000 mL | Freq: Once | INTRAVENOUS | Status: DC

## 2019-07-29 NOTE — ED Triage Notes (Signed)
Pt reports that since Sun he has been having CP with radiation to L neck and shoulder, nausea

## 2019-07-29 NOTE — Discharge Instructions (Signed)
Please read and follow all provided instructions.  Your diagnoses today include:  1. Precordial pain   2. Essential hypertension     Tests performed today include:  An EKG of your heart  A chest x-ray  Cardiac enzymes - a blood test for heart muscle damage  Blood counts and electrolytes  Vital signs. See below for your results today.   Medications prescribed:   None  Take any prescribed medications only as directed.  Follow-up instructions: Dr. Marlou Porch office will call you with a follow-up appointment.  If you do not hear from them in the next 24 hours, call the office for instructions.  Return instructions:  SEEK IMMEDIATE MEDICAL ATTENTION IF:  You have severe chest pain, especially if the pain is crushing or pressure-like and spreads to the arms, back, neck, or jaw, or if you have sweating, nausea (feeling sick to your stomach), or shortness of breath. THIS IS AN EMERGENCY. Don't wait to see if the pain will go away. Get medical help at once. Call 911 or 0 (operator). DO NOT drive yourself to the hospital.   Your chest pain gets worse and does not go away with rest.   You have an attack of chest pain lasting longer than usual, despite rest and treatment with the medications your caregiver has prescribed.   You wake from sleep with chest pain or shortness of breath.  You feel dizzy or faint.  You have chest pain not typical of your usual pain for which you originally saw your caregiver.   You have any other emergent concerns regarding your health.  Additional Information: Chest pain comes from many different causes. Your caregiver has diagnosed you as having chest pain that is not specific for one problem, but does not require admission.  You are at low risk for an acute heart condition or other serious illness.   Your vital signs today were: BP (!) 149/78   Pulse (!) 58   Temp 98.3 F (36.8 C) (Oral)   Resp (!) 21   SpO2 98%  If your blood pressure (BP) was  elevated above 135/85 this visit, please have this repeated by your doctor within one month. --------------

## 2019-07-29 NOTE — ED Provider Notes (Signed)
[facility] EMERGENCY DEPARTMENT Provider Note   CSN: [id number] Arrival date & time: 07/29/19  0214     History Chief Complaint  Patient presents with  . Chest Pain    Collin Morse is a 69 y.o. male.  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 Marlou Porch --presents to the emergency department today with complaint of chest discomfort.  Patient reports that he was at the beach over the past week where he was playing golf, walking stairs, playing with children in the family and he did not have any problems.  2 days ago while driving back from the beach patient developed some discomfort in his left chest and neck while eating a meal with friends.  He states that the symptoms lasted 20 to 30 minutes.  They were not associated with vomiting, diaphoresis.  Previous MI presented as a tightness in his throat and he did not have any typical chest pain.  He then returned home and was asymptomatic until late last night when he did not feel like himself.  He had nausea but no vomiting.  Discomfort radiated to the neck and shoulder.  He had similar symptoms around 1:30 AM today, prompting emergency department visit.  No treatments prior to arrival.  Patient drove himself to the emergency department.        Past Medical History:  Diagnosis Date  . Coronary artery disease 2011  . Coronary atherosclerosis of native coronary artery 12/18/2012   CABG LIMA to LAD, SVG to circumflex 01/2009  . Dizziness and giddiness   . GERD (gastroesophageal reflux disease)   . Hernia   . HTN (hypertension) since 2008  . Hyperlipemia   . Hypothyroidism   . Prostate cancer [facility])     Patient Active Problem List   Diagnosis Date Noted  . Upper airway cough syndrome 11/10/2016  . Esophageal spasm 07/21/2014  . Malignant neoplasm ([place]) 11/20/2013  . Male erectile dysfunction 10/14/2013  . Malignant neoplasm of prostate ([place]) 10/14/2013  . Essential  hypertension 07/15/2013  . Coronary atherosclerosis of native coronary artery 12/18/2012  . GERD (gastroesophageal reflux disease) 12/18/2012  . Numbness 05/29/2012  . Weakness 05/29/2012  . Hyperlipemia   . Neck pain   . Dizziness and giddiness     Past Surgical History:  Procedure Laterality Date  . CATARACT EXTRACTION     Bilateral  . CORONARY ARTERY BYPASS GRAFT  2011  . GAS INSERTION Right 04/24/2016   Procedure: INSERTION OF GAS- SF6;  Surgeon: Jalene Mullet, MD;  Location: [place];  Service: Ophthalmology;  Laterality: Right;  . LASER PHOTO ABLATION Right 04/24/2016   Procedure: LASER PHOTO ABLATION-ENDOLASER;  Surgeon: Jalene Mullet, MD;  Location: [place];  Service: Ophthalmology;  Laterality: Right;  . PARS PLANA VITRECTOMY Right 04/24/2016   Procedure: PARS PLANA VITRECTOMY WITH 25 GAUGE;  Surgeon: Jalene Mullet, MD;  Location: [place];  Service: Ophthalmology;  Laterality: Right;  . PROSTATE SURGERY  2006       Family History  Problem Relation Age of Onset  . Dementia Mother   . High blood pressure Mother   . Lung cancer Mother   . Diabetes type II Mother   . Heart Problems Father   . Diabetes type II Father     Social History   Tobacco Use  . Smoking status: Never Smoker  . Smokeless tobacco: Never Used  Vaping Use  . Vaping Use: Never used  Substance  Use Topics  . Alcohol use: Yes    Alcohol/week: 2.0 standard drinks    Types: 2 Cans of beer per week    Comment: OCC  . Drug use: No    Home Medications Prior to Admission medications   Medication Sig Start Date End Date Taking? Authorizing Provider  aspirin 81 MG tablet Take 81 mg by mouth daily.    [provider]  clobetasol (OLUX) 0.05 % topical foam Apply topically 2 (two) times daily. 06/10/19   Lavonna Monarch, MD  clopidogrel (PLAVIX) 75 MG tablet TAKE 1 TAB DAILY. PT TAKES MED IN THE PLACE OF ASPRIN WHEN HE GETS HIS ALLERGY SHOT IN [place]. 07/10/18   Jerline Pain, MD  ezetimibe (ZETIA) 10  MG tablet TAKE 1 TABLET BY MOUTH EVERY DAY 03/31/19   Jerline Pain, MD  metoprolol succinate (TOPROL-XL) 50 MG 24 hr tablet TAKE 1/2 TABLET BY MOUTH IN THE MORNING AND TAKE 1 TABLET BY MOUTH IN THE EVENING. 04/14/19   Jerline Pain, MD  Multiple Vitamins-Minerals (MULTIVITAMIN PO) Take by mouth daily.    [provider]  nitroGLYCERIN (NITROSTAT) 0.4 MG SL tablet PLACE 1 TABLET (0.4 MG TOTAL) UNDER THE TONGUE EVERY 5 (FIVE) MINUTES AS NEEDED FOR CHEST PAIN. 07/08/18   Jerline Pain, MD  omeprazole (PRILOSEC) 40 MG capsule Take 40 mg by mouth daily.    [provider]  pantoprazole (PROTONIX) 40 MG tablet Take 40 mg by mouth daily. 10/23/17   [provider]  rosuvastatin (CRESTOR) 40 MG tablet TAKE 1 TABLET BY MOUTH EVERY DAY 03/24/19   Jerline Pain, MD  vardenafil (LEVITRA) 20 MG tablet Take 20 mg by mouth daily as needed for erectile dysfunction.    [provider]    Allergies    Sulfa antibiotics and Tetracyclines & related  Review of Systems   Review of Systems  Constitutional: Negative for diaphoresis and fever.  Eyes: Negative for redness.  Respiratory: Positive for chest tightness. Negative for cough and shortness of breath.   Cardiovascular: Negative for chest pain, palpitations and leg swelling.  Gastrointestinal: Positive for nausea. Negative for abdominal pain and vomiting.  Genitourinary: Negative for dysuria.  Musculoskeletal: Positive for neck pain (discomfort). Negative for back pain.  Skin: Negative for rash.  Neurological: Negative for syncope and light-headedness.  Psychiatric/Behavioral: The patient is not nervous/anxious.     Physical Exam Updated Vital Signs BP (!) 169/88 (BP Location: Left Arm)   Pulse 65   Temp 98.4 F (36.9 C)   Resp 18   SpO2 98%   Physical Exam Vitals and nursing note reviewed.  Constitutional:      Appearance: He is well-developed. He is not diaphoretic.  HENT:     Head: Normocephalic and  atraumatic.     Mouth/Throat:     Mouth: Mucous membranes are not dry.  Eyes:     Conjunctiva/sclera: Conjunctivae normal.  Neck:     Vascular: Normal carotid pulses. No carotid bruit or JVD.     Trachea: Trachea normal. No tracheal deviation.  Cardiovascular:     Rate and Rhythm: Normal rate and regular rhythm.     Pulses: No decreased pulses.     Heart sounds: Normal heart sounds, S1 normal and S2 normal. Heart sounds not distant. No murmur heard.   Pulmonary:     Effort: Pulmonary effort is normal. No respiratory distress.     Breath sounds: Normal breath sounds. No wheezing.  Chest:  Chest wall: No tenderness.  Abdominal:     General: Bowel sounds are normal.     Palpations: Abdomen is soft.     Tenderness: There is no abdominal tenderness. There is no guarding or rebound.  Musculoskeletal:     Cervical back: Normal range of motion and neck supple. No muscular tenderness.  Skin:    General: Skin is warm and dry.     Coloration: Skin is not pale.  Neurological:     Mental Status: He is alert.     ED Results / Procedures / Treatments   Labs (all labs ordered are listed, but only abnormal results are displayed) Labs Reviewed  BASIC METABOLIC PANEL - Abnormal; Notable for the following components:      Result Value   Glucose, Bld 139 (*)    Creatinine, Ser 1.42 (*)    GFR calc non Af Amer 50 (*)    GFR calc Af Amer 58 (*)    All other components within normal limits  CBC  TROPONIN I (HIGH SENSITIVITY)  TROPONIN I (HIGH SENSITIVITY)  TROPONIN I (HIGH SENSITIVITY)    EKG EKG Interpretation  Date/Time:  Tuesday July 29 2019 09:58:05 EDT Ventricular Rate:  62 PR Interval:  164 QRS Duration: 114 QT Interval:  441 QTC Calculation: 448 R Axis:   -67 Text Interpretation: Sinus rhythm Incomplete RBBB and LAFB Abnormal R-wave progression, late transition No acute changes No significant change since last tracing Confirmed by Varney Biles [id number]) on 07/29/2019  10:30:15 AM   Radiology DG Chest 2 View  Result Date: 07/29/2019 CLINICAL DATA:  Chest pain. EXAM: CHEST - 2 VIEW COMPARISON:  November 01, 2016 FINDINGS: Multiple sternal wires and vascular clips are seen. There is no evidence of acute infiltrate, pleural effusion or pneumothorax. The heart size and mediastinal contours are within normal limits. The visualized skeletal structures are unremarkable. IMPRESSION: No active cardiopulmonary disease. Electronically Signed   By: Virgina Norfolk M.D.   On: 07/29/2019 03:08    Procedures Procedures (including critical care time)  Medications Ordered in ED Medications  sodium chloride flush (NS) 0.9 % injection 3 mL (has no administration in time range)    ED Course  I have reviewed the triage vital signs and the nursing notes.  Pertinent labs & imaging results that were available during my care of the patient were reviewed by me and considered in my medical decision making (see chart for details).   Patient seen and examined. Prior to being seen, work-up negative. Reviewed EKG -- ordered repeat.   Vital signs reviewed and are as follows: BP (!) 169/88 (BP Location: Left Arm)   Pulse 65   Temp 98.4 F (36.9 C)   Resp 18   SpO2 98%   Patient discussed with and seen by Dr. Kathrynn Humble.   We will obtain repeat EKG, third troponin.  If stable, will request cardiology follow-up appointment.  Patient is in agreement with this plan.  He is quite willing to follow-up with his cardiologist for recheck.   Troponin 6>6>4.  Patient continues to be pain-free.  I spoke with Wannetta Sender, who arrange with office to call patient for follow-up appointment.  Patient, on reexam, appears comfortable, sitting in hallway bed without any complaints.  Is anxious to get to an appointment this afternoon.  Patient was counseled to return with severe chest pain, especially if the pain is crushing or pressure-like and spreads to the arms, back, neck, or jaw, or if they have  sweating, nausea,  or shortness of breath with the pain. They were encouraged to call 911 with these symptoms.   They were also told to return if their chest pain gets worse and does not go away with rest, they have an attack of chest pain lasting longer than usual despite rest and treatment with the medications their caregiver has prescribed, if they wake from sleep with chest pain or shortness of breath, if they feel dizzy or faint, if they have chest pain not typical of their usual pain, or if they have any other emergent concerns regarding their health.  The patient verbalized understanding and agreed.      MDM Rules/Calculators/A&P                          Patient with cardiac history presents with chest discomfort.  In the emergency department today he has a nonischemic EKG with 3 negative troponins.  He is pain-free.  Patient's pain is atypical, however previous MI presented atypically.  Discussed follow-up plan with patient and he would like discharge and seems reliable to follow-up if his symptoms return, persist, or change.  Low concern for PE today given clinical exam, normal heart rate and pulse ox.  Low concern for dissection.  Feel patient stable for discharge at this time with follow-up plan and return instructions as above.    Final Clinical Impression(s) / ED Diagnoses Final diagnoses:  Precordial pain  Essential hypertension    Rx / DC Orders ED Discharge Orders    None       Carlisle Cater, PA-C 07/29/19 [place], Ankit, MD 08/06/19 [phone]

## 2019-07-29 NOTE — ED Notes (Signed)
Patient verbalizes understanding of discharge instructions. Opportunity for questioning and answers were provided. Armband removed by staff, pt discharged from ED to home 

## 2019-08-04 ENCOUNTER — Encounter: Payer: Self-pay | Admitting: Cardiology

## 2019-08-04 ENCOUNTER — Ambulatory Visit (INDEPENDENT_AMBULATORY_CARE_PROVIDER_SITE_OTHER): Payer: Medicare Other | Admitting: Cardiology

## 2019-08-04 ENCOUNTER — Other Ambulatory Visit: Payer: Self-pay

## 2019-08-04 VITALS — BP 140/60 | HR 76 | Ht 73.0 in | Wt 214.0 lb

## 2019-08-04 DIAGNOSIS — K219 Gastro-esophageal reflux disease without esophagitis: Secondary | ICD-10-CM | POA: Diagnosis not present

## 2019-08-04 DIAGNOSIS — I10 Essential (primary) hypertension: Secondary | ICD-10-CM

## 2019-08-04 DIAGNOSIS — I209 Angina pectoris, unspecified: Secondary | ICD-10-CM | POA: Diagnosis not present

## 2019-08-04 NOTE — Patient Instructions (Signed)

## 2019-08-04 NOTE — Progress Notes (Signed)
Cardiology Office Note:    Date:  08/04/2019   ID:  Collin Morse, DOB 04-Jul-1950, MRN [id number]  PCP:  Marda Stalker, PA-C  Cardiologist:  Candee Furbish, MD  Electrophysiologist:  None   Referring MD: Marda Stalker, PA-C     History of Present Illness:    Collin Morse is a 69 y.o. male here for the follow-up of coronary artery disease.  Prior CABG 2011 LIMA to LAD SVG to circumflex.  Has hypertension hyperlipidemia.  Prior symptoms with throat tightness and palpitations.  This is sometimes a seasonal type event in the summertime when the weather is hot he feels the symptoms.  Had nuclear stress test 2013 and 2016 that were both low risk.  Had ER visit chest pain see below for details.  [name] his wife Father died age 89.  Past Medical History:  Diagnosis Date  . Coronary artery disease 2011  . Coronary atherosclerosis of native coronary artery 12/18/2012   CABG LIMA to LAD, SVG to circumflex 01/2009  . Dizziness and giddiness   . GERD (gastroesophageal reflux disease)   . Hernia   . HTN (hypertension) since 2008  . Hyperlipemia   . Hypothyroidism   . Prostate cancer [facility])     Past Surgical History:  Procedure Laterality Date  . CATARACT EXTRACTION     Bilateral  . CORONARY ARTERY BYPASS GRAFT  2011  . GAS INSERTION Right 04/24/2016   Procedure: INSERTION OF GAS- SF6;  Surgeon: Jalene Mullet, MD;  Location: [place];  Service: Ophthalmology;  Laterality: Right;  . LASER PHOTO ABLATION Right 04/24/2016   Procedure: LASER PHOTO ABLATION-ENDOLASER;  Surgeon: Jalene Mullet, MD;  Location: [place];  Service: Ophthalmology;  Laterality: Right;  . PARS PLANA VITRECTOMY Right 04/24/2016   Procedure: PARS PLANA VITRECTOMY WITH 25 GAUGE;  Surgeon: Jalene Mullet, MD;  Location: [place];  Service: Ophthalmology;  Laterality: Right;  . PROSTATE SURGERY  2006    Current Medications: Current Meds  Medication Sig  . aspirin 81 MG tablet Take 81 mg by mouth daily.  . clobetasol  (OLUX) 0.05 % topical foam Apply topically 2 (two) times daily.  . clopidogrel (PLAVIX) 75 MG tablet TAKE 1 TAB DAILY. PT TAKES MED IN THE PLACE OF ASPRIN WHEN HE GETS HIS ALLERGY SHOT IN [place].  Marland Kitchen ezetimibe (ZETIA) 10 MG tablet TAKE 1 TABLET BY MOUTH EVERY DAY  . metoprolol succinate (TOPROL-XL) 50 MG 24 hr tablet TAKE 1/2 TABLET BY MOUTH IN THE MORNING AND TAKE 1 TABLET BY MOUTH IN THE EVENING.  . Multiple Vitamins-Minerals (MULTIVITAMIN PO) Take 1 tablet by mouth daily.   . nitroGLYCERIN (NITROSTAT) 0.4 MG SL tablet PLACE 1 TABLET (0.4 MG TOTAL) UNDER THE TONGUE EVERY 5 (FIVE) MINUTES AS NEEDED FOR CHEST PAIN.  Marland Kitchen omeprazole (PRILOSEC) 40 MG capsule Take 40 mg by mouth every other day.   . rosuvastatin (CRESTOR) 40 MG tablet TAKE 1 TABLET BY MOUTH EVERY DAY  . vardenafil (LEVITRA) 20 MG tablet Take 20 mg by mouth daily as needed for erectile dysfunction.     Allergies:   Sulfa antibiotics and Tetracyclines & related   Social History   Socioeconomic History  . Marital status: Married    Spouse name: [name]  . Number of children: 3  . Years of education: college  . Highest education level: Not on file  Occupational History  . Occupation: New Environmental education officer: [employer]  Tobacco Use  . Smoking status: Never  Smoker  . Smokeless tobacco: Never Used  Vaping Use  . Vaping Use: Never used  Substance and Sexual Activity  . Alcohol use: Yes    Alcohol/week: 2.0 standard drinks    Types: 2 Cans of beer per week    Comment: OCC  . Drug use: No  . Sexual activity: Not on file  Other Topics Concern  . Not on file  Social History Narrative   Patient works for [employer], Teacher, English as a foreign language, Patient lives at home with his wife [name]). College education. Right handed. 3 children   Caffeine- two glasses daily ice tea.   Social Determinants of Health   Financial Resource Strain:   . Difficulty of Paying Living Expenses:   Food Insecurity:   . Worried About  Charity fundraiser in the Last Year:   . Arboriculturist in the Last Year:   Transportation Needs:   . Film/video editor (Medical):   Marland Kitchen Lack of Transportation (Non-Medical):   Physical Activity:   . Days of Exercise per Week:   . Minutes of Exercise per Session:   Stress:   . Feeling of Stress :   Social Connections:   . Frequency of Communication with Friends and Family:   . Frequency of Social Gatherings with Friends and Family:   . Attends Religious Services:   . Active Member of Clubs or Organizations:   . Attends Archivist Meetings:   Marland Kitchen Marital Status:      Family History: The patient's family history includes Dementia in his mother; Diabetes type II in his father and mother; Heart Problems in his father; High blood pressure in his mother; Lung cancer in his mother.  ROS:   Please see the history of present illness.     All other systems reviewed and are negative.  EKGs/Labs/Other Studies Reviewed:    The following studies were reviewed today: 48-hour Holter monitor previously-no adverse arrhythmias  EKG:  EKG is  ordered today.  The ekg ordered today demonstrates sinus bradycardia 58 with nonspecific ST-T wave changes no change from prior prior EKG 03/11/2018 showed sinus bradycardia 58 nonspecific ST-T wave changes.  Recent Labs: 03/12/2019: ALT 47 07/29/2019: BUN 21; Creatinine, Ser 1.42; Hemoglobin 15.2; Platelets 217; Potassium 3.7; Sodium 137  Recent Lipid Panel    Component Value Date/Time   CHOL 134 03/12/2019 0908   TRIG 282 (H) 03/12/2019 0908   HDL 32 (L) 03/12/2019 0908   CHOLHDL 4.2 03/12/2019 0908   CHOLHDL 3 07/16/2014 0756   VLDL 18.4 07/16/2014 0756   LDLCALC 58 03/12/2019 0908    Physical Exam:    VS:  BP (!) 140/60   Pulse 76   Ht 6\' 1"  (1.854 m)   Wt (!) 214 lb (97.1 kg)   SpO2 96%   BMI 28.23 kg/m     Wt Readings from Last 3 Encounters:  08/04/19 (!) 214 lb (97.1 kg)  03/12/19 226 lb (102.5 kg)  03/11/18 225 lb 12.8 oz  (102.4 kg)    GEN: Well nourished, well developed, in no acute distress  HEENT: normal  Neck: no JVD, carotid bruits, or masses Cardiac: RRR; no murmurs, rubs, or gallops,no edema  Respiratory:  clear to auscultation bilaterally, normal work of breathing GI: soft, nontender, nondistended, + BS MS: no deformity or atrophy  Skin: warm and dry, no rash Neuro:  Alert and Oriented x 3, Strength and sensation are intact Psych: euthymic mood, full affect   ASSESSMENT:  1. Angina pectoris ([place])   2. Gastroesophageal reflux disease without esophagitis   3. Essential hypertension    PLAN:    In order of problems listed above:  Chest pain -Likely GERD related.  3 troponins were normal in the emergency room.  EKG unchanged with nonspecific ST-T wave changes personally reviewed from outside source.  We will continue to monitor.  Have low threshold for further stress testing if necessary.  Obviously if exertional pain were to present itself, we may proceed with heart catheterization.  For now continue with surveillance.  He will let us know how he feels.  Coronary artery disease status post CABG -Previously changed timing of metoprolol taking 50 mg at bedtime and 25 mg in the morning, felt better with this.  Prior stress test 2 of which were in 2013 in 2016 were both low risk.  Enjoys golf. -Amlodipine 5 mg as needed was also given for potential esophageal spasm.  I was worried about him taking it daily because of low blood pressures at times.  We will check a CBC. -Only takes Plavix during his week of seasonal allergy shots.  Hyperlipidemia -LDL goal less than 70.  In the past it has been 67 from outside labs.  He is also on Zetia 10 mg, Crestor 40.  No changes.  Excellent lipids.  LDL 58  Palpitations -Hopefully metoprolol continues to help.  50 mg evening 25 mg a.m.  Good description of compensatory pause.  Essential hypertension -Has had low blood pressure in the past.  Symptoms  exacerbated when bending over to grab a golf ball for instance at times.  Blood pressure is a little bit elevated today but usually it is normal.  Continue to monitor.  At times, he can actually have elevated blood pressure, likely whitecoat component.  I have asked him to monitor this at home.  Sometimes anxiety plays a role in elevation.   Prostate cancer -Prior prostatectomy 2005.  Follow[facility] at [facility].  Cialis long-acting, knows not to take nitroglycerin we discussed.  Note from [facility] reviewed.  PSA has been low.  0.02.    Medication Adjustments/Labs and Tests Ordered: Current medicines are reviewed at length with the patient today.  Concerns regarding medicines are outlined above.  No orders of the defined types were placed in this encounter.  No orders of the defined types were placed in this encounter.   Patient Instructions  Medication Instructions:  The current medical regimen is effective;  continue present plan and medications.  *If you need a refill on your cardiac medications before your next appointment, please call your pharmacy*  Follow-Up: At [facility], you and your health needs are our priority.  As part of our continuing mission to provide you with exceptional heart care, we have created designated Provider Care Teams.  These Care Teams include your primary Cardiologist (physician) and Advanced Practice Providers (APPs -  Physician Assistants and Nurse Practitioners) who all work together to provide you with the care you need, when you need it.  We recommend signing up for the patient portal called "MyChart".  Sign up information is provided on this After Visit Summary.  MyChart is used to connect with patients for Virtual Visits (Telemedicine).  Patients are able to view lab/test results, encounter notes, upcoming appointments, etc.  Non-urgent messages can be sent to your provider as well.   To learn more about what you can do with MyChart, go to  [email or web address].    Your next appointment:  6 month(s)  The format for your next appointment:   In Person  Provider:   Candee Furbish, MD   Thank you for choosing [facility]!!        Signed, Candee Furbish, MD  08/04/2019 5:32 PM    [place]

## 2019-09-04 DIAGNOSIS — M542 Cervicalgia: Secondary | ICD-10-CM | POA: Diagnosis not present

## 2019-09-17 DIAGNOSIS — M542 Cervicalgia: Secondary | ICD-10-CM | POA: Diagnosis not present

## 2019-09-27 ENCOUNTER — Other Ambulatory Visit: Payer: Self-pay | Admitting: Cardiology

## 2019-10-01 DIAGNOSIS — M542 Cervicalgia: Secondary | ICD-10-CM | POA: Diagnosis not present

## 2019-10-03 DIAGNOSIS — H40013 Open angle with borderline findings, low risk, bilateral: Secondary | ICD-10-CM | POA: Diagnosis not present

## 2019-10-03 DIAGNOSIS — H43821 Vitreomacular adhesion, right eye: Secondary | ICD-10-CM | POA: Diagnosis not present

## 2019-10-03 DIAGNOSIS — Z961 Presence of intraocular lens: Secondary | ICD-10-CM | POA: Diagnosis not present

## 2019-10-03 DIAGNOSIS — H43813 Vitreous degeneration, bilateral: Secondary | ICD-10-CM | POA: Diagnosis not present

## 2019-10-03 DIAGNOSIS — H35372 Puckering of macula, left eye: Secondary | ICD-10-CM | POA: Diagnosis not present

## 2019-10-14 DIAGNOSIS — Z23 Encounter for immunization: Secondary | ICD-10-CM | POA: Diagnosis not present

## 2019-10-15 DIAGNOSIS — M542 Cervicalgia: Secondary | ICD-10-CM | POA: Diagnosis not present

## 2019-10-29 DIAGNOSIS — Z01812 Encounter for preprocedural laboratory examination: Secondary | ICD-10-CM | POA: Diagnosis not present

## 2019-10-30 DIAGNOSIS — K648 Other hemorrhoids: Secondary | ICD-10-CM | POA: Diagnosis not present

## 2019-10-30 DIAGNOSIS — K317 Polyp of stomach and duodenum: Secondary | ICD-10-CM | POA: Diagnosis not present

## 2019-10-30 DIAGNOSIS — K5730 Diverticulosis of large intestine without perforation or abscess without bleeding: Secondary | ICD-10-CM | POA: Diagnosis not present

## 2019-10-30 DIAGNOSIS — K2289 Other specified disease of esophagus: Secondary | ICD-10-CM | POA: Diagnosis not present

## 2019-10-30 DIAGNOSIS — D120 Benign neoplasm of cecum: Secondary | ICD-10-CM | POA: Diagnosis not present

## 2019-10-30 DIAGNOSIS — K219 Gastro-esophageal reflux disease without esophagitis: Secondary | ICD-10-CM | POA: Diagnosis not present

## 2019-10-30 DIAGNOSIS — K449 Diaphragmatic hernia without obstruction or gangrene: Secondary | ICD-10-CM | POA: Diagnosis not present

## 2019-10-30 DIAGNOSIS — R12 Heartburn: Secondary | ICD-10-CM | POA: Diagnosis not present

## 2019-10-30 DIAGNOSIS — Z86010 Personal history of colonic polyps: Secondary | ICD-10-CM | POA: Diagnosis not present

## 2019-11-04 DIAGNOSIS — Z23 Encounter for immunization: Secondary | ICD-10-CM | POA: Diagnosis not present

## 2019-11-05 DIAGNOSIS — K219 Gastro-esophageal reflux disease without esophagitis: Secondary | ICD-10-CM | POA: Diagnosis not present

## 2019-11-05 DIAGNOSIS — K317 Polyp of stomach and duodenum: Secondary | ICD-10-CM | POA: Diagnosis not present

## 2019-11-05 DIAGNOSIS — M542 Cervicalgia: Secondary | ICD-10-CM | POA: Diagnosis not present

## 2019-11-05 DIAGNOSIS — D120 Benign neoplasm of cecum: Secondary | ICD-10-CM | POA: Diagnosis not present

## 2019-11-25 DIAGNOSIS — M542 Cervicalgia: Secondary | ICD-10-CM | POA: Diagnosis not present

## 2019-12-08 DIAGNOSIS — M542 Cervicalgia: Secondary | ICD-10-CM | POA: Diagnosis not present

## 2019-12-09 DIAGNOSIS — R21 Rash and other nonspecific skin eruption: Secondary | ICD-10-CM | POA: Diagnosis not present

## 2019-12-09 DIAGNOSIS — Z0000 Encounter for general adult medical examination without abnormal findings: Secondary | ICD-10-CM | POA: Diagnosis not present

## 2019-12-09 DIAGNOSIS — Z23 Encounter for immunization: Secondary | ICD-10-CM | POA: Diagnosis not present

## 2019-12-09 DIAGNOSIS — Z1159 Encounter for screening for other viral diseases: Secondary | ICD-10-CM | POA: Diagnosis not present

## 2019-12-09 DIAGNOSIS — I10 Essential (primary) hypertension: Secondary | ICD-10-CM | POA: Diagnosis not present

## 2019-12-09 DIAGNOSIS — E782 Mixed hyperlipidemia: Secondary | ICD-10-CM | POA: Diagnosis not present

## 2019-12-09 DIAGNOSIS — R739 Hyperglycemia, unspecified: Secondary | ICD-10-CM | POA: Diagnosis not present

## 2019-12-09 DIAGNOSIS — Z1211 Encounter for screening for malignant neoplasm of colon: Secondary | ICD-10-CM | POA: Diagnosis not present

## 2019-12-15 ENCOUNTER — Ambulatory Visit (INDEPENDENT_AMBULATORY_CARE_PROVIDER_SITE_OTHER): Payer: Medicare Other | Admitting: Dermatology

## 2019-12-15 ENCOUNTER — Other Ambulatory Visit: Payer: Self-pay

## 2019-12-15 ENCOUNTER — Encounter: Payer: Self-pay | Admitting: Dermatology

## 2019-12-15 DIAGNOSIS — L400 Psoriasis vulgaris: Secondary | ICD-10-CM | POA: Diagnosis not present

## 2019-12-15 DIAGNOSIS — L570 Actinic keratosis: Secondary | ICD-10-CM

## 2019-12-15 DIAGNOSIS — D045 Carcinoma in situ of skin of trunk: Secondary | ICD-10-CM

## 2019-12-15 DIAGNOSIS — Z85828 Personal history of other malignant neoplasm of skin: Secondary | ICD-10-CM | POA: Diagnosis not present

## 2019-12-15 DIAGNOSIS — Z86006 Personal history of melanoma in-situ: Secondary | ICD-10-CM | POA: Diagnosis not present

## 2019-12-15 DIAGNOSIS — I209 Angina pectoris, unspecified: Secondary | ICD-10-CM | POA: Diagnosis not present

## 2019-12-15 DIAGNOSIS — D485 Neoplasm of uncertain behavior of skin: Secondary | ICD-10-CM

## 2019-12-15 DIAGNOSIS — Z87898 Personal history of other specified conditions: Secondary | ICD-10-CM

## 2019-12-15 DIAGNOSIS — D1801 Hemangioma of skin and subcutaneous tissue: Secondary | ICD-10-CM | POA: Diagnosis not present

## 2019-12-15 NOTE — Patient Instructions (Signed)

## 2019-12-18 ENCOUNTER — Telehealth: Payer: Self-pay

## 2019-12-18 NOTE — Telephone Encounter (Signed)
-----   Message from Lavonna Monarch, MD sent at 12/18/2019  6:32 AM EST ----- Schedule surgery with Dr. Darene Lamer (unless note indicates base treated at time of biopsy).

## 2019-12-18 NOTE — Telephone Encounter (Signed)
Phone call to patient with his pathology results. Patient aware of results.  

## 2019-12-20 ENCOUNTER — Encounter: Payer: Self-pay | Admitting: Dermatology

## 2019-12-20 NOTE — Progress Notes (Signed)
Follow-Up Visit   Subjective  Collin Morse is a 69 y.o. male who presents for the following: Psoriasis (Patient here today for psoriasis follow up.  Per patient his chest has improved but there is one spot that's not clearing.  Also the patient's scalp isn't clearing as quickly, he no longer has the itching on his scalp.  Check additional place on chestbone x  over 1 year does swell up and painful if popped. ).  General skin check Location: Rash on scalp, growth on chest. 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 Objective  Neck - Posterior: 2007-mis treated with mohs.  No sign recurrence or regional adenopathy.  Objective  Right Lower Back, Right Upper Back: Superficial bcc.  No sign recurrence  Objective  Right Shoulder - Posterior: Slight-moderate; no sign recurrence  Objective  Left Breast (2), Umbilicus: Multiple 1 to 3 mm smooth red dots.  Objective  Right Buccal Cheek: Four mm pink hornlike crust  Objective  Right Chest: 8 mm thick white crust, rule out superficial SCCA.  1.7cm to angioma       Objective  Mid Occipital Scalp: Psoriasiform dermatitis present over the summer has largely cleared.  I wonder if this was a transient intermittent related dermatosis and not true psoriasis.  He can use the clobetasol on a as needed basis   A full examination was performed including scalp, head, eyes, ears, nose, lips, neck, chest, axillae, abdomen, back, buttocks, bilateral upper extremities, bilateral lower extremities, hands, feet, fingers, toes, fingernails, and toenails. All findings within normal limits unless otherwise noted below.   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  Self  examined twice annually.  History of basal cell carcinoma (BCC) of skin (2) Right Upper Back; Right Lower Back  Yearly skin exams   History of atypical nevus Right Shoulder - Posterior  Yearly skin exam.  Hemangioma of skin (3) Left Breast (2); Umbilicus  Benign okay to leave.  AK (actinic keratosis) Right Buccal Cheek  Destruction of lesion - Right Buccal Cheek Complexity: simple   Destruction method: cryotherapy   Informed consent: discussed and consent obtained   Timeout:  patient name, date of birth, surgical site, and procedure verified Lesion destroyed using liquid nitrogen: Yes   Cryotherapy cycles:  5 Outcome: patient tolerated procedure well with no complications   Post-procedure details: wound care instructions given    Neoplasm of uncertain behavior of skin Right Chest  Skin / nail biopsy Type of biopsy: tangential   Informed consent: discussed and consent obtained   Timeout: patient name, date of birth, surgical site, and procedure verified   Procedure prep:  Patient was prepped and draped in usual sterile fashion (Non sterile) Prep type:  Chlorhexidine Anesthesia: the lesion was anesthetized in a standard fashion   Anesthetic:  1% lidocaine w/ epinephrine 1-100,000 local infiltration Instrument used: flexible razor blade   Outcome: patient tolerated procedure well   Post-procedure details: sterile dressing applied and wound care instructions given   Dressing type: petrolatum    Specimen 1 - Surgical pathology Differential Diagnosis: R/O BCC vs SCC Check Margins: No  Plaque psoriasis Mid Occipital Scalp  Continue clobetasol on a as needed basis; avoid use on face and body folds.  Return if rash flares.     I, Lavonna Monarch, MD, have reviewed all documentation for this visit.  The documentation on 12/20/19 for the exam, diagnosis, procedures, and orders are all accurate and complete.

## 2019-12-29 DIAGNOSIS — M542 Cervicalgia: Secondary | ICD-10-CM | POA: Diagnosis not present

## 2020-01-12 DIAGNOSIS — M542 Cervicalgia: Secondary | ICD-10-CM | POA: Diagnosis not present

## 2020-02-03 DIAGNOSIS — M542 Cervicalgia: Secondary | ICD-10-CM | POA: Diagnosis not present

## 2020-02-06 DIAGNOSIS — C61 Malignant neoplasm of prostate: Secondary | ICD-10-CM | POA: Diagnosis not present

## 2020-02-12 ENCOUNTER — Encounter: Payer: Self-pay | Admitting: Dermatology

## 2020-02-12 ENCOUNTER — Other Ambulatory Visit: Payer: Self-pay

## 2020-02-12 ENCOUNTER — Ambulatory Visit (INDEPENDENT_AMBULATORY_CARE_PROVIDER_SITE_OTHER): Payer: Medicare Other | Admitting: Dermatology

## 2020-02-12 DIAGNOSIS — D045 Carcinoma in situ of skin of trunk: Secondary | ICD-10-CM

## 2020-02-12 DIAGNOSIS — C4492 Squamous cell carcinoma of skin, unspecified: Secondary | ICD-10-CM

## 2020-02-12 NOTE — Patient Instructions (Signed)

## 2020-02-17 DIAGNOSIS — M542 Cervicalgia: Secondary | ICD-10-CM | POA: Diagnosis not present

## 2020-02-21 ENCOUNTER — Encounter: Payer: Self-pay | Admitting: Dermatology

## 2020-02-21 NOTE — Progress Notes (Signed)
   Follow-Up Visit   Subjective  Collin Morse is a 70 y.o. male who presents for the following: Procedure (Here for treatment- right chest- cis x 1).  CIS Location: Chest Duration:  Quality:  Associated Signs/Symptoms: Modifying Factors:  Severity:  Timing: Context: For treatment  Objective  Well appearing patient in no apparent distress; mood and affect are within normal limits. Objective  Right Chest: Lesion identified by Dr.Channel Papandrea and nurse in room.      All skin waist up examined.   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3 Hemostasis achieved with:  ferric subsulfate Outcome: patient tolerated procedure well with no complications   Post-procedure details: sterile dressing applied and wound care instructions given   Dressing type: bandage and petrolatum   Additional details:  Wound innoculated with 5 fluorouracil solution.      I, Lavonna Monarch, MD, have reviewed all documentation for this visit.  The documentation on 02/21/20 for the exam, diagnosis, procedures, and orders are all accurate and complete.

## 2020-03-02 DIAGNOSIS — M542 Cervicalgia: Secondary | ICD-10-CM | POA: Diagnosis not present

## 2020-03-03 ENCOUNTER — Other Ambulatory Visit: Payer: Self-pay | Admitting: Cardiology

## 2020-03-11 ENCOUNTER — Other Ambulatory Visit: Payer: Self-pay

## 2020-03-11 ENCOUNTER — Ambulatory Visit (INDEPENDENT_AMBULATORY_CARE_PROVIDER_SITE_OTHER): Payer: Medicare Other | Admitting: Cardiology

## 2020-03-11 ENCOUNTER — Encounter: Payer: Self-pay | Admitting: Cardiology

## 2020-03-11 VITALS — BP 160/80 | HR 74 | Ht 73.0 in | Wt 223.0 lb

## 2020-03-11 DIAGNOSIS — I10 Essential (primary) hypertension: Secondary | ICD-10-CM | POA: Diagnosis not present

## 2020-03-11 DIAGNOSIS — E785 Hyperlipidemia, unspecified: Secondary | ICD-10-CM

## 2020-03-11 DIAGNOSIS — I2510 Atherosclerotic heart disease of native coronary artery without angina pectoris: Secondary | ICD-10-CM | POA: Diagnosis not present

## 2020-03-11 NOTE — Patient Instructions (Signed)
Medication Instructions:  Your physician recommends that you continue on your current medications as directed. Please refer to the Current Medication list given to you today. *If you need a refill on your cardiac medications before your next appointment, please call your pharmacy*   Follow-Up: At [facility], you and your health needs are our priority.  As part of our continuing mission to provide you with exceptional heart care, we have created designated Provider Care Teams.  These Care Teams include your primary Cardiologist (physician) and Advanced Practice Providers (APPs -  Physician Assistants and Nurse Practitioners) who all work together to provide you with the care you need, when you need it.    Your next appointment:   12 month(s)  The format for your next appointment:   In Person  Provider:   You may see Candee Furbish, MD or one of the following Advanced Practice Providers on your designated Care Team:    Kathyrn Drown, NP

## 2020-03-11 NOTE — Progress Notes (Signed)
Cardiology Office Note:    Date:  03/11/2020   ID:  Collin Morse, DOB February 01, 1950, MRN [id number]  PCP:  [place], [place], [place]  Cardiologist:  Candee Furbish, MD  Advanced Practice Provider:  No care team member to display Electrophysiologist:  None      Referring MD: Marda Stalker, PA-C     History of Present Illness:    Collin Morse is a 70 y.o. male here for the follow-up of coronary artery disease prior CABG 2011 with LIMA to LAD, SVG to circumflex.  Also has hypertension hyperlipidemia.  Nuclear stress test performed in 2013 and 2016 were both low risk.  Occasionally will feel a throat tightness and palpitation, describes it as a seasonal type event in the summertime when the weather is hot.  Overall he is feeling quite well.  Has not had his notch exercise as he wants to have.  Has not been going to the gym as much.  He is looking forward to a trip to [place].  Golf trip.  Currently not having any anginal symptoms. We take care of Collin Morse his wife. Just had knee done.   Past Medical History:  Diagnosis Date  . Coronary artery disease 2011  . Coronary atherosclerosis of native coronary artery 12/18/2012   CABG LIMA to LAD, SVG to circumflex 01/2009  . Dizziness and giddiness   . GERD (gastroesophageal reflux disease)   . Hernia   . HTN (hypertension) since 2008  . Hyperlipemia   . Hypothyroidism   . Prostate cancer [facility])     Past Surgical History:  Procedure Laterality Date  . CATARACT EXTRACTION     Bilateral  . CORONARY ARTERY BYPASS GRAFT  2011  . GAS INSERTION Right 04/24/2016   Procedure: INSERTION OF GAS- SF6;  Surgeon: Jalene Mullet, MD;  Location: [place];  Service: Ophthalmology;  Laterality: Right;  . LASER PHOTO ABLATION Right 04/24/2016   Procedure: LASER PHOTO ABLATION-ENDOLASER;  Surgeon: Jalene Mullet, MD;  Location: [place];  Service: Ophthalmology;  Laterality: Right;  . PARS PLANA VITRECTOMY Right  04/24/2016   Procedure: PARS PLANA VITRECTOMY WITH 25 GAUGE;  Surgeon: Jalene Mullet, MD;  Location: [place];  Service: Ophthalmology;  Laterality: Right;  . PROSTATE SURGERY  2006    Current Medications: Current Meds  Medication Sig  . aspirin 81 MG tablet Take 81 mg by mouth daily.  . clobetasol (OLUX) 0.05 % topical foam Apply topically 2 (two) times daily.  . clopidogrel (PLAVIX) 75 MG tablet TAKE 1 TAB DAILY. PT TAKES MED IN THE PLACE OF ASPRIN WHEN HE GETS HIS ALLERGY SHOT IN [place].  . [employer] GENTLE LAXATIVE 5 MG EC tablet Take by mouth.  . ezetimibe (ZETIA) 10 MG tablet TAKE 1 TABLET BY MOUTH EVERY DAY  . GAVILYTE-C 240 g solution Take by mouth.  . metFORMIN (GLUCOPHAGE) 500 MG tablet Take 500 mg by mouth daily.  . metoprolol succinate (TOPROL-XL) 50 MG 24 hr tablet TAKE 1/2 TABLET BY MOUTH IN THE MORNING AND TAKE 1 TABLET BY MOUTH IN THE EVENING.  . Multiple Vitamins-Minerals (MULTIVITAMIN PO) Take 1 tablet by mouth daily.   . nitroGLYCERIN (NITROSTAT) 0.4 MG SL tablet PLACE 1 TABLET (0.4 MG TOTAL) UNDER THE TONGUE EVERY 5 (FIVE) MINUTES AS NEEDED FOR CHEST PAIN.  Marland Kitchen omeprazole (PRILOSEC) 20 MG capsule Take 20 mg by mouth daily.  . rosuvastatin (CRESTOR) 40 MG tablet TAKE 1 TABLET BY MOUTH EVERY DAY  . vardenafil (  LEVITRA) 20 MG tablet Take 20 mg by mouth daily as needed for erectile dysfunction.     Allergies:   Sulfa antibiotics and Tetracyclines & related   Social History   Socioeconomic History  . Marital status: Married    Spouse name: Collin Morse  . Number of children: 3  . Years of education: college  . Highest education level: Not on file  Occupational History  . Occupation: New Environmental education officer: [employer]  Tobacco Use  . Smoking status: Never Smoker  . Smokeless tobacco: Never Used  Vaping Use  . Vaping Use: Never used  Substance and Sexual Activity  . Alcohol use: Yes    Alcohol/week: 2.0 standard drinks    Types: 2 Cans of beer per week     Comment: OCC  . Drug use: No  . Sexual activity: Not on file  Other Topics Concern  . Not on file  Social History Narrative   Patient works for [employer], Teacher, English as a foreign language, Patient lives at home with his wife Collin Morse). College education. Right handed. 3 children   Caffeine- two glasses daily ice tea.   Social Determinants of Health   Financial Resource Strain: Not on file  Food Insecurity: Not on file  Transportation Needs: Not on file  Physical Activity: Not on file  Stress: Not on file  Social Connections: Not on file     Family History: The patient's family history includes Dementia in his mother; Diabetes type II in his father and mother; Heart Problems in his father; High blood pressure in his mother; Lung cancer in his mother.  ROS:   Please see the history of present illness.     All other systems reviewed and are negative.  EKGs/Labs/Other Studies Reviewed:    The following studies were reviewed today:   ecent Labs: 07/29/2019: BUN 21; Creatinine, Ser 1.42; Hemoglobin 15.2; Platelets 217; Potassium 3.7; Sodium 137  Recent Lipid Panel    Component Value Date/Time   CHOL 134 03/12/2019 0908   TRIG 282 (H) 03/12/2019 0908   HDL 32 (L) 03/12/2019 0908   CHOLHDL 4.2 03/12/2019 0908   CHOLHDL 3 07/16/2014 0756   VLDL 18.4 07/16/2014 0756   LDLCALC 58 03/12/2019 0908     Risk Assessment/Calculations:      Physical Exam:    VS:  BP (!) 160/80 (BP Location: Left Arm, Patient Position: Sitting, Cuff Size: Normal)   Pulse 74   Ht 6\' 1"  (1.854 m)   Wt 223 lb (101.2 kg)   SpO2 96%   BMI 29.42 kg/m     Wt Readings from Last 3 Encounters:  03/11/20 223 lb (101.2 kg)  08/04/19 (!) 214 lb (97.1 kg)  03/12/19 226 lb (102.5 kg)     GEN:  Well nourished, well developed in no acute distress HEENT: Normal NECK: No JVD; No carotid bruits LYMPHATICS: No lymphadenopathy CARDIAC: RRR, no murmurs, rubs, gallops RESPIRATORY:  Clear to auscultation without rales,  wheezing or rhonchi  ABDOMEN: Soft, non-tender, non-distended MUSCULOSKELETAL:  No edema; No deformity  SKIN: Warm and dry NEUROLOGIC:  Alert and oriented x 3 PSYCHIATRIC:  Normal affect   ASSESSMENT:    1. Atherosclerosis of native coronary artery of native heart without angina pectoris   2. Essential hypertension   3. Hyperlipidemia, unspecified hyperlipidemia type    PLAN:    In order of problems listed above:  Coronary artery disease status post CABG -Continue with goal-directed medical therapy.  Stable. -Prior chest  pain possibly GERD type related. -Amlodipine 5 mg was previously added for potential esophageal spasm.  This was stopped because of some relative hypotension. -Only takes Plavix the week of his seasonal allergy shots.  Hyperlipidemia -LDL goal less than 70.  On Zetia and Crestor.  No changes.  Essential hypertension -Previous hypotension had been noted for instance when grabbing his golf ball felt as though he was going to pass out.  Blood pressure is elevated today.  Continue to monitor at home.  Hopefully daily exercise will continue to help.  Prostate cancer -Prostatectomy in 2005.     Medication Adjustments/Labs and Tests Ordered: Current medicines are reviewed at length with the patient today.  Concerns regarding medicines are outlined above.  No orders of the defined types were placed in this encounter.  No orders of the defined types were placed in this encounter.   Patient Instructions  Medication Instructions:  Your physician recommends that you continue on your current medications as directed. Please refer to the Current Medication list given to you today. *If you need a refill on your cardiac medications before your next appointment, please call your pharmacy*   Follow-Up: At [facility], you and your health needs are our priority.  As part of our continuing mission to provide you with exceptional heart care, we have created designated  Provider Care Teams.  These Care Teams include your primary Cardiologist (physician) and Advanced Practice Providers (APPs -  Physician Assistants and Nurse Practitioners) who all work together to provide you with the care you need, when you need it.    Your next appointment:   12 month(s)  The format for your next appointment:   In Person  Provider:   You may see Candee Furbish, MD or one of the following Advanced Practice Providers on your designated Care Team:    Kathyrn Drown, NP         Signed, Candee Furbish, MD  03/11/2020 3:17 PM    [place]

## 2020-03-16 DIAGNOSIS — M542 Cervicalgia: Secondary | ICD-10-CM | POA: Diagnosis not present

## 2020-03-19 ENCOUNTER — Other Ambulatory Visit: Payer: Self-pay | Admitting: Cardiology

## 2020-03-19 DIAGNOSIS — C61 Malignant neoplasm of prostate: Secondary | ICD-10-CM | POA: Diagnosis not present

## 2020-03-19 DIAGNOSIS — N5231 Erectile dysfunction following radical prostatectomy: Secondary | ICD-10-CM | POA: Diagnosis not present

## 2020-03-20 ENCOUNTER — Other Ambulatory Visit: Payer: Self-pay | Admitting: Cardiology

## 2020-03-30 DIAGNOSIS — M542 Cervicalgia: Secondary | ICD-10-CM | POA: Diagnosis not present

## 2020-04-02 ENCOUNTER — Other Ambulatory Visit: Payer: Self-pay | Admitting: Cardiology

## 2020-04-13 DIAGNOSIS — M542 Cervicalgia: Secondary | ICD-10-CM | POA: Diagnosis not present

## 2020-05-04 DIAGNOSIS — M542 Cervicalgia: Secondary | ICD-10-CM | POA: Diagnosis not present

## 2020-05-25 DIAGNOSIS — M542 Cervicalgia: Secondary | ICD-10-CM | POA: Diagnosis not present

## 2020-05-31 DIAGNOSIS — I10 Essential (primary) hypertension: Secondary | ICD-10-CM | POA: Diagnosis not present

## 2020-05-31 DIAGNOSIS — E1159 Type 2 diabetes mellitus with other circulatory complications: Secondary | ICD-10-CM | POA: Diagnosis not present

## 2020-05-31 DIAGNOSIS — R739 Hyperglycemia, unspecified: Secondary | ICD-10-CM | POA: Diagnosis not present

## 2020-05-31 DIAGNOSIS — I2510 Atherosclerotic heart disease of native coronary artery without angina pectoris: Secondary | ICD-10-CM | POA: Diagnosis not present

## 2020-05-31 DIAGNOSIS — Z0000 Encounter for general adult medical examination without abnormal findings: Secondary | ICD-10-CM | POA: Diagnosis not present

## 2020-05-31 DIAGNOSIS — E782 Mixed hyperlipidemia: Secondary | ICD-10-CM | POA: Diagnosis not present

## 2020-06-01 DIAGNOSIS — Z23 Encounter for immunization: Secondary | ICD-10-CM | POA: Diagnosis not present

## 2020-06-10 DIAGNOSIS — M542 Cervicalgia: Secondary | ICD-10-CM | POA: Diagnosis not present

## 2020-07-01 DIAGNOSIS — M542 Cervicalgia: Secondary | ICD-10-CM | POA: Diagnosis not present

## 2020-07-05 DIAGNOSIS — I10 Essential (primary) hypertension: Secondary | ICD-10-CM | POA: Diagnosis not present

## 2020-07-05 DIAGNOSIS — Z0000 Encounter for general adult medical examination without abnormal findings: Secondary | ICD-10-CM | POA: Diagnosis not present

## 2020-07-05 DIAGNOSIS — R739 Hyperglycemia, unspecified: Secondary | ICD-10-CM | POA: Diagnosis not present

## 2020-07-05 DIAGNOSIS — E782 Mixed hyperlipidemia: Secondary | ICD-10-CM | POA: Diagnosis not present

## 2020-07-20 DIAGNOSIS — M542 Cervicalgia: Secondary | ICD-10-CM | POA: Diagnosis not present

## 2020-08-11 ENCOUNTER — Ambulatory Visit: Payer: Medicare Other | Admitting: Dermatology

## 2020-08-18 DIAGNOSIS — M542 Cervicalgia: Secondary | ICD-10-CM | POA: Diagnosis not present

## 2020-09-08 DIAGNOSIS — M542 Cervicalgia: Secondary | ICD-10-CM | POA: Diagnosis not present

## 2020-09-21 DIAGNOSIS — M542 Cervicalgia: Secondary | ICD-10-CM | POA: Diagnosis not present

## 2020-10-07 ENCOUNTER — Telehealth: Payer: Self-pay | Admitting: Cardiology

## 2020-10-07 ENCOUNTER — Emergency Department (HOSPITAL_BASED_OUTPATIENT_CLINIC_OR_DEPARTMENT_OTHER)
Admission: EM | Admit: 2020-10-07 | Discharge: 2020-10-07 | Disposition: A | Discharge status: Home / Self Care | Payer: Medicare Other | Attending: Emergency Medicine | Admitting: Emergency Medicine

## 2020-10-07 ENCOUNTER — Encounter (HOSPITAL_BASED_OUTPATIENT_CLINIC_OR_DEPARTMENT_OTHER): Payer: Self-pay

## 2020-10-07 ENCOUNTER — Emergency Department (HOSPITAL_BASED_OUTPATIENT_CLINIC_OR_DEPARTMENT_OTHER): Payer: Medicare Other | Admitting: Radiology

## 2020-10-07 ENCOUNTER — Other Ambulatory Visit: Payer: Self-pay

## 2020-10-07 DIAGNOSIS — R0789 Other chest pain: Secondary | ICD-10-CM | POA: Diagnosis not present

## 2020-10-07 DIAGNOSIS — Z7982 Long term (current) use of aspirin: Secondary | ICD-10-CM | POA: Diagnosis not present

## 2020-10-07 DIAGNOSIS — I2510 Atherosclerotic heart disease of native coronary artery without angina pectoris: Secondary | ICD-10-CM | POA: Insufficient documentation

## 2020-10-07 DIAGNOSIS — E039 Hypothyroidism, unspecified: Secondary | ICD-10-CM | POA: Insufficient documentation

## 2020-10-07 DIAGNOSIS — Z951 Presence of aortocoronary bypass graft: Secondary | ICD-10-CM | POA: Diagnosis not present

## 2020-10-07 DIAGNOSIS — R079 Chest pain, unspecified: Secondary | ICD-10-CM | POA: Diagnosis not present

## 2020-10-07 DIAGNOSIS — Z8546 Personal history of malignant neoplasm of prostate: Secondary | ICD-10-CM | POA: Insufficient documentation

## 2020-10-07 DIAGNOSIS — Z7984 Long term (current) use of oral hypoglycemic drugs: Secondary | ICD-10-CM | POA: Insufficient documentation

## 2020-10-07 DIAGNOSIS — Z79899 Other long term (current) drug therapy: Secondary | ICD-10-CM | POA: Insufficient documentation

## 2020-10-07 DIAGNOSIS — R072 Precordial pain: Secondary | ICD-10-CM | POA: Insufficient documentation

## 2020-10-07 LAB — BASIC METABOLIC PANEL
Anion gap: 11 (ref 5–15)
BUN: 20 mg/dL (ref 8–23)
CO2: 23 mmol/L (ref 22–32)
Calcium: 8.9 mg/dL (ref 8.9–10.3)
Chloride: 105 mmol/L (ref 98–111)
Creatinine, Ser: 1.26 mg/dL — ABNORMAL HIGH (ref 0.61–1.24)
GFR, Estimated: >60 mL/min (ref >60)
Glucose, Bld: 112 mg/dL — ABNORMAL HIGH (ref 70–99)
Potassium: 3.7 mmol/L (ref 3.5–5.1)
Sodium: 139 mmol/L (ref 135–145)

## 2020-10-07 LAB — CBC
HCT: 42.6 % (ref 39.0–52.0)
Hemoglobin: 14.6 g/dL (ref 13.0–17.0)
MCH: 31.9 pg (ref 26.0–34.0)
MCHC: 34.3 g/dL (ref 30.0–36.0)
MCV: 93 fL (ref 80.0–100.0)
Platelets: 180 10*3/uL (ref 150–400)
RBC: 4.58 MIL/uL (ref 4.22–5.81)
RDW: 13.3 % (ref 11.5–15.5)
WBC: 8.1 10*3/uL (ref 4.0–10.5)
nRBC: 0 % (ref 0.0–0.2)

## 2020-10-07 LAB — TROPONIN I (HIGH SENSITIVITY)
Troponin I (High Sensitivity): 6 ng/L (ref <18)
Troponin I (High Sensitivity): 6 ng/L (ref <18)

## 2020-10-07 NOTE — ED Provider Notes (Signed)
[facility] Provider Note: Georgena Spurling, MD, FACEP  CSN: [id number] MRN: [id number] ARRIVAL: 10/07/20 at [place]: [id number]   CHIEF COMPLAINT  Chest Pain   HISTORY OF PRESENT ILLNESS  10/07/20 4:07 AM Collin Morse is a 70 y.o. male with a history of CABG in 2011.  He is here with 4 days of chest pain.  The chest pain is located in his upper substernal region and he describes it as a gripping or tightening sensation.  It is present most of the time but usually comes on after his morning tea.  Eating lunch often improves the symptoms.  Symptoms are also better when sitting upright at night as opposed to sleeping supine.  Exertion does not worsen his symptoms and rest does not improve them.  He does not have associated shortness of breath, diaphoresis or nausea.  He is on omeprazole for acid reflux.  He has not gotten relief with additional doses of omeprazole or antacids.   Past Medical History:  Diagnosis Date   Coronary artery disease 2011   Coronary atherosclerosis of native coronary artery 12/18/2012   CABG LIMA to LAD, SVG to circumflex 01/2009   Dizziness and giddiness    GERD (gastroesophageal reflux disease)    Hernia    HTN (hypertension) since 2008   Hyperlipemia    Hypothyroidism    Prostate cancer [facility])     Past Surgical History:  Procedure Laterality Date   CATARACT EXTRACTION     Bilateral   CORONARY ARTERY BYPASS GRAFT  2011   GAS INSERTION Right 04/24/2016   Procedure: INSERTION OF GAS- SF6;  Surgeon: Jalene Mullet, MD;  Location: [place];  Service: Ophthalmology;  Laterality: Right;   LASER PHOTO ABLATION Right 04/24/2016   Procedure: LASER PHOTO ABLATION-ENDOLASER;  Surgeon: Jalene Mullet, MD;  Location: [place];  Service: Ophthalmology;  Laterality: Right;   PARS PLANA VITRECTOMY Right 04/24/2016   Procedure: PARS PLANA VITRECTOMY WITH 25 GAUGE;  Surgeon: Jalene Mullet, MD;  Location: [place];  Service: Ophthalmology;  Laterality: Right;   PROSTATE  SURGERY  2006    Family History  Problem Relation Age of Onset   Dementia Mother    High blood pressure Mother    Lung cancer Mother    Diabetes type II Mother    Heart Problems Father    Diabetes type II Father     Social History   Tobacco Use   Smoking status: Never   Smokeless tobacco: Never  Vaping Use   Vaping Use: Never used  Substance Use Topics   Alcohol use: Yes    Alcohol/week: 2.0 standard drinks    Types: 2 Cans of beer per week    Comment: OCC   Drug use: No    Prior to Admission medications   Medication Sig Start Date End Date Taking? Authorizing Provider  aspirin 81 MG tablet Take 81 mg by mouth daily.    [provider]  clobetasol (OLUX) 0.05 % topical foam Apply topically 2 (two) times daily. 06/10/19   Lavonna Monarch, MD  clopidogrel (PLAVIX) 75 MG tablet TAKE 1 TAB DAILY. PT TAKES MED IN THE PLACE OF ASPRIN WHEN HE GETS HIS ALLERGY SHOT IN [place]. 03/03/20   Jerline Pain, MD  [employer]S GENTLE LAXATIVE 5 MG EC tablet Take by mouth. 10/22/19   [provider]  ezetimibe (ZETIA) 10 MG tablet TAKE 1 TABLET BY MOUTH EVERY DAY 03/19/20   Jerline Pain, MD  GAVILYTE-C 240 g  solution Take by mouth. 10/22/19   [provider]  metFORMIN (GLUCOPHAGE) 500 MG tablet Take 500 mg by mouth daily.    [provider]  metoprolol succinate (TOPROL-XL) 50 MG 24 hr tablet TAKE 1/2 TABLET BY MOUTH IN THE MORNING AND TAKE 1 TABLET BY MOUTH IN THE EVENING. 04/02/20   Jerline Pain, MD  Multiple Vitamins-Minerals (MULTIVITAMIN PO) Take 1 tablet by mouth daily.     [provider]  nitroGLYCERIN (NITROSTAT) 0.4 MG SL tablet PLACE 1 TABLET (0.4 MG TOTAL) UNDER THE TONGUE EVERY 5 (FIVE) MINUTES AS NEEDED FOR CHEST PAIN. 09/29/19   Jerline Pain, MD  omeprazole (PRILOSEC) 20 MG capsule Take 20 mg by mouth daily. 10/30/19   [provider]  rosuvastatin (CRESTOR) 40 MG tablet TAKE 1 TABLET BY MOUTH EVERY DAY 03/22/20   Jerline Pain, MD   vardenafil (LEVITRA) 20 MG tablet Take 20 mg by mouth daily as needed for erectile dysfunction.    [provider]    Allergies Sulfa antibiotics and Tetracyclines & related   REVIEW OF SYSTEMS  Negative except as noted here or in the History of Present Illness.   PHYSICAL EXAMINATION  Initial Vital Signs Blood pressure (!) 191/81, pulse (!) 51, temperature 98.1 F (36.7 C), temperature source Oral, resp. rate 20, height 6\' 1"  (1.854 m), weight 93.9 kg, SpO2 98 %.  Examination General: Well-developed, well-nourished male in no acute distress; appearance consistent with age of record HENT: normocephalic; atraumatic Eyes: pupils mildly unequal (left greater than right), round and reactive to light; extraocular muscles intact; bilateral pseudophakia Neck: supple Heart: regular rate and rhythm; no murmur Lungs: clear to auscultation bilaterally Abdomen: soft; nondistended; nontender; bowel sounds present Extremities: No deformity; full range of motion; pulses normal Neurologic: Awake, alert and oriented; motor function intact in all extremities and symmetric; no facial droop Skin: Warm and dry Psychiatric: Normal mood and affect   RESULTS  Summary of this visit's results, reviewed and interpreted by myself:   EKG Interpretation  Date/Time:  Thursday October 07 2020 01:02:26 EDT Ventricular Rate:  50 PR Interval:  154 QRS Duration: 112 QT Interval:  418 QTC Calculation: 381 R Axis:   -64 Text Interpretation: Sinus bradycardia Left axis deviation Pulmonary disease pattern ST & T wave abnormality, consider anterolateral ischemia Rate is slower Confirmed by Shanon Rosser [phone]) on 10/07/2020 3:35:47 AM       Laboratory Studies: Results for orders placed or performed during the hospital encounter of 10/07/20 (from the past 24 hour(s))  Basic metabolic panel     Status: Abnormal   Collection Time: 10/07/20  1:48 AM  Result Value Ref Range   Sodium 139 135 - 145  mmol/L   Potassium 3.7 3.5 - 5.1 mmol/L   Chloride 105 98 - 111 mmol/L   CO2 23 22 - 32 mmol/L   Glucose, Bld 112 (H) 70 - 99 mg/dL   BUN 20 8 - 23 mg/dL   Creatinine, Ser 1.26 (H) 0.61 - 1.24 mg/dL   Calcium 8.9 8.9 - 10.3 mg/dL   GFR, Estimated >60 >60 mL/min   Anion gap 11 5 - 15  CBC     Status: None   Collection Time: 10/07/20  1:48 AM  Result Value Ref Range   WBC 8.1 4.0 - 10.5 K/uL   RBC 4.58 4.22 - 5.81 MIL/uL   Hemoglobin 14.6 13.0 - 17.0 g/dL   HCT 42.6 39.0 - 52.0 %   MCV 93.0 80.0 -  100.0 fL   MCH 31.9 26.0 - 34.0 pg   MCHC 34.3 30.0 - 36.0 g/dL   RDW 13.3 11.5 - 15.5 %   Platelets 180 150 - 400 K/uL   nRBC 0.0 0.0 - 0.2 %  Troponin I (High Sensitivity)     Status: None   Collection Time: 10/07/20  1:48 AM  Result Value Ref Range   Troponin I (High Sensitivity) 6 <18 ng/L  Troponin I (High Sensitivity)     Status: None   Collection Time: 10/07/20  3:12 AM  Result Value Ref Range   Troponin I (High Sensitivity) 6 <18 ng/L   Imaging Studies: DG Chest 2 View  Result Date: 10/07/2020 CLINICAL DATA:  Chest pain EXAM: CHEST - 2 VIEW COMPARISON:  07/29/2019 FINDINGS: Lungs are clear.  No pleural effusion or pneumothorax. The heart is normal in size. Postsurgical changes related to prior CABG. Median sternotomy.  Thoracic spine is within normal limits. IMPRESSION: Normal chest radiographs. Electronically Signed   By: Julian Hy M.D.   On: 10/07/2020 01:44    ED COURSE and MDM  Nursing notes, initial and subsequent vitals signs, including pulse oximetry, reviewed and interpreted by myself.  Vitals:   10/07/20 0108 10/07/20 0318 10/07/20 0415  BP: (!) 199/91 (!) 191/81 (!) 176/79  Pulse: (!) 50 (!) 51 (!) 46  Resp: 16 20 20   Temp: 98.1 F (36.7 C)    TempSrc: Oral    SpO2: 100% 98% 98%  Weight: 93.9 kg    Height: 6\' 1"  (1.854 m)     Medications - No data to display  The patient was advised of his HEART score, which is elevated.  He does not wish to be  admitted at this time and would prefer to follow-up with his cardiologist, Dr. Marlou Porch, as an outpatient.  He was advised to return should symptoms worsen otherwise he will contact Dr. Marlou Porch later today.   PROCEDURES  Procedures   ED DIAGNOSES     ICD-10-CM   1. Chest pain at rest  R07.9          Shanon Rosser, MD 10/07/20 [phone]

## 2020-10-07 NOTE — Telephone Encounter (Signed)
  I have left voicemail for patient to contact the office to schedule appt. I have put a hold on 10/08/20 @ 3:00 pm in case patient cannot be here today at 10 am.

## 2020-10-07 NOTE — Telephone Encounter (Signed)
Looks like I have a spot at 10 am today.  Let's get him in if possible.  Thanks Candee Furbish, MD

## 2020-10-07 NOTE — Telephone Encounter (Signed)
Pt is aware of appt tomorrow at 3 pm with Dr Marlou Porch to f/u recent CP.

## 2020-10-07 NOTE — Telephone Encounter (Signed)
-----   Message from Shanon Rosser, MD sent at 10/07/2020  4:33 AM EDT ----- Regarding: Follow Up This gentleman, a patient of years, has been having chest pain for most of the past 4 days.  The chest pain is primarily constant, located in his upper chest and is described as a gripping sensation.  It is worse after his morning TV and gets better after eating lunch.  It is also better at night when sitting upright as opposed to lying supine.  His EKG was not "normal" but not significantly changed from his prior EKG.  His troponins were 8 and 9.   He was offered admission but stated he would much prefer to follow-up with you as an outpatient.  Anticipate him contacting your office in the near future, perhaps even this morning.

## 2020-10-07 NOTE — ED Triage Notes (Signed)
Patient here POV from Home with Neck and Chest Pain.   Pain began approximately 4 days PTA and has been worsening since. Patient also noticed it has been worse tonight when lying flat. Patient has been complaining of some Weakness as well.  Pain does radiate through to Upper Back.  NAD noted during Triage. A&Ox4. GCS 15. No SOB. No N/V.

## 2020-10-08 ENCOUNTER — Encounter: Payer: Self-pay | Admitting: *Deleted

## 2020-10-08 ENCOUNTER — Ambulatory Visit (INDEPENDENT_AMBULATORY_CARE_PROVIDER_SITE_OTHER): Payer: Medicare Other | Admitting: Cardiology

## 2020-10-08 ENCOUNTER — Encounter: Payer: Self-pay | Admitting: Cardiology

## 2020-10-08 DIAGNOSIS — C61 Malignant neoplasm of prostate: Secondary | ICD-10-CM

## 2020-10-08 DIAGNOSIS — I25110 Atherosclerotic heart disease of native coronary artery with unstable angina pectoris: Secondary | ICD-10-CM

## 2020-10-08 DIAGNOSIS — E7800 Pure hypercholesterolemia, unspecified: Secondary | ICD-10-CM | POA: Diagnosis not present

## 2020-10-08 DIAGNOSIS — I10 Essential (primary) hypertension: Secondary | ICD-10-CM

## 2020-10-08 NOTE — Progress Notes (Signed)
Cardiology Office Note:    Date:  10/10/2020   ID:  TRESTON COKER, DOB 1950-07-30, MRN [id number]  PCP:  Wharton, [place], [place]  Cardiologist:  Candee Furbish, MD  Advanced Practice Provider:  No care team member to display Electrophysiologist:  None      Referring MD: Marda Stalker, PA-C    History of Present Illness:    KINTE TRIM is a 70 y.o. male here for the follow up of chest pain per ED.  Prior CABG 2011 with LIMA to LAD, SVG to circumflex.  Also has hypertension hyperlipidemia.  Nuclear stress test performed in 2013 and 2016 were both low risk.  We take care of [name] his wife.  Today: Mr. Omahoney presented to the ED 10/07/2020 with "gripping" chest pain in his upper substernal region for 4 days. His HEART score was elevated. He declined hospital admission, preferring to follow up with cardiology as an outpatient.  In the past week he has been very stressed at work. This has also caused some insomnia.  After returning from a golf trip, he felt severe throat tightness associated with tingling in his bilateral hands and feet. This prompted his ED visit yesterday.  He admits that he is been under increased stress with company employee decisions.  Today he notes having a painful bowel movement with muscle cramps, and noted that his throat tightness resolved afterward.  He denies any palpitations, or shortness of breath. No lightheadedness, headaches, syncope, orthopnea, or PND. Also has no lower extremity edema.   Past Medical History:  Diagnosis Date   Coronary artery disease 2011   Coronary atherosclerosis of native coronary artery 12/18/2012   CABG LIMA to LAD, SVG to circumflex 01/2009   Dizziness and giddiness    GERD (gastroesophageal reflux disease)    Hernia    HTN (hypertension) since 2008   Hyperlipemia    Hypothyroidism    Prostate cancer [facility])     Past Surgical History:  Procedure Laterality Date    CATARACT EXTRACTION     Bilateral   CORONARY ARTERY BYPASS GRAFT  2011   GAS INSERTION Right 04/24/2016   Procedure: INSERTION OF GAS- SF6;  Surgeon: Jalene Mullet, MD;  Location: [place];  Service: Ophthalmology;  Laterality: Right;   LASER PHOTO ABLATION Right 04/24/2016   Procedure: LASER PHOTO ABLATION-ENDOLASER;  Surgeon: Jalene Mullet, MD;  Location: [place];  Service: Ophthalmology;  Laterality: Right;   PARS PLANA VITRECTOMY Right 04/24/2016   Procedure: PARS PLANA VITRECTOMY WITH 25 GAUGE;  Surgeon: Jalene Mullet, MD;  Location: [place];  Service: Ophthalmology;  Laterality: Right;   PROSTATE SURGERY  2006    Current Medications: Current Meds  Medication Sig   aspirin EC 81 MG tablet Take 81 mg by mouth in the morning. Swallow whole.   clobetasol (OLUX) 0.05 % topical foam Apply topically 2 (two) times daily. (Patient taking differently: Apply 1 application topically 2 (two) times daily as needed (scalp flares psoriasis).)   clopidogrel (PLAVIX) 75 MG tablet TAKE 1 TAB DAILY. PT TAKES MED IN THE PLACE OF ASPRIN WHEN HE GETS HIS ALLERGY SHOT IN [place].   ezetimibe (ZETIA) 10 MG tablet TAKE 1 TABLET BY MOUTH EVERY DAY   metoprolol succinate (TOPROL-XL) 50 MG 24 hr tablet TAKE 1/2 TABLET BY MOUTH IN THE MORNING AND TAKE 1 TABLET BY MOUTH IN THE EVENING.   nitroGLYCERIN (NITROSTAT) 0.4 MG SL tablet PLACE 1 TABLET (0.4 MG TOTAL) UNDER THE  TONGUE EVERY 5 (FIVE) MINUTES AS NEEDED FOR CHEST PAIN.   omeprazole (PRILOSEC) 20 MG capsule Take 20 mg by mouth in the morning.   rosuvastatin (CRESTOR) 40 MG tablet TAKE 1 TABLET BY MOUTH EVERY DAY   vardenafil (LEVITRA) 20 MG tablet Take 20 mg by mouth daily as needed for erectile dysfunction.   [DISCONTINUED] aspirin 81 MG tablet Take 81 mg by mouth daily.   [DISCONTINUED] CVS GENTLE LAXATIVE 5 MG EC tablet Take by mouth.   [DISCONTINUED] GAVILYTE-C 240 g solution Take by mouth.   [DISCONTINUED] Multiple Vitamins-Minerals (MULTIVITAMIN PO) Take 1  tablet by mouth daily.      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 College education. Right handed. 3 children   Caffeine- two glasses daily ice tea.   Social Determinants of Health   Financial Resource Strain: Not on file  Food Insecurity: Not on file  Transportation Needs: Not on file  Physical Activity: Not on file  Stress: Not on file  Social Connections: Not on file     Family History: The patient's family history includes Dementia in his mother; Diabetes type II in his father and mother; Heart Problems in his father; High blood pressure in his mother; Lung cancer in his mother.  ROS:   Please see the history of present illness.    (+) Stress (+) Insomnia (+) Painful bowel movements All other systems reviewed and are negative.  EKGs/Labs/Other Studies Reviewed:    The following studies were reviewed today:  Lexiscan Myoview 07/15/2014: Nuclear stress EF: 53%. There was no ST segment deviation noted during stress. No T wave inversion was noted during stress. The study is normal. This is a low risk study.   Low risk stress nuclear study with normal perfusion and low normal left ventricular systolic  function.  EKG:  EKG is personally reviewed and interpreted. 10/08/2020: EKG was not ordered today.   Recent Labs: 10/07/2020: BUN 20; Creatinine, Ser 1.26; Hemoglobin 14.6; Platelets 180; Potassium 3.7; Sodium 139   Recent Lipid Panel    Component Value Date/Time   CHOL 134 03/12/2019 0908   TRIG 282 (H) 03/12/2019 0908   HDL 32 (L) 03/12/2019 0908   CHOLHDL 4.2 03/12/2019 0908   CHOLHDL 3 07/16/2014 0756   VLDL 18.4 07/16/2014 0756   LDLCALC 58 03/12/2019 0908     Risk Assessment/Calculations:      Physical Exam:    VS:  BP (!) 170/90 (BP Location: Left Arm, Patient Position: Sitting, Cuff Size: Normal)   Pulse 67   Ht 6\' 1"  (1.854 m)   Wt 215  lb (97.5 kg)   SpO2 97%   BMI 28.37 kg/m     Wt Readings from Last 3 Encounters:  10/08/20 215 lb (97.5 kg)  10/07/20 207 lb (93.9 kg)  03/11/20 223 lb (101.2 kg)     GEN:  Well nourished, well developed in no acute distress HEENT: Normal NECK: No JVD; No carotid bruits LYMPHATICS: No lymphadenopathy CARDIAC: RRR, no murmurs, rubs, gallops RESPIRATORY:  Clear to auscultation without rales, wheezing or rhonchi  ABDOMEN: Soft, non-tender, non-distended MUSCULOSKELETAL:  No edema; No deformity  SKIN: Warm and dry NEUROLOGIC:  Alert and oriented x 3 PSYCHIATRIC:  Normal affect   ASSESSMENT:    1. Atherosclerosis of native coronary artery of native heart with unstable angina pectoris ([place])   2. Pure hypercholesterolemia   3. Essential hypertension   4. Malignant neoplasm of prostate ([place])     PLAN:    In order of problems listed above: Coronary atherosclerosis of native coronary artery With his symptoms of chest discomfort, throat tightness concerning for his prior angina and his prior CABG with LIMA to LAD SVG to circumflex in 2011, we will go ahead and proceed with cardiac catheterization with possible percutaneous intervention.  Risks and benefits including stroke heart attack death renal impairment bleeding have  been neck discussed and he is willing to proceed.  He understands that if symptoms worsen or become more worrisome to report to the hospital if necessary. -For now continue with goal-directed medical therapy, aspirin 81 mg daily, Crestor 40 and Zetia 10 mg daily.  He is also on Plavix 75 mg only when switching from aspirin daily when getting allergy shots.  Once again he is not taking daily Plavix.  If a stent were placed, he would need to be loaded with Plavix. He is also on metoprolol 50 mg in the evening.  Hyperlipemia Continuing with Crestor 40 mg as well as Zetia 10 mg.  Excellent results.  No myalgias.  Last LDL June 2020 2274.  Continue with diet, exercise as well.  Creatinine 1.25 previously.  Essential hypertension Currently well controlled, metoprolol  Malignant neoplasm of prostate ([place]) Prior prostatectomy in 2005.  Stable.   Follow-up: Post catheterization/1 year.  Medication Adjustments/Labs and Tests Ordered: Current medicines are reviewed at length with the patient today.  Concerns regarding medicines are outlined above.   No orders of the defined types were placed in this encounter.  No orders of the defined types were placed in this encounter.  Patient Instructions  Medication Instructions:  The current medical regimen is effective;  continue present plan and medications.  *If you need a refill on your cardiac medications before your next appointment, please call your pharmacy*  Lab Work: None today If you have labs (blood work) drawn today and your tests are completely normal, you will receive your results only by: [place] (if you have MyChart) OR A paper copy in the mail If you have any lab test that is abnormal or we need to change your treatment, we will call you to review the results.  Testing/Procedures: Cardiac cath.  Follow-Up: At [facility], you and your health needs are our priority.  As part of our continuing mission to provide you with  exceptional heart care, we have created designated Provider Care Teams.  These Care Teams include your primary Cardiologist (physician) and Advanced Practice Providers (APPs -  Physician Assistants and Nurse Practitioners) who all work together to provide you with the care you need, when you need it.  We recommend signing up for the patient portal called "MyChart".  Sign up information is provided on this After Visit Summary.  MyChart is used to connect with patients for Virtual Visits (Telemedicine).  Patients are able to view lab/test results, encounter notes, upcoming appointments, etc.  Non-urgent messages can be sent to your provider as well.   To learn more about what you can do with MyChart, go to [email or web address].    Your next appointment:   After your heart cath.   Thank you for choosing [place]!!     I,Mathew Stumpf,acting as a scribe for Candee Furbish, MD.,have documented all relevant documentation on the behalf of Candee Furbish, MD,as directed by  Candee Furbish, MD while in the presence of Candee Furbish, MD.  I, Candee Furbish, MD, have reviewed all documentation for this visit. The documentation on 10/10/20 for the exam, diagnosis, procedures, and orders are all accurate and complete.   Signed, Candee Furbish, MD  10/10/2020 8:10 AM    [place]

## 2020-10-08 NOTE — H&P (View-Only) (Signed)
Cardiology Office Note:    Date:  10/10/2020   ID:  Collin Morse, DOB [birth year], MRN [id number]  PCP:  Wharton, Court[place], [place]  Cardiologist:  Candee Furbish, MD  Advanced Practice Provider:  No care team member to display Electrophysiologist:  None      Referring MD: Marda Stalker, PA-C    History of Present Illness:    Collin Morse is a 70 y.o. male here for the follow up of chest pain per ED.  Prior CABG 2011 with LIMA to LAD, SVG to circumflex.  Also has hypertension hyperlipidemia.  Nuclear stress test performed in 2013 and 2016 were both low risk.  We take care of Collin Morse his wife.  Today: Mr. Collin Morse presented to the ED 10/07/2020 with "gripping" chest pain in his upper substernal region for 4 days. His HEART score was elevated. He declined hospital admission, preferring to follow up with cardiology as an outpatient.  In the past week he has been very stressed at work. This has also caused some insomnia.  After returning from a golf trip, he felt severe throat tightness associated with tingling in his bilateral hands and feet. This prompted his ED visit yesterday.  He admits that he is been under increased stress with company employee decisions.  Today he notes having a painful bowel movement with muscle cramps, and noted that his throat tightness resolved afterward.  He denies any palpitations, or shortness of breath. No lightheadedness, headaches, syncope, orthopnea, or PND. Also has no lower extremity edema.   Past Medical History:  Diagnosis Date   Coronary artery disease 2011   Coronary atherosclerosis of native coronary artery 12/18/2012   CABG LIMA to LAD, SVG to circumflex 01/2009   Dizziness and giddiness    GERD (gastroesophageal reflux disease)    Hernia    HTN (hypertension) since 2008   Hyperlipemia    Hypothyroidism    Prostate cancer [facility])     Past Surgical History:  Procedure Laterality Date    CATARACT EXTRACTION     Bilateral   CORONARY ARTERY BYPASS GRAFT  2011   GAS INSERTION Right 04/24/2016   Procedure: INSERTION OF GAS- SF6;  Surgeon: Jalene Mullet, MD;  Location: [place];  Service: Ophthalmology;  Laterality: Right;   LASER PHOTO ABLATION Right 04/24/2016   Procedure: LASER PHOTO ABLATION-ENDOLASER;  Surgeon: Jalene Mullet, MD;  Location: [place];  Service: Ophthalmology;  Laterality: Right;   PARS PLANA VITRECTOMY Right 04/24/2016   Procedure: PARS PLANA VITRECTOMY WITH 25 GAUGE;  Surgeon: Jalene Mullet, MD;  Location: [place];  Service: Ophthalmology;  Laterality: Right;   PROSTATE SURGERY  2006    Current Medications: Current Meds  Medication Sig   aspirin EC 81 MG tablet Take 81 mg by mouth in the morning. Swallow whole.   clobetasol (OLUX) 0.05 % topical foam Apply topically 2 (two) times daily. (Patient taking differently: Apply 1 application topically 2 (two) times daily as needed (scalp flares psoriasis).)   clopidogrel (PLAVIX) 75 MG tablet TAKE 1 TAB DAILY. PT TAKES MED IN THE PLACE OF ASPRIN WHEN HE GETS HIS ALLERGY SHOT IN [place].   ezetimibe (ZETIA) 10 MG tablet TAKE 1 TABLET BY MOUTH EVERY DAY   metoprolol succinate (TOPROL-XL) 50 MG 24 hr tablet TAKE 1/2 TABLET BY MOUTH IN THE MORNING AND TAKE 1 TABLET BY MOUTH IN THE EVENING.   nitroGLYCERIN (NITROSTAT) 0.4 MG SL tablet PLACE 1 TABLET (0.4 MG TOTAL) UNDER THE  TONGUE EVERY 5 (FIVE) MINUTES AS NEEDED FOR CHEST PAIN.   omeprazole (PRILOSEC) 20 MG capsule Take 20 mg by mouth in the morning.   rosuvastatin (CRESTOR) 40 MG tablet TAKE 1 TABLET BY MOUTH EVERY DAY   vardenafil (LEVITRA) 20 MG tablet Take 20 mg by mouth daily as needed for erectile dysfunction.   [DISCONTINUED] aspirin 81 MG tablet Take 81 mg by mouth daily.   [DISCONTINUED] CVS GENTLE LAXATIVE 5 MG EC tablet Take by mouth.   [DISCONTINUED] GAVILYTE-C 240 g solution Take by mouth.   [DISCONTINUED] Multiple Vitamins-Minerals (MULTIVITAMIN PO) Take 1  tablet by mouth daily.      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 College education. Right handed. 3 children   Caffeine- two glasses daily ice tea.   Social Determinants of Health   Financial Resource Strain: Not on file  Food Insecurity: Not on file  Transportation Needs: Not on file  Physical Activity: Not on file  Stress: Not on file  Social Connections: Not on file     Family History: The patient's family history includes Dementia in his mother; Diabetes type II in his father and mother; Heart Problems in his father; High blood pressure in his mother; Lung cancer in his mother.  ROS:   Please see the history of present illness.    (+) Stress (+) Insomnia (+) Painful bowel movements All other systems reviewed and are negative.  EKGs/Labs/Other Studies Reviewed:    The following studies were reviewed today:  Lexiscan Myoview 07/15/2014: Nuclear stress EF: 53%. There was no ST segment deviation noted during stress. No T wave inversion was noted during stress. The study is normal. This is a low risk study.   Low risk stress nuclear study with normal perfusion and low normal left ventricular systolic  function.  EKG:  EKG is personally reviewed and interpreted. 10/08/2020: EKG was not ordered today.   Recent Labs: 10/07/2020: BUN 20; Creatinine, Ser 1.26; Hemoglobin 14.6; Platelets 180; Potassium 3.7; Sodium 139   Recent Lipid Panel    Component Value Date/Time   CHOL 134 03/12/2019 0908   TRIG 282 (H) 03/12/2019 0908   HDL 32 (L) 03/12/2019 0908   CHOLHDL 4.2 03/12/2019 0908   CHOLHDL 3 07/16/2014 0756   VLDL 18.4 07/16/2014 0756   LDLCALC 58 03/12/2019 0908     Risk Assessment/Calculations:      Physical Exam:    VS:  BP (!) 170/90 (BP Location: Left Arm, Patient Position: Sitting, Cuff Size: Normal)   Pulse 67   Ht 6\' 1"  (1.854 m)   Wt 215  lb (97.5 kg)   SpO2 97%   BMI 28.37 kg/m     Wt Readings from Last 3 Encounters:  10/08/20 215 lb (97.5 kg)  10/07/20 207 lb (93.9 kg)  03/11/20 223 lb (101.2 kg)     GEN:  Well nourished, well developed in no acute distress HEENT: Normal NECK: No JVD; No carotid bruits LYMPHATICS: No lymphadenopathy CARDIAC: RRR, no murmurs, rubs, gallops RESPIRATORY:  Clear to auscultation without rales, wheezing or rhonchi  ABDOMEN: Soft, non-tender, non-distended MUSCULOSKELETAL:  No edema; No deformity  SKIN: Warm and dry NEUROLOGIC:  Alert and oriented x 3 PSYCHIATRIC:  Normal affect   ASSESSMENT:    1. Atherosclerosis of native coronary artery of native heart with unstable angina pectoris ([place])   2. Pure hypercholesterolemia   3. Essential hypertension   4. Malignant neoplasm of prostate ([place])     PLAN:    In order of problems listed above: Coronary atherosclerosis of native coronary artery With his symptoms of chest discomfort, throat tightness concerning for his prior angina and his prior CABG with LIMA to LAD SVG to circumflex in 2011, we will go ahead and proceed with cardiac catheterization with possible percutaneous intervention.  Risks and benefits including stroke heart attack death renal impairment bleeding have  been neck discussed and he is willing to proceed.  He understands that if symptoms worsen or become more worrisome to report to the hospital if necessary. -For now continue with goal-directed medical therapy, aspirin 81 mg daily, Crestor 40 and Zetia 10 mg daily.  He is also on Plavix 75 mg only when switching from aspirin daily when getting allergy shots.  Once again he is not taking daily Plavix.  If a stent were placed, he would need to be loaded with Plavix. He is also on metoprolol 50 mg in the evening.  Hyperlipemia Continuing with Crestor 40 mg as well as Zetia 10 mg.  Excellent results.  No myalgias.  Last LDL June 2020 2274.  Continue with diet, exercise as well.  Creatinine 1.25 previously.  Essential hypertension Currently well controlled, metoprolol  Malignant neoplasm of prostate ([place]) Prior prostatectomy in 2005.  Stable.   Follow-up: Post catheterization/1 year.  Medication Adjustments/Labs and Tests Ordered: Current medicines are reviewed at length with the patient today.  Concerns regarding medicines are outlined above.   No orders of the defined types were placed in this encounter.  No orders of the defined types were placed in this encounter.  Patient Instructions  Medication Instructions:  The current medical regimen is effective;  continue present plan and medications.  *If you need a refill on your cardiac medications before your next appointment, please call your pharmacy*  Lab Work: None today If you have labs (blood work) drawn today and your tests are completely normal, you will receive your results only by: [place] (if you have MyChart) OR A paper copy in the mail If you have any lab test that is abnormal or we need to change your treatment, we will call you to review the results.  Testing/Procedures: Cardiac cath.  Follow-Up: At [facility], you and your health needs are our priority.  As part of our continuing mission to provide you with  exceptional heart care, we have created designated Provider Care Teams.  These Care Teams include your primary Cardiologist (physician) and Advanced Practice Providers (APPs -  Physician Assistants and Nurse Practitioners) who all work together to provide you with the care you need, when you need it.  We recommend signing up for the patient portal called "MyChart".  Sign up information is provided on this After Visit Summary.  MyChart is used to connect with patients for Virtual Visits (Telemedicine).  Patients are able to view lab/test results, encounter notes, upcoming appointments, etc.  Non-urgent messages can be sent to your provider as well.   To learn more about what you can do with MyChart, go to [email or web address].    Your next appointment:   After your heart cath.   Thank you for choosing [place]!!     I,Mathew Stumpf,acting as a scribe for Candee Furbish, MD.,have documented all relevant documentation on the behalf of Candee Furbish, MD,as directed by  Candee Furbish, MD while in the presence of Candee Furbish, MD.  I, Candee Furbish, MD, have reviewed all documentation for this visit. The documentation on 10/10/20 for the exam, diagnosis, procedures, and orders are all accurate and complete.   Signed, Candee Furbish, MD  10/10/2020 8:10 AM    [place]

## 2020-10-08 NOTE — Patient Instructions (Addendum)
Medication Instructions:  The current medical regimen is effective;  continue present plan and medications.  *If you need a refill on your cardiac medications before your next appointment, please call your pharmacy*  Lab Work: None today If you have labs (blood work) drawn today and your tests are completely normal, you will receive your results only by: [place] (if you have MyChart) OR A paper copy in the mail If you have any lab test that is abnormal or we need to change your treatment, we will call you to review the results.  Testing/Procedures: Cardiac cath.  Follow-Up: At [facility], you and your health needs are our priority.  As part of our continuing mission to provide you with exceptional heart care, we have created designated Provider Care Teams.  These Care Teams include your primary Cardiologist (physician) and Advanced Practice Providers (APPs -  Physician Assistants and Nurse Practitioners) who all work together to provide you with the care you need, when you need it.  We recommend signing up for the patient portal called "MyChart".  Sign up information is provided on this After Visit Summary.  MyChart is used to connect with patients for Virtual Visits (Telemedicine).  Patients are able to view lab/test results, encounter notes, upcoming appointments, etc.  Non-urgent messages can be sent to your provider as well.   To learn more about what you can do with MyChart, go to [email or web address].    Your next appointment:   After your heart cath.   Thank you for choosing [place]!!

## 2020-10-10 NOTE — Assessment & Plan Note (Signed)
With his symptoms of chest discomfort, throat tightness concerning for his prior angina and his prior CABG with LIMA to LAD SVG to circumflex in 2011, we will go ahead and proceed with cardiac catheterization with possible percutaneous intervention.  Risks and benefits including stroke heart attack death renal impairment bleeding have been neck discussed and he is willing to proceed.  He understands that if symptoms worsen or become more worrisome to report to the hospital if necessary. -For now continue with goal-directed medical therapy, aspirin 81 mg daily, Crestor 40 and Zetia 10 mg daily.  He is also on Plavix 75 mg only when switching from aspirin daily when getting allergy shots.  Once again he is not taking daily Plavix.  If a stent were placed, he would need to be loaded with Plavix. He is also on metoprolol 50 mg in the evening.

## 2020-10-10 NOTE — Assessment & Plan Note (Signed)
Currently well controlled, metoprolol

## 2020-10-10 NOTE — Assessment & Plan Note (Signed)
Prior prostatectomy in 2005.  Stable.

## 2020-10-10 NOTE — Assessment & Plan Note (Signed)
Continuing with Crestor 40 mg as well as Zetia 10 mg.  Excellent results.  No myalgias.  Last LDL June 2020 2274.  Continue with diet, exercise as well.  Creatinine 1.25 previously.

## 2020-10-11 ENCOUNTER — Other Ambulatory Visit: Payer: Self-pay

## 2020-10-11 ENCOUNTER — Telehealth: Payer: Self-pay | Admitting: Physician Assistant

## 2020-10-11 ENCOUNTER — Encounter (HOSPITAL_COMMUNITY)
Admission: RE | Disposition: A | Discharge status: Home / Self Care | Payer: Self-pay | Source: Home / Self Care | Attending: Cardiovascular Disease

## 2020-10-11 ENCOUNTER — Ambulatory Visit (HOSPITAL_COMMUNITY)
Admission: RE | Admit: 2020-10-11 | Discharge: 2020-10-11 | Disposition: A | Discharge status: Home / Self Care | Payer: Medicare Other | Attending: Cardiovascular Disease | Admitting: Cardiovascular Disease

## 2020-10-11 DIAGNOSIS — Z79899 Other long term (current) drug therapy: Secondary | ICD-10-CM | POA: Insufficient documentation

## 2020-10-11 DIAGNOSIS — C61 Malignant neoplasm of prostate: Secondary | ICD-10-CM

## 2020-10-11 DIAGNOSIS — I10 Essential (primary) hypertension: Secondary | ICD-10-CM | POA: Diagnosis not present

## 2020-10-11 DIAGNOSIS — Z7902 Long term (current) use of antithrombotics/antiplatelets: Secondary | ICD-10-CM | POA: Diagnosis not present

## 2020-10-11 DIAGNOSIS — Z888 Allergy status to other drugs, medicaments and biological substances status: Secondary | ICD-10-CM | POA: Insufficient documentation

## 2020-10-11 DIAGNOSIS — Z8249 Family history of ischemic heart disease and other diseases of the circulatory system: Secondary | ICD-10-CM | POA: Insufficient documentation

## 2020-10-11 DIAGNOSIS — I25110 Atherosclerotic heart disease of native coronary artery with unstable angina pectoris: Secondary | ICD-10-CM

## 2020-10-11 DIAGNOSIS — Z7982 Long term (current) use of aspirin: Secondary | ICD-10-CM | POA: Diagnosis not present

## 2020-10-11 DIAGNOSIS — Z8546 Personal history of malignant neoplasm of prostate: Secondary | ICD-10-CM | POA: Diagnosis not present

## 2020-10-11 DIAGNOSIS — Z951 Presence of aortocoronary bypass graft: Secondary | ICD-10-CM | POA: Insufficient documentation

## 2020-10-11 DIAGNOSIS — E7800 Pure hypercholesterolemia, unspecified: Secondary | ICD-10-CM | POA: Diagnosis not present

## 2020-10-11 DIAGNOSIS — Z882 Allergy status to sulfonamides status: Secondary | ICD-10-CM | POA: Insufficient documentation

## 2020-10-11 DIAGNOSIS — Z881 Allergy status to other antibiotic agents status: Secondary | ICD-10-CM | POA: Diagnosis not present

## 2020-10-11 HISTORY — PX: LEFT HEART CATH AND CORS/GRAFTS ANGIOGRAPHY: CATH118250

## 2020-10-11 SURGERY — LEFT HEART CATH AND CORS/GRAFTS ANGIOGRAPHY
Anesthesia: LOCAL

## 2020-10-11 MED ORDER — ASPIRIN EC 81 MG PO TBEC: 81.0000 mg | DELAYED_RELEASE_TABLET | Freq: Every morning | ORAL | Status: DC

## 2020-10-11 MED ORDER — LIDOCAINE HCL (PF) 1 % IJ SOLN
INTRAMUSCULAR | Status: DC | PRN
  Administered 2020-10-11: 15 mL

## 2020-10-11 MED ORDER — SODIUM CHLORIDE 0.9 % WEIGHT BASED INFUSION
3.0000 mL/kg/h | INTRAVENOUS | Status: AC
  Administered 2020-10-11: 3 mL/kg/h via INTRAVENOUS

## 2020-10-11 MED ORDER — SODIUM CHLORIDE 0.9% FLUSH: 3.0000 mL | INTRAVENOUS | Status: DC | PRN

## 2020-10-11 MED ORDER — FENTANYL CITRATE (PF) 100 MCG/2ML IJ SOLN
INTRAMUSCULAR | Status: DC | PRN
  Administered 2020-10-11: 25 ug via INTRAVENOUS

## 2020-10-11 MED ORDER — ACETAMINOPHEN 325 MG PO TABS: 650.0000 mg | ORAL_TABLET | ORAL | Status: DC | PRN

## 2020-10-11 MED ORDER — MIDAZOLAM HCL 2 MG/2ML IJ SOLN
INTRAMUSCULAR | Status: DC | PRN
  Administered 2020-10-11: 2 mg via INTRAVENOUS

## 2020-10-11 MED ORDER — SODIUM CHLORIDE 0.9 % IV SOLN: 250.0000 mL | INTRAVENOUS | Status: DC | PRN

## 2020-10-11 MED ORDER — ASPIRIN 81 MG PO CHEW: 81.0000 mg | CHEWABLE_TABLET | ORAL | Status: DC

## 2020-10-11 MED ORDER — IOHEXOL 350 MG/ML SOLN
INTRAVENOUS | Status: DC | PRN
  Administered 2020-10-11: 95 mL

## 2020-10-11 MED ORDER — AMLODIPINE BESYLATE 5 MG PO TABS: 5.0000 mg | ORAL_TABLET | Freq: Every day | ORAL | Status: DC

## 2020-10-11 MED ORDER — ONDANSETRON HCL 4 MG/2ML IJ SOLN: 4.0000 mg | Freq: Four times a day (QID) | INTRAMUSCULAR | Status: DC | PRN

## 2020-10-11 MED ORDER — HEPARIN (PORCINE) IN NACL 1000-0.9 UT/500ML-% IV SOLN
INTRAVENOUS | Status: AC
  Filled 2020-10-11: qty 1000

## 2020-10-11 MED ORDER — SODIUM CHLORIDE 0.9% FLUSH: 3.0000 mL | Freq: Two times a day (BID) | INTRAVENOUS | Status: DC

## 2020-10-11 MED ORDER — ROSUVASTATIN CALCIUM 40 MG PO TABS: 40.0000 mg | ORAL_TABLET | Freq: Every day | ORAL | Status: DC

## 2020-10-11 MED ORDER — AMLODIPINE BESYLATE 5 MG PO TABS: 5.0000 mg | ORAL_TABLET | Freq: Every day | ORAL | 3 refills | Status: DC

## 2020-10-11 MED ORDER — ASPIRIN 81 MG PO CHEW: 81.0000 mg | CHEWABLE_TABLET | Freq: Every day | ORAL | Status: DC

## 2020-10-11 MED ORDER — SODIUM CHLORIDE 0.9 % WEIGHT BASED INFUSION: 1.0000 mL/kg/h | INTRAVENOUS | Status: DC

## 2020-10-11 MED ORDER — HEPARIN (PORCINE) IN NACL 1000-0.9 UT/500ML-% IV SOLN
INTRAVENOUS | Status: DC | PRN
  Administered 2020-10-11 (×2): 500 mL

## 2020-10-11 MED ORDER — DIAZEPAM 5 MG PO TABS: 5.0000 mg | ORAL_TABLET | Freq: Four times a day (QID) | ORAL | Status: DC | PRN

## 2020-10-11 MED ORDER — LABETALOL HCL 5 MG/ML IV SOLN: 10.0000 mg | INTRAVENOUS | Status: DC | PRN

## 2020-10-11 MED ORDER — LIDOCAINE HCL (PF) 1 % IJ SOLN
INTRAMUSCULAR | Status: AC
  Filled 2020-10-11: qty 30

## 2020-10-11 MED ORDER — SODIUM CHLORIDE 0.9 % IV SOLN: INTRAVENOUS | Status: DC

## 2020-10-11 MED ORDER — HYDRALAZINE HCL 20 MG/ML IJ SOLN: 10.0000 mg | INTRAMUSCULAR | Status: DC | PRN

## 2020-10-11 MED ORDER — FENTANYL CITRATE (PF) 100 MCG/2ML IJ SOLN
INTRAMUSCULAR | Status: AC
  Filled 2020-10-11: qty 2

## 2020-10-11 MED ORDER — MIDAZOLAM HCL 2 MG/2ML IJ SOLN
INTRAMUSCULAR | Status: AC
  Filled 2020-10-11: qty 2

## 2020-10-11 MED ORDER — EZETIMIBE 10 MG PO TABS: 10.0000 mg | ORAL_TABLET | Freq: Every day | ORAL | Status: DC

## 2020-10-11 SURGICAL SUPPLY — 9 items
CATH INFINITI 5FR MULTPACK ANG (CATHETERS) ×2 IMPLANT
CLOSURE MYNX CONTROL 5F (Vascular Products) ×2 IMPLANT
KIT HEART LEFT (KITS) ×2 IMPLANT
PACK CARDIAC CATHETERIZATION (CUSTOM PROCEDURE TRAY) ×2 IMPLANT
SHEATH PINNACLE 5F 10CM (SHEATH) ×2 IMPLANT
SHEATH PROBE COVER 6X72 (BAG) ×2 IMPLANT
TRANSDUCER W/STOPCOCK (MISCELLANEOUS) ×2 IMPLANT
TUBING CIL FLEX 10 FLL-RA (TUBING) ×2 IMPLANT
WIRE J 3MM .035X145CM (WIRE) ×2 IMPLANT

## 2020-10-11 NOTE — Interval H&P Note (Signed)
Cath Lab Visit (complete for each Cath Lab visit)  Clinical Evaluation Leading to the Procedure:   ACS: No.  Non-ACS:    Anginal Classification: CCS II  Anti-ischemic medical therapy: Minimal Therapy (1 class of medications)  Non-Invasive Test Results: No non-invasive testing performed  Prior CABG: Previous CABG      History and Physical Interval Note:  10/11/2020 11:30 AM  Ok Edwards  has presented today for surgery, with the diagnosis of CAD.  The various methods of treatment have been discussed with the patient and family. After consideration of risks, benefits and other options for treatment, the patient has consented to  Procedure(s): LEFT HEART CATH AND CORS/GRAFTS ANGIOGRAPHY (N/A) as a surgical intervention.  The patient's history has been reviewed, patient examined, no change in status, stable for surgery.  I have reviewed the patient's chart and labs.  Questions were answered to the patient's satisfaction.     Shelva Majestic

## 2020-10-11 NOTE — Telephone Encounter (Signed)
Per Dr. Claiborne Billings, Add Amlodipine 5mg  for antihypertension and antianginal. Spoke with the patient and sent Rx to requested pharmacy. See cath report for details.

## 2020-10-12 ENCOUNTER — Other Ambulatory Visit: Payer: Self-pay

## 2020-10-12 ENCOUNTER — Encounter (HOSPITAL_COMMUNITY): Payer: Self-pay | Admitting: Cardiovascular Disease

## 2020-10-12 ENCOUNTER — Ambulatory Visit (INDEPENDENT_AMBULATORY_CARE_PROVIDER_SITE_OTHER): Payer: Medicare Other | Admitting: Dermatology

## 2020-10-12 DIAGNOSIS — I25110 Atherosclerotic heart disease of native coronary artery with unstable angina pectoris: Secondary | ICD-10-CM | POA: Diagnosis not present

## 2020-10-12 DIAGNOSIS — L821 Other seborrheic keratosis: Secondary | ICD-10-CM | POA: Diagnosis not present

## 2020-10-12 DIAGNOSIS — Z1283 Encounter for screening for malignant neoplasm of skin: Secondary | ICD-10-CM

## 2020-10-12 DIAGNOSIS — L409 Psoriasis, unspecified: Secondary | ICD-10-CM | POA: Diagnosis not present

## 2020-10-12 DIAGNOSIS — Z85828 Personal history of other malignant neoplasm of skin: Secondary | ICD-10-CM

## 2020-10-12 DIAGNOSIS — L304 Erythema intertrigo: Secondary | ICD-10-CM

## 2020-10-12 MED ORDER — CLOBETASOL PROPIONATE 0.05 % EX FOAM: 1.0000 "application " | Freq: Two times a day (BID) | CUTANEOUS | 6 refills | Status: DC | PRN

## 2020-10-13 DIAGNOSIS — M542 Cervicalgia: Secondary | ICD-10-CM | POA: Diagnosis not present

## 2020-10-18 ENCOUNTER — Telehealth: Payer: Self-pay | Admitting: Cardiology

## 2020-10-18 MED ORDER — IRBESARTAN 75 MG PO TABS: 75.0000 mg | ORAL_TABLET | Freq: Every day | ORAL | 2 refills | Status: DC

## 2020-10-18 NOTE — Telephone Encounter (Signed)
I feel the flushing feeling could be from high blood pressure.  Ok to stop the amlodipine. Please have him start irbesartan 75mg  daily- check his BP at home and schedule in HTN clinic in about a week.

## 2020-10-18 NOTE — Telephone Encounter (Signed)
Pt aware of recommendations and would rather wait and see Dr Marlou Porch as planned and evaluate more at that time not HTN clinic Pt is willing to try Irbesartan 75 mg and will check B/P daily and bring readings to upcoming appt ./cy

## 2020-10-18 NOTE — Telephone Encounter (Signed)
Pt c/o medication issue:  1. Name of Medication: amLODipine (NORVASC) 5 MG tablet  2. How are you currently taking this medication (dosage and times per day)? Patient taking 5 mg daily since Tues 10/12/20  3. Are you having a reaction (difficulty breathing--STAT)? Fatigued (especially in the afternoon) Chills, tingling sensation when he sleeps at night  4. What is your medication issue? Patient said he has not had any of these symptoms until he started taking this medication. They get progressively worse each day. He was unable to sleep last night which concerned him

## 2020-10-18 NOTE — Telephone Encounter (Signed)
Per Cath report "RECOMMENDATION: Medical therapy.  The patient has been on beta-blocker therapy.  With his recent increased blood pressure, will add amlodipine for anti-ischemic, antispasm, as well as blood pressure benefit.  Continue aggressive lipid-lowering therapy with target LDL less than 70"   Patient complaining that since he started on amlodipine that he has been fatigued and feeling drained in the evenings. He stated he has been having a flush feeling in his face when laying down, and chills. Patient reports having tingling in hands and feet, and a small knot in his groin area at the procedure site. Patient stated he is not going to take amlodipine any more and did not take it today. Patient does not take his BP and HR at home, so no vital signs to report. Will forward to Dr. Marlou Porch and Pharm D to see what would be a good substitute to take. At this time patient takes metoprolol 25 mg in AM and 50 mg  in PM.

## 2020-10-20 NOTE — Telephone Encounter (Signed)
Pt aware that Dr Marlou Porch is okay with plan and while was on phone pt mentioned at cath site a small raised area but feels it has reduced in size somewhat  no pain just some itching noted Pt is going out of town next Thursday (10/28/20) and if no improvement prior to this trip  will call back ./cy

## 2020-10-20 NOTE — Telephone Encounter (Signed)
Agree 

## 2020-10-23 DIAGNOSIS — Z23 Encounter for immunization: Secondary | ICD-10-CM | POA: Diagnosis not present

## 2020-10-26 DIAGNOSIS — M542 Cervicalgia: Secondary | ICD-10-CM | POA: Diagnosis not present

## 2020-10-31 ENCOUNTER — Encounter: Payer: Self-pay | Admitting: Dermatology

## 2020-10-31 NOTE — Progress Notes (Signed)
   Follow-Up Visit   Subjective  Collin Morse is a 70 y.o. male who presents for the following: Follow-up (3 month f/u right chest- no concerns- healing ok).  Follow-up post skin cancer chest, recheck psoriasis. Location:  Duration:  Quality:  Associated Signs/Symptoms: Modifying Factors:  Severity:  Timing: Context:   Objective  Well appearing patient in no apparent distress; mood and affect are within normal limits. Torso - Posterior (Back) Waist up skin examination: No other suspicious spots.  Left Lower Back Flattopped textured 4 mm brown papule  Chest - Medial (Center) Scaling and erythema over sternum compatible with seborrheic dermatitis.  May use his psoriasis cream on this for 1 to 2 weeks but avoid using if there is involvement on face.  Left Elbow - Posterior Small plaque psoriasis  Right Breast No sign residual cancer    All skin waist up examined.   Assessment & Plan    Encounter for screening for malignant neoplasm of skin Torso - Posterior (Back)  Annual skin examination  Seborrheic keratosis Left Lower Back  Leave if stable  Erythema intertrigo Chest - Medial (Center)  Clobetasol daily for 1 to 2 weeks  Related Medications clobetasol (OLUX) 0.05 % topical foam Apply 1 application topically 2 (two) times daily as needed (scalp flares psoriasis).  Psoriasis Left Elbow - Posterior  Clobetasol daily for up to 1 month.  Taper if improving  Related Medications clobetasol (OLUX) 0.05 % topical foam Apply 1 application topically 2 (two) times daily as needed (scalp flares psoriasis).  Personal history of skin cancer Right Breast  Recheck as needed change      I, Lavonna Monarch, MD, have reviewed all documentation for this visit.  The documentation on 10/31/20 for the exam, diagnosis, procedures, and orders are all accurate and complete.

## 2020-11-02 DIAGNOSIS — M542 Cervicalgia: Secondary | ICD-10-CM | POA: Diagnosis not present

## 2020-11-03 ENCOUNTER — Encounter: Payer: Self-pay | Admitting: Cardiology

## 2020-11-03 ENCOUNTER — Ambulatory Visit (INDEPENDENT_AMBULATORY_CARE_PROVIDER_SITE_OTHER): Payer: Medicare Other | Admitting: Cardiology

## 2020-11-03 ENCOUNTER — Other Ambulatory Visit: Payer: Self-pay

## 2020-11-03 DIAGNOSIS — E782 Mixed hyperlipidemia: Secondary | ICD-10-CM | POA: Diagnosis not present

## 2020-11-03 DIAGNOSIS — I25709 Atherosclerosis of coronary artery bypass graft(s), unspecified, with unspecified angina pectoris: Secondary | ICD-10-CM | POA: Diagnosis not present

## 2020-11-03 DIAGNOSIS — I25110 Atherosclerotic heart disease of native coronary artery with unstable angina pectoris: Secondary | ICD-10-CM

## 2020-11-03 DIAGNOSIS — C61 Malignant neoplasm of prostate: Secondary | ICD-10-CM

## 2020-11-03 DIAGNOSIS — K224 Dyskinesia of esophagus: Secondary | ICD-10-CM | POA: Diagnosis not present

## 2020-11-03 DIAGNOSIS — I10 Essential (primary) hypertension: Secondary | ICD-10-CM

## 2020-11-03 MED ORDER — IRBESARTAN 150 MG PO TABS: 150.0000 mg | ORAL_TABLET | Freq: Every day | ORAL | 1 refills | Status: DC

## 2020-11-03 NOTE — Assessment & Plan Note (Addendum)
Continue with Crestor 40 mg daily, Zetia 10 mg daily.  LDL goal definitely less than 70, would like to see it less than 55.  Previous LDL in June was 74.  I think we should consider PCSK9 inhibitor.  I will go ahead and have him talk to our pharmacy team.

## 2020-11-03 NOTE — Assessment & Plan Note (Signed)
Prostatectomy in 2005.  Stable.

## 2020-11-03 NOTE — Patient Instructions (Addendum)
Medication Instructions:   START TAKING IRBESARTAN 150 MG ONCE A DAY   ( TAKE YOUR BLOOD AT LEAST ONCE A DAY  AT LEAST A HOUR AFTER TAKING MEDICATIONS  KEEP A BLOOD PRESSURE LOG AND BRING TO FOLLOW UP APPOINTMENT)  *If you need a refill on your cardiac medications before your next appointment, please call your pharmacy*   Lab Work: NONE ORDERED  TODAY    If you have labs (blood work) drawn today and your tests are completely normal, you will receive your results only by: [place] (if you have MyChart) OR A paper copy in the mail If you have any lab test that is abnormal or we need to change your treatment, we will call you to review the results.   Testing/Procedures NONE ORDERED  TODAY    Follow-Up: At [facility], you and your health needs are our priority.  As part of our continuing mission to provide you with exceptional heart care, we have created designated Provider Care Teams.  These Care Teams include your primary Cardiologist (physician) and Advanced Practice Providers (APPs -  Physician Assistants and Nurse Practitioners) who all work together to provide you with the care you need, when you need it.  We recommend signing up for the patient portal called "MyChart".  Sign up information is provided on this After Visit Summary.  MyChart is used to connect with patients for Virtual Visits (Telemedicine).  Patients are able to view lab/test results, encounter notes, upcoming appointments, etc.  Non-urgent messages can be sent to your provider as well.   To learn more about what you can do with MyChart, go to [email or web address].    Your next appointment:   3 month(s)  The format for your next appointment:   In Person  Provider:   You will see one of the following Advanced Practice Providers on your designated Care Team:   [place], PA-C  Ermalinda Barrios, [place]      Other Instructions

## 2020-11-03 NOTE — Assessment & Plan Note (Signed)
Was asked to try irbesartan 75 mg once a day.  Amlodipine 5 mg was stopped.  Currently on metoprolol succinate 50 mg a day.

## 2020-11-03 NOTE — Progress Notes (Signed)
Cardiology Office Note:    Date:  11/03/2020   ID:  Collin Morse, DOB May 27, 1950, MRN [id number]  PCP:  Marda Stalker, PA-C   [facility] Providers Cardiologist:  Candee Furbish, MD     Referring MD: Marda Stalker, PA-C     History of Present Illness:    Collin Morse is a 70 y.o. male here for follow-up postcardiac catheterization with known CABG LIMA to LAD SVG to circumflex in 2011.  Overall no intervention took place.  Continue with medical therapy.  Blood pressures were elevated and per telephone note was willing to try irbesartan 75 mg daily.  He stopped the amlodipine which was started for antispasmodic of coronary arteries.  He was feeling flushing.  His future son-in-law is a GI doctor, they discussed esophageal spasm.  He does admit that after taking the amlodipine for 2 days his symptoms seem to go away.  He thinks that they were associated with high stress events that usually occur once or twice a year.  He also increased his proton pump inhibitor.  He felt as though the amlodipine was causing more worry at night.  I put it on his medication intolerances.  He does take Cialis occasionally, vacations for instance.  This has implications for isosorbide.  [facility] at [facility] 144/[facility]  Past Medical History:  Diagnosis Date   Coronary artery disease 2011   Coronary atherosclerosis of native coronary artery 12/18/2012   CABG LIMA to LAD, SVG to circumflex 01/2009   Dizziness and giddiness    GERD (gastroesophageal reflux disease)    Hernia    HTN (hypertension) since 2008   Hyperlipemia    Hypothyroidism    Prostate cancer [facility])     Past Surgical History:  Procedure Laterality Date   CATARACT EXTRACTION     Bilateral   CORONARY ARTERY BYPASS GRAFT  2011   GAS INSERTION Right 04/24/2016   Procedure: INSERTION OF GAS- SF6;  Surgeon: Jalene Mullet, MD;  Location: [place];  Service: Ophthalmology;  Laterality: Right;   LASER PHOTO ABLATION Right 04/24/2016    Procedure: LASER PHOTO ABLATION-ENDOLASER;  Surgeon: Jalene Mullet, MD;  Location: [place];  Service: Ophthalmology;  Laterality: Right;   LEFT HEART CATH AND CORS/GRAFTS ANGIOGRAPHY N/A 10/11/2020   Procedure: LEFT HEART CATH AND CORS/GRAFTS ANGIOGRAPHY;  Surgeon: Troy Sine, MD;  Location: [place] [facility];  Service: Cardiovascular;  Laterality: N/A;   PARS PLANA VITRECTOMY Right 04/24/2016   Procedure: PARS PLANA VITRECTOMY WITH 25 GAUGE;  Surgeon: Jalene Mullet, MD;  Location: [place];  Service: Ophthalmology;  Laterality: Right;   PROSTATE SURGERY  2006    Current Medications: Current Meds  Medication Sig   aspirin EC 81 MG tablet Take 81 mg by mouth in the morning. Swallow whole.   clobetasol (OLUX) 0.05 % topical foam Apply 1 application topically 2 (two) times daily as needed (scalp flares psoriasis).   clopidogrel (PLAVIX) 75 MG tablet TAKE 1 TAB DAILY. PT TAKES MED IN THE PLACE OF ASPRIN WHEN HE GETS HIS ALLERGY SHOT IN [place].   ezetimibe (ZETIA) 10 MG tablet TAKE 1 TABLET BY MOUTH EVERY DAY   irbesartan (AVAPRO) 75 MG tablet Take 1 tablet (75 mg total) by mouth daily.   metoprolol succinate (TOPROL-XL) 50 MG 24 hr tablet TAKE 1/2 TABLET BY MOUTH IN THE MORNING AND TAKE 1 TABLET BY MOUTH IN THE EVENING.   nitroGLYCERIN (NITROSTAT) 0.4 MG SL tablet PLACE 1 TABLET (0.4 MG TOTAL) UNDER  THE TONGUE EVERY 5 (FIVE) MINUTES AS NEEDED FOR CHEST PAIN.   omeprazole (PRILOSEC) 20 MG capsule Take 20 mg by mouth in the morning.   rosuvastatin (CRESTOR) 40 MG tablet TAKE 1 TABLET BY MOUTH EVERY DAY   vardenafil (LEVITRA) 20 MG tablet Take 20 mg by mouth daily as needed for erectile dysfunction.     Allergies:   Metformin and related, Amlodipine besylate, Sulfa antibiotics, and Tetracyclines & related   Social History   Socioeconomic History   Marital status: Married    Spouse name: [name]   Number of children: 3   Years of education: college   Highest education level: Not on file   Occupational History   Occupation: [employer] [place] Teacher, music: [employer]  Tobacco Use   Smoking status: Never   Smokeless tobacco: Never  Vaping Use   Vaping Use: Never used  Substance and Sexual Activity   Alcohol use: Yes    Alcohol/week: 2.0 standard drinks    Types: 2 Cans of beer per week    Comment: O[facility]   Drug use: No   Sexual activity: Not on file  Other Topics Concern   Not on file  Social History Narrative   Patient works for [employer], Patient lives at home with his wife [name]). College education. Right handed. 3 children   Caffeine- two glasses daily ice tea.   Social Determinants of Health   Financial Resource Strain: Not on file  Food Insecurity: Not on file  Transportation Needs: Not on file  Physical Activity: Not on file  Stress: Not on file  Social Connections: Not on file     Family History: The patient's family history includes Dementia in his mother; Diabetes type II in his father and mother; Heart Problems in his father; High blood pressure in his mother; Lung cancer in his mother.  ROS:   Please see the history of present illness.     All other systems reviewed and are negative.  EKGs/Labs/Other Studies Reviewed:    The following studies were reviewed today:  Cath 10/11/20: Widely patent LIMA graft supplying the mid LAD with proximal 30%, 70% stenoses and 90% stenosis proximal to the LIMA insertion site.   Widely patent SVG supplying the left circumflex obtuse marginal vessel.   Large dominant unbypassed RCA with mild luminal irregularity and 50% mid stenoses.   Normal LV function without focal segmental wall motion abnormality.  LVEDP 9 mmHg.  Diagnostic Dominance: Right    RECOMMENDATION: Medical therapy.  The patient has been on beta-blocker therapy.  With his recent increased blood pressure, will add amlodipine for anti-ischemic, antispasm, as well as blood pressure benefit.  Continue aggressive  lipid-lowering therapy with target LDL less than 70.  Recent Labs: 10/07/2020: BUN 20; Creatinine, Ser 1.26; Hemoglobin 14.6; Platelets 180; Potassium 3.7; Sodium 139  Recent Lipid Panel    Component Value Date/Time   CHOL 134 03/12/2019 0908   TRIG 282 (H) 03/12/2019 0908   HDL 32 (L) 03/12/2019 0908   CHOLHDL 4.2 03/12/2019 0908   CHOLHDL 3 07/16/2014 0756   VLDL 18.4 07/16/2014 0756   LDLCALC 58 03/12/2019 0908   Physical Exam:    VS:  BP (!) 158/90   Pulse 64   Ht 6' (1.829 m)   Wt 219 lb (99.3 kg)   SpO2 98%   BMI 29.70 kg/m     Wt Readings from Last 3 Encounters:  11/03/20 219 lb (99.3 kg)  10/11/20 210 lb (95.3 kg)  10/08/20 215 lb (97.5 kg)     GEN:  Well nourished, well developed in no acute distress HEENT: Normal NECK: No JVD; No carotid bruits LYMPHATICS: No lymphadenopathy CARDIAC: CABG scar RRR, no murmurs, rubs, gallops RESPIRATORY:  Clear to auscultation without rales, wheezing or rhonchi  ABDOMEN: Soft, non-tender, non-distended MUSCULOSKELETAL:  No edema; No deformity.  Small knot above arteriotomy site.  Likely small hematoma.  Should improve over time.  No difficulties with blood flow.  No bruits. SKIN: Warm and dry NEUROLOGIC:  Alert and oriented x 3 PSYCHIATRIC:  Normal affect   ASSESSMENT:    1. Essential hypertension   2. Mixed hyperlipidemia   3. Malignant neoplasm of prostate ([place])   4. Coronary artery disease involving coronary bypass graft of native heart with angina pectoris ([place])   5. Esophageal spasm    PLAN:    In order of problems listed above:  Coronary artery disease involving coronary bypass graft of native heart with angina pectoris ([place]) Prior CABG LIMA to LAD SVG to circumflex 2011.  Grafts widely patent from cardiac catheterization.  Continue with medical management.  Amlodipine was trialed but he had flushing.  This was stopped and irbesartan 75 mg was started.  Increasing the irbesartan 150 mg for blood pressure control.   I have instructed him to utilize his short acting nitroglycerin as long as he is not taking the Cialis within 36 to 48 hours.  If absolutely necessary, we could always try amlodipine again in the future.  Essential hypertension Was asked to try irbesartan 75 mg once a day.  Amlodipine 5 mg was stopped.  Currently on metoprolol succinate 50 mg a day.  Mixed hyperlipidemia Continue with Crestor 40 mg daily, Zetia 10 mg daily.  LDL goal definitely less than 70, would like to see it less than 55.  Previous LDL in June was 74.  I think we should consider PCSK9 inhibitor.  I will go ahead and have him talk to our pharmacy team.  Malignant neoplasm of prostate ([place]) Prostatectomy in 2005.  Stable.  Esophageal spasm Amlodipine may have helped for 2 days but he ended up stopping it because of a feeling of flushing and some difficulty sleeping.  Overall, I have instructed him to utilize nitroglycerin short acting if needs      Medication Adjustments/Labs and Tests Ordered: Current medicines are reviewed at length with the patient today.  Concerns regarding medicines are outlined above.  No orders of the defined types were placed in this encounter.  No orders of the defined types were placed in this encounter.   Patient Instructions  Medication Instructions:   START TAKING IRBESARTAN 150 MG   ( TAKE YOUR BLOOD AT LEAST ONCE A DAY  AT LEAST A HOUR AFTER TAKING MEDICATIONS  KEEP A BLOOD PRESSURE LOG AND BRING TO FOLLOW UP APPOINTMENT)  *If you need a refill on your cardiac medications before your next appointment, please call your pharmacy*   Lab Work: NONE ORDERED  TODAY    If you have labs (blood work) drawn today and your tests are completely normal, you will receive your results only by: [place] (if you have MyChart) OR A paper copy in the mail If you have any lab test that is abnormal or we need to change your treatment, we will call you to review the  results.   Testing/Procedures NONE ORDERED  TODAY    Follow-Up: At [facility], you and your health  needs are our priority.  As part of our continuing mission to provide you with exceptional heart care, we have created designated Provider Care Teams.  These Care Teams include your primary Cardiologist (physician) and Advanced Practice Providers (APPs -  Physician Assistants and Nurse Practitioners) who all work together to provide you with the care you need, when you need it.  We recommend signing up for the patient portal called "MyChart".  Sign up information is provided on this After Visit Summary.  MyChart is used to connect with patients for Virtual Visits (Telemedicine).  Patients are able to view lab/test results, encounter notes, upcoming appointments, etc.  Non-urgent messages can be sent to your provider as well.   To learn more about what you can do with MyChart, go to [email or web address].    Your next appointment:   3 month(s)  The format for your next appointment:   In Person  Provider:   You will see one of the following Advanced Practice Providers on your designated Care Team:   Bernerd Pho, PA-C  Ermalinda Barrios, [place]      Other Instructions   Signed, Candee Furbish, MD  11/03/2020 9:31 AM    [place]

## 2020-11-03 NOTE — Assessment & Plan Note (Signed)
Amlodipine may have helped for 2 days but he ended up stopping it because of a feeling of flushing and some difficulty sleeping.  Overall, I have instructed him to utilize nitroglycerin short acting if needs

## 2020-11-03 NOTE — Assessment & Plan Note (Addendum)
Prior CABG LIMA to LAD SVG to circumflex 2011.  Grafts widely patent from cardiac catheterization.  Continue with medical management.  Amlodipine was trialed but he had flushing.  This was stopped and irbesartan 75 mg was started.  Increasing the irbesartan 150 mg for blood pressure control.  I have instructed him to utilize his short acting nitroglycerin as long as he is not taking the Cialis within 36 to 48 hours.  If absolutely necessary, we could always try amlodipine again in the future.

## 2020-11-17 DIAGNOSIS — H35372 Puckering of macula, left eye: Secondary | ICD-10-CM | POA: Diagnosis not present

## 2020-11-17 DIAGNOSIS — H40013 Open angle with borderline findings, low risk, bilateral: Secondary | ICD-10-CM | POA: Diagnosis not present

## 2020-11-17 DIAGNOSIS — H43821 Vitreomacular adhesion, right eye: Secondary | ICD-10-CM | POA: Diagnosis not present

## 2020-11-17 DIAGNOSIS — Z961 Presence of intraocular lens: Secondary | ICD-10-CM | POA: Diagnosis not present

## 2020-11-17 DIAGNOSIS — M542 Cervicalgia: Secondary | ICD-10-CM | POA: Diagnosis not present

## 2020-11-17 DIAGNOSIS — H43813 Vitreous degeneration, bilateral: Secondary | ICD-10-CM | POA: Diagnosis not present

## 2020-11-22 ENCOUNTER — Telehealth: Payer: Self-pay

## 2020-11-22 NOTE — Telephone Encounter (Signed)
Lmom pt that we need to cancel and scheduled him correctly for lipid pharmd visit as he was scheduled for a new pt coumadin visit instead.

## 2020-11-23 ENCOUNTER — Ambulatory Visit: Payer: Medicare Other | Admitting: Cardiology

## 2020-11-23 NOTE — Telephone Encounter (Signed)
Appointment moved to pharmacy schedule on 11/30

## 2020-11-29 DIAGNOSIS — M542 Cervicalgia: Secondary | ICD-10-CM | POA: Diagnosis not present

## 2020-11-30 ENCOUNTER — Ambulatory Visit: Payer: Medicare Other

## 2020-12-08 ENCOUNTER — Encounter: Payer: Self-pay | Admitting: Pharmacist

## 2020-12-08 ENCOUNTER — Other Ambulatory Visit: Payer: Self-pay

## 2020-12-08 ENCOUNTER — Ambulatory Visit (INDEPENDENT_AMBULATORY_CARE_PROVIDER_SITE_OTHER): Payer: Medicare Other | Admitting: Pharmacist

## 2020-12-08 VITALS — BP 168/88 | HR 65

## 2020-12-08 DIAGNOSIS — I25110 Atherosclerotic heart disease of native coronary artery with unstable angina pectoris: Secondary | ICD-10-CM

## 2020-12-08 DIAGNOSIS — I10 Essential (primary) hypertension: Secondary | ICD-10-CM

## 2020-12-08 DIAGNOSIS — E782 Mixed hyperlipidemia: Secondary | ICD-10-CM

## 2020-12-08 MED ORDER — REPATHA SURECLICK 140 MG/ML ~~LOC~~ SOAJ: 1.0000 "pen " | SUBCUTANEOUS | 11 refills | Status: DC

## 2020-12-08 NOTE — Progress Notes (Signed)
Patient ID: Collin Morse                 DOB: 1951-01-04                    MRN: [id number]     HPI: Collin Morse is a 70 y.o. male patient referred to lipid clinic by Dr Marlou Porch. PMH is significant for CAD s/p CABG in 2011, HTN, HLD, GERD, hypothyroidism, and prostate cancer. Previously took amlodipine as antispasmodic of coronary arteries and esophagus. Stopped secondary to flushing, feeling cold, and difficulty sleeping. Irbesartan was increased to 150mg  daily at 10/26 visit with Dr Marlou Porch due to elevated BP of 158/90.  Pt presents today for follow up. Reports tolerating higher dose of irbesartan well. Home BP readings have ranged 140-150s/80-90s. Denies headache or blurred vision with elevated readings. Reports some white coat HTN. Eats out for meals frequently but tries to watch his salt. Ate [place] food earlier today but only had 1 cup of caffeinated tea. Typically has ~3 alcoholic drinks and caffeinated cups of tea each day. Stays active working in the yard and golfing. A1c was 6.5% at PCP office this summer. Takes Benadryl every night to help with sleeping. Splits Toprol doses, helps more with PVCs.  Current Medications: rosuvastatin 40mg  daily, ezetimibe 10mg  daily Risk Factors: CAD s/p CABG LDL goal: 70mg /dL; aggressive goal < 55  Diet: Trying to watch salt intake. 3 cups of tea a day, 3 alcoholic drinks each evening. Eats out a lot  Exercise: Walks 9 holes of golf a few times a week, works in the yard  Family History: Dementia in his mother; Diabetes type II in his father and mother; Heart Problems in his father; High blood pressure in his mother; Lung cancer in his mother.  Social History: Occasional alcohol use, denies drug and tobacco use.  Labs: 07/05/20: TC 134, TG 171, HDL 31, LDL 74 (rosuvastatin 40mg  daily, ezetimibe 10mg  daily)  Past Medical History:  Diagnosis Date   Coronary artery disease 2011   Coronary atherosclerosis of native coronary artery 12/18/2012   CABG  LIMA to LAD, SVG to circumflex 01/2009   Dizziness and giddiness    GERD (gastroesophageal reflux disease)    Hernia    HTN (hypertension) since 2008   Hyperlipemia    Hypothyroidism    Prostate cancer ([place])     Current Outpatient Medications on File Prior to Visit  Medication Sig Dispense Refill   aspirin EC 81 MG tablet Take 81 mg by mouth in the morning. Swallow whole.     clobetasol (OLUX) 0.05 % topical foam Apply 1 application topically 2 (two) times daily as needed (scalp flares psoriasis). 50 g 6   clopidogrel (PLAVIX) 75 MG tablet TAKE 1 TAB DAILY. PT TAKES MED IN THE PLACE OF ASPRIN WHEN HE GETS HIS ALLERGY SHOT IN [place]. 90 tablet 1   ezetimibe (ZETIA) 10 MG tablet TAKE 1 TABLET BY MOUTH EVERY DAY 90 tablet 3   irbesartan (AVAPRO) 150 MG tablet Take 1 tablet (150 mg total) by mouth daily. 90 tablet 1   metoprolol succinate (TOPROL-XL) 50 MG 24 hr tablet TAKE 1/2 TABLET BY MOUTH IN THE MORNING AND TAKE 1 TABLET BY MOUTH IN THE EVENING. 135 tablet 3   nitroGLYCERIN (NITROSTAT) 0.4 MG SL tablet PLACE 1 TABLET (0.4 MG TOTAL) UNDER THE TONGUE EVERY 5 (FIVE) MINUTES AS NEEDED FOR CHEST PAIN. 25 tablet 11   omeprazole (PRILOSEC) 20 MG capsule Take 20 mg  by mouth in the morning.     rosuvastatin (CRESTOR) 40 MG tablet TAKE 1 TABLET BY MOUTH EVERY DAY 90 tablet 3   vardenafil (LEVITRA) 20 MG tablet Take 20 mg by mouth daily as needed for erectile dysfunction.     No current facility-administered medications on file prior to visit.    Allergies  Allergen Reactions   Metformin And Related Diarrhea   Amlodipine Besylate Other (See Comments)    Flushing   Sulfa Antibiotics Nausea And Vomiting    Makes sick    Tetracyclines & Related Nausea And Vomiting    Makes sick    Assessment/Plan:  Hypertension - BP remains elevated above goal <130/22mmHg. Checking BMET today on dose increase of irbesartan. Currently taking irbesartan 150mg  daily and Toprol 25mg  AM and 50mg  PM. Prior side  effects with amlodipine. Pt recently had A1c of 6.5% so prefers to avoid meds that may increase glucose, will avoid thiazide diuretics because of this. If BMET is stable, will plan to start spironolactone 25mg  daily. Can increase irbesartan as well but discussed that based on elevation of current readings, additional agent for BP lowering will be needed. Will schedule f/u visit once med plan is determined tomorrow. Pt encouraged to limit salt to < 2,000mg  daily, aim for 150 minutes of activity each week, and limit caffeinated/alcoholic drinks to 2 per day. He will continue to monitor home BP readings and bring in cuff/readings to next visit.  Hyperlipidemia - LDL 74 on rosuvastatin 40mg  daily and ezetimibe 10mg  daily, above aggressive goal < 55. Discussed addition of Nexlizet, PCSK9i, or Leqvio today with pt. He prefers to try PCSK9i. Prior authorization for Repatha has been submitted and approved through 2025. Called his insurance after sending in rx to determine copay as it looks like he has a higher deductible/copay play. 1 month supply is $476. Will apply for [employer] to bring down cost. Will plan to continue rosuvastatin 40mg  daily but discontinue ezetimibe since combination of rosuvastatin and Repatha will bring LDL to goal. Will plan to recheck lipids ~2 months after starting Repatha which coincides with Jan visit with Dr Marlou Porch.   Viki Carrera E. Jazzalyn Loewenstein, PharmD, BCACP, [facility] [address] [place], [place] [address] Phone: [phone]; Fax: [phone] 12/08/2020 3:18 PM

## 2020-12-08 NOTE — Patient Instructions (Signed)
It was nice to meet you today  Cholesterol: Your LDL cholesterol is 74 and your goal is < 55  I will submit information to your insurance for Repatha and let you know when I hear back. If the copay is high, I'll apply for a grant through the [place] is a subcutaneous injection given once every 2 weeks in the fatty tissue of your stomach or upper outer thigh. Store the medication in the fridge. You can let your dose warm up to room temperature for 30 minutes before injecting if you prefer. Repatha will lower your LDL cholesterol by 60% and helps to lower your chance of having a heart attack or stroke.  Continue taking your rosuvastatin 40mg  daily. Once you start on Repatha, we would stop your ezetimibe  Blood pressure: Your blood pressure goal is < 130/57mmHg  We will check labs today and I'll call you tomorrow with results. We will likely add on a new medication to help lower your blood pressure  Aim for < 2,000mg  of daily sodium, limit your caffeine to 1-2 servings a day, and aim for 150 minutes of activity each week

## 2020-12-09 ENCOUNTER — Telehealth: Payer: Self-pay | Admitting: Pharmacist

## 2020-12-09 LAB — BASIC METABOLIC PANEL
BUN/Creatinine Ratio: 12 (ref 10–24)
BUN: 16 mg/dL (ref 8–27)
CO2: 21 mmol/L (ref 20–29)
Calcium: 9 mg/dL (ref 8.6–10.2)
Chloride: 103 mmol/L (ref 96–106)
Creatinine, Ser: 1.33 mg/dL — ABNORMAL HIGH (ref 0.76–1.27)
Glucose: 145 mg/dL — ABNORMAL HIGH (ref 70–99)
Potassium: 4.4 mmol/L (ref 3.5–5.2)
Sodium: 139 mmol/L (ref 134–144)
eGFR: 57 mL/min/{1.73_m2} — ABNORMAL LOW (ref >59)

## 2020-12-09 MED ORDER — SPIRONOLACTONE 25 MG PO TABS: 25.0000 mg | ORAL_TABLET | Freq: Every day | ORAL | 11 refills | Status: DC

## 2020-12-09 NOTE — Telephone Encounter (Signed)
Pt aware labs are stable. Will add spironolactone 25mg  daily for HTN, see OV yesterday for details. Follow up visit scheduled in 3 weeks.

## 2020-12-14 ENCOUNTER — Ambulatory Visit: Payer: Medicare Other | Admitting: Dermatology

## 2020-12-21 DIAGNOSIS — M542 Cervicalgia: Secondary | ICD-10-CM | POA: Diagnosis not present

## 2020-12-28 ENCOUNTER — Ambulatory Visit (INDEPENDENT_AMBULATORY_CARE_PROVIDER_SITE_OTHER): Payer: Medicare Other | Admitting: Pharmacist

## 2020-12-28 ENCOUNTER — Other Ambulatory Visit: Payer: Self-pay

## 2020-12-28 VITALS — BP 158/84 | HR 64

## 2020-12-28 DIAGNOSIS — I10 Essential (primary) hypertension: Secondary | ICD-10-CM | POA: Diagnosis not present

## 2020-12-28 DIAGNOSIS — E782 Mixed hyperlipidemia: Secondary | ICD-10-CM | POA: Diagnosis not present

## 2020-12-28 DIAGNOSIS — I25110 Atherosclerotic heart disease of native coronary artery with unstable angina pectoris: Secondary | ICD-10-CM

## 2020-12-28 NOTE — Patient Instructions (Addendum)
Your blood pressure goal is < 130/54mmHg  We'll check your lab work today and depending on the results, can either increase the dose of your irbesartan or spironolactone (or both)  Come fasting to you 1/20 appt with Dr Marlou Porch - we will plan to recheck your cholesterol then since starting the [place]. Continue taking your Repatha twice monthly and your rosuvastatin (Crestor) daily for your cholesterol

## 2020-12-28 NOTE — Progress Notes (Signed)
Patient ID: Collin Morse                 DOB: [birth year]                    MRN: [id number]     HPI: Collin Morse is a 70 y.o. male patient referred to lipid clinic by Dr Marlou Porch. PMH is significant for CAD s/p CABG in 2011, HTN, HLD, GERD, hypothyroidism, and prostate cancer. Previously took amlodipine as antispasmodic of coronary arteries and esophagus. Stopped secondary to flushing, feeling cold, and difficulty sleeping. Irbesartan was increased to 150mg  daily at 10/26 visit with Dr Marlou Porch due to elevated BP of 158/90. I saw pt 11/30 and started spironolactone 25mg  daily for HTN (BP remained elevated at 168/88) as well as Repatha for his hyperlipidemia.  Pt presents today for follow up. Reports tolerating spironolactone well. Denies dizziness and headaches. Home BP readings have remained elevated (see below). He brings his home bicep Omron cuff today. BP technique reviewed. Home cuff is measuring accurately - reading in clinic using his cuff was 161/85 vs 158/84 on my manual check. Takes meds at 7am, checks BP around 10am. Cut back on caffeine from 3 to 2 servings per day, still having about 3 alcoholic drinks per day. Stays active working in the yard and golfing. A1c was 6.5% at PCP office this summer. Takes Benadryl every night to help with sleeping. Splits Toprol doses, helps more with PVCs.  Highest reading was when he was at the dentist - 159/92  12/10 - 155/80, HR 57 12/11 - 147/82, HR 65 12/17 - 153/82, HR 67 12/18 - 127/74 12/19 - 140/72  Tolerating his Repatha well. Has given 2 doses so far, on Dec 1 and 15th. Had pharmacy fill his rx using [facility] - had high deductible to pay ($476 was quoted copay for 1 month supply) so ideally preferred to have pharmacy fill rx in January so that grant money would last longer but pt will be out of town right at the end of the year so it was easier to pick up earlier.  Current HTN meds: irbesartan 150mg  daily (AM), spironolactone 25mg   daily, Toprol 25mg  AM and 50mg  PM. Avoiding thiazides due to risk of hyperglycemia given A1c 6.5% Previously tried: amlodipine - flushing, feeling cold, difficulty sleeping BP goal: < 130/37mmHg  Current lipid meds: rosuvastatin 40mg  daily, Repatha 140mg  Q2W Risk factors: CAD s/p CABG LDL goal: 70mg /dL; aggressive goal < 55  Diet: Trying to watch salt intake. 3 cups of tea a day, 3 alcoholic drinks each evening. Eats out a lot  Exercise: Walks 9 holes of golf a few times a week, works in the yard  Family History: Dementia in his mother; Diabetes type II in his father and mother; Heart Problems in his father; High blood pressure in his mother; Lung cancer in his mother.  Social History: Occasional alcohol use, denies drug and tobacco use.  Labs: 07/05/20: TC 134, TG 171, HDL 31, LDL 74 (rosuvastatin 40mg  daily, ezetimibe 10mg  daily)  Past Medical History:  Diagnosis Date   Coronary artery disease 2011   Coronary atherosclerosis of native coronary artery 12/18/2012   CABG LIMA to LAD, SVG to circumflex 01/2009   Dizziness and giddiness    GERD (gastroesophageal reflux disease)    Hernia    HTN (hypertension) since 2008   Hyperlipemia    Hypothyroidism    Prostate cancer [facility])     Current Outpatient Medications on  File Prior to Visit  Medication Sig Dispense Refill   aspirin EC 81 MG tablet Take 81 mg by mouth in the morning. Swallow whole.     clobetasol (OLUX) 0.05 % topical foam Apply 1 application topically 2 (two) times daily as needed (scalp flares psoriasis). 50 g 6   clopidogrel (PLAVIX) 75 MG tablet TAKE 1 TAB DAILY. PT TAKES MED IN THE PLACE OF ASPRIN WHEN HE GETS HIS ALLERGY SHOT IN [place]. 90 tablet 1   Evolocumab (REPATHA SURECLICK) 509 MG/ML SOAJ Inject 1 pen into the skin every 14 (fourteen) days. 2 mL 11   irbesartan (AVAPRO) 150 MG tablet Take 1 tablet (150 mg total) by mouth daily. 90 tablet 1   metoprolol succinate (TOPROL-XL) 50 MG 24 hr tablet TAKE 1/2 TABLET  BY MOUTH IN THE MORNING AND TAKE 1 TABLET BY MOUTH IN THE EVENING. 135 tablet 3   nitroGLYCERIN (NITROSTAT) 0.4 MG SL tablet PLACE 1 TABLET (0.4 MG TOTAL) UNDER THE TONGUE EVERY 5 (FIVE) MINUTES AS NEEDED FOR CHEST PAIN. 25 tablet 11   omeprazole (PRILOSEC) 20 MG capsule Take 20 mg by mouth in the morning.     rosuvastatin (CRESTOR) 40 MG tablet TAKE 1 TABLET BY MOUTH EVERY DAY 90 tablet 3   spironolactone (ALDACTONE) 25 MG tablet Take 1 tablet (25 mg total) by mouth daily. 30 tablet 11   vardenafil (LEVITRA) 20 MG tablet Take 20 mg by mouth daily as needed for erectile dysfunction.     No current facility-administered medications on file prior to visit.    Allergies  Allergen Reactions   Metformin And Related Diarrhea   Amlodipine Besylate Other (See Comments)    Flushing   Sulfa Antibiotics Nausea And Vomiting    Makes sick    Tetracyclines & Related Nausea And Vomiting    Makes sick    Assessment/Plan:  Hypertension - BP remains elevated above goal <130/35mmHg. Checking BMET today with new spironolactone start. Depending on lab results, will plan to increase either irbesartan, spironolactone, or both. Continue Toprol 25mg  AM and 50mg  PM for PVCs, HR in the 60s. Prior side effects with amlodipine. Pt recently had A1c of 6.5% so prefers to avoid meds that may increase glucose, will avoid thiazide diuretics because of this.Will schedule f/u visit once med plan is determined tomorrow.   Hyperlipidemia - LDL 74 on rosuvastatin 40mg  daily and ezetimibe 10mg  daily, above aggressive goal < 55. At last visit, started pt on Repatha 140mg  Q2W, stopped ezetimibe, and continued rosuvastatin 40mg  daily. Will plan to recheck lipids ~2 months after starting Repatha which coincides with Jan visit with Dr Marlou Porch. Pt aware to come fasting to that appt.  Lachae Hohler E. Luzmaria Devaux, PharmD, BCACP, [facility] [address] [place],  [address] Phone: [phone]; Fax: [phone] 12/28/2020 7:55 AM

## 2020-12-29 ENCOUNTER — Telehealth: Payer: Self-pay | Admitting: Pharmacist

## 2020-12-29 DIAGNOSIS — I10 Essential (primary) hypertension: Secondary | ICD-10-CM

## 2020-12-29 LAB — BASIC METABOLIC PANEL
BUN/Creatinine Ratio: 16 (ref 10–24)
BUN: 21 mg/dL (ref 8–27)
CO2: 22 mmol/L (ref 20–29)
Calcium: 8.9 mg/dL (ref 8.6–10.2)
Chloride: 99 mmol/L (ref 96–106)
Creatinine, Ser: 1.28 mg/dL — ABNORMAL HIGH (ref 0.76–1.27)
Glucose: 151 mg/dL — ABNORMAL HIGH (ref 70–99)
Potassium: 4.4 mmol/L (ref 3.5–5.2)
Sodium: 137 mmol/L (ref 134–144)
eGFR: 60 mL/min/{1.73_m2} (ref >59)

## 2020-12-29 MED ORDER — IRBESARTAN 300 MG PO TABS: 300.0000 mg | ORAL_TABLET | Freq: Every day | ORAL | 5 refills | Status: DC

## 2020-12-29 NOTE — Telephone Encounter (Signed)
Pt returned call, he is aware to increase irbesartan to 300mg  daily. Will recheck BMET on Jan 5. If BMET stable then and home BP readings still elevated, will increase spironolactone to 50mg  daily. Pt sees Dr Marlou Porch on 1/20 for follow up and will have BP rechecked then and will recheck renal function then too.

## 2020-12-29 NOTE — Telephone Encounter (Signed)
BMET stable, baseline SCr ~ 1.3. Will likely need dose increase of both ARB and MRA but given some renal dysfunction, will first plan to increase irbesartan to 300mg  daily and recheck BMET in another 2 weeks. If stable, then will increase spironolactone to 50mg  daily.   Left message for pt to discuss.

## 2021-01-13 ENCOUNTER — Other Ambulatory Visit: Payer: Self-pay | Admitting: Cardiology

## 2021-01-13 ENCOUNTER — Other Ambulatory Visit: Payer: Self-pay

## 2021-01-13 ENCOUNTER — Other Ambulatory Visit: Payer: Medicare Other | Admitting: *Deleted

## 2021-01-13 DIAGNOSIS — I10 Essential (primary) hypertension: Secondary | ICD-10-CM | POA: Diagnosis not present

## 2021-01-13 DIAGNOSIS — M542 Cervicalgia: Secondary | ICD-10-CM | POA: Diagnosis not present

## 2021-01-13 LAB — BASIC METABOLIC PANEL
BUN/Creatinine Ratio: 13 (ref 10–24)
BUN: 17 mg/dL (ref 8–27)
CO2: 22 mmol/L (ref 20–29)
Calcium: 8.7 mg/dL (ref 8.6–10.2)
Chloride: 98 mmol/L (ref 96–106)
Creatinine, Ser: 1.32 mg/dL — ABNORMAL HIGH (ref 0.76–1.27)
Glucose: 229 mg/dL — ABNORMAL HIGH (ref 70–99)
Potassium: 4.1 mmol/L (ref 3.5–5.2)
Sodium: 133 mmol/L — ABNORMAL LOW (ref 134–144)
eGFR: 58 mL/min/{1.73_m2} — ABNORMAL LOW (ref >59)

## 2021-01-14 ENCOUNTER — Telehealth: Payer: Self-pay | Admitting: Pharmacist

## 2021-01-14 NOTE — Telephone Encounter (Signed)
BMET stable since increasing irbesartan to 300mg  daily. Tolerating dose increase well but hasn't been checking BP regularly since then, has only checked once and it was 140s/80s. Advised pt to start monitoring BP more regularly. Has follow up with Dr Marlou Porch on 1/20. Labs being rechecked then (recently started on Repatha). Can increase spironolactone dose if further BP lowering is needed at that time and if Na is stable on follow up labs.

## 2021-01-25 ENCOUNTER — Telehealth: Payer: Self-pay | Admitting: Cardiology

## 2021-01-25 ENCOUNTER — Ambulatory Visit (INDEPENDENT_AMBULATORY_CARE_PROVIDER_SITE_OTHER): Payer: Medicare Other | Admitting: Dermatology

## 2021-01-25 ENCOUNTER — Encounter: Payer: Self-pay | Admitting: Dermatology

## 2021-01-25 ENCOUNTER — Other Ambulatory Visit: Payer: Self-pay

## 2021-01-25 DIAGNOSIS — Z85828 Personal history of other malignant neoplasm of skin: Secondary | ICD-10-CM

## 2021-01-25 DIAGNOSIS — R238 Other skin changes: Secondary | ICD-10-CM | POA: Diagnosis not present

## 2021-01-25 DIAGNOSIS — I25709 Atherosclerosis of coronary artery bypass graft(s), unspecified, with unspecified angina pectoris: Secondary | ICD-10-CM | POA: Diagnosis not present

## 2021-01-25 DIAGNOSIS — L309 Dermatitis, unspecified: Secondary | ICD-10-CM

## 2021-01-25 DIAGNOSIS — L570 Actinic keratosis: Secondary | ICD-10-CM | POA: Diagnosis not present

## 2021-01-25 DIAGNOSIS — L820 Inflamed seborrheic keratosis: Secondary | ICD-10-CM | POA: Diagnosis not present

## 2021-01-25 DIAGNOSIS — Z1283 Encounter for screening for malignant neoplasm of skin: Secondary | ICD-10-CM | POA: Diagnosis not present

## 2021-01-25 NOTE — Telephone Encounter (Signed)
Patient called in to see if he still should take his medication before he get his blood drawn on Friday. Please advise

## 2021-01-25 NOTE — Telephone Encounter (Signed)
Spoke with pt and advised he may take morning medications with enough water to get them down safely and see he is seeing Dr Marlou Porch that morning as well he may want to bring a snack to have after he has blood drawn.  Pt verbalizes understanding and thanked Therapist, sports for the call.

## 2021-01-27 ENCOUNTER — Ambulatory Visit: Payer: Medicare Other | Admitting: Cardiology

## 2021-01-28 ENCOUNTER — Other Ambulatory Visit: Payer: Self-pay

## 2021-01-28 ENCOUNTER — Other Ambulatory Visit: Payer: Medicare Other | Admitting: *Deleted

## 2021-01-28 ENCOUNTER — Encounter: Payer: Self-pay | Admitting: Cardiology

## 2021-01-28 ENCOUNTER — Ambulatory Visit (INDEPENDENT_AMBULATORY_CARE_PROVIDER_SITE_OTHER): Payer: Medicare Other | Admitting: Cardiology

## 2021-01-28 VITALS — BP 120/70 | HR 60 | Ht 72.0 in | Wt 220.0 lb

## 2021-01-28 DIAGNOSIS — I25709 Atherosclerosis of coronary artery bypass graft(s), unspecified, with unspecified angina pectoris: Secondary | ICD-10-CM

## 2021-01-28 DIAGNOSIS — Z79899 Other long term (current) drug therapy: Secondary | ICD-10-CM | POA: Diagnosis not present

## 2021-01-28 DIAGNOSIS — E782 Mixed hyperlipidemia: Secondary | ICD-10-CM

## 2021-01-28 DIAGNOSIS — I10 Essential (primary) hypertension: Secondary | ICD-10-CM

## 2021-01-28 DIAGNOSIS — E119 Type 2 diabetes mellitus without complications: Secondary | ICD-10-CM

## 2021-01-28 LAB — COMPREHENSIVE METABOLIC PANEL
ALT: 53 IU/L — ABNORMAL HIGH (ref 0–44)
AST: 34 IU/L (ref 0–40)
Albumin/Globulin Ratio: 2.1 (ref 1.2–2.2)
Albumin: 4.8 g/dL — ABNORMAL HIGH (ref 3.7–4.7)
Alkaline Phosphatase: 44 IU/L (ref 44–121)
BUN/Creatinine Ratio: 12 (ref 10–24)
BUN: 15 mg/dL (ref 8–27)
Bilirubin Total: 0.7 mg/dL (ref 0.0–1.2)
CO2: 24 mmol/L (ref 20–29)
Calcium: 9.4 mg/dL (ref 8.6–10.2)
Chloride: 98 mmol/L (ref 96–106)
Creatinine, Ser: 1.25 mg/dL (ref 0.76–1.27)
Globulin, Total: 2.3 g/dL (ref 1.5–4.5)
Glucose: 159 mg/dL — ABNORMAL HIGH (ref 70–99)
Potassium: 4.5 mmol/L (ref 3.5–5.2)
Sodium: 136 mmol/L (ref 134–144)
Total Protein: 7.1 g/dL (ref 6.0–8.5)
eGFR: 62 mL/min/{1.73_m2} (ref >59)

## 2021-01-28 LAB — LIPID PANEL
Chol/HDL Ratio: 2.7 ratio (ref 0.0–5.0)
Cholesterol, Total: 93 mg/dL — ABNORMAL LOW (ref 100–199)
HDL: 34 mg/dL — ABNORMAL LOW (ref >39)
LDL Chol Calc (NIH): 26 mg/dL (ref 0–99)
Triglycerides: 212 mg/dL — ABNORMAL HIGH (ref 0–149)
VLDL Cholesterol Cal: 33 mg/dL (ref 5–40)

## 2021-01-28 NOTE — Assessment & Plan Note (Signed)
Catheterization 2022 resulted in medical management with patent LIMA and patent SVG to circumflex from bypass in 2011.  Continuing with goal-directed medical therapy.  No significant anginal symptoms.

## 2021-01-28 NOTE — Progress Notes (Signed)
Cardiology Office Note:    Date:  01/28/2021   ID:  Collin, Morse 10/02/1950, MRN [id number]  PCP:  Collin Stalker, PA-C   [facility] Providers Cardiologist:  Collin Furbish, MD     Referring MD: Collin Stalker, PA-C    History of Present Illness:    Collin Morse is a 71 y.o. male here for follow-up challenging hypertension, sees the hypertension clinic, CAD post CABG 2011.  Recently Collin Morse increased torsemide to 300 mg a day.  He also recently started PCSK9 inhibitor Repatha for hyperlipidemia.  Plan was to increase spironolactone further if needed and sodium stable on follow-up labs.  His serum sodium went down to 133 from 137.  Creatinine remains at 1.32.  His future son-in-law is a GI doctor, they discussed esophageal spasm.  He does admit that after taking the amlodipine for 2 days his symptoms seem to go away.  He thinks that they were associated with high stress events that usually occur once or twice a year.   He also increased his proton pump inhibitor.   He felt as though the amlodipine was causing more worry at night.  I put it on his medication intolerances.   He does take Cialis occasionally, vacations for instance.  This has implications for isosorbide.  Past Medical History:  Diagnosis Date   Coronary artery disease 2011   Coronary atherosclerosis of native coronary artery 12/18/2012   CABG LIMA to LAD, SVG to circumflex 01/2009   Dizziness and giddiness    GERD (gastroesophageal reflux disease)    Hernia    HTN (hypertension) since 2008   Hyperlipemia    Hypothyroidism    Prostate cancer [facility])     Past Surgical History:  Procedure Laterality Date   CATARACT EXTRACTION     Bilateral   CORONARY ARTERY BYPASS GRAFT  2011   GAS INSERTION Right 04/24/2016   Procedure: INSERTION OF GAS- SF6;  Surgeon: Collin Mullet, MD;  Location: [place];  Service: Ophthalmology;  Laterality: Right;   LASER PHOTO ABLATION Right 04/24/2016   Procedure: LASER  PHOTO ABLATION-ENDOLASER;  Surgeon: Collin Mullet, MD;  Location: [place];  Service: Ophthalmology;  Laterality: Right;   LEFT HEART CATH AND CORS/GRAFTS ANGIOGRAPHY N/A 10/11/2020   Procedure: LEFT HEART CATH AND CORS/GRAFTS ANGIOGRAPHY;  Surgeon: Collin Sine, MD;  Location: [place] [facility];  Service: Cardiovascular;  Laterality: N/A;   PARS PLANA VITRECTOMY Right 04/24/2016   Procedure: PARS PLANA VITRECTOMY WITH 25 GAUGE;  Surgeon: Collin Mullet, MD;  Location: [place];  Service: Ophthalmology;  Laterality: Right;   PROSTATE SURGERY  2006    Current Medications: Current Meds  Medication Sig   aspirin EC 81 MG tablet Take 81 mg by mouth in the morning. Swallow whole.   clobetasol (OLUX) 0.05 % topical foam Apply 1 application topically 2 (two) times daily as needed (scalp flares psoriasis).   clopidogrel (PLAVIX) 75 MG tablet TAKE 1 TAB DAILY. PT TAKES MED IN THE PLACE OF ASPRIN WHEN HE GETS HIS ALLERGY SHOT IN [place].   Evolocumab (REPATHA SURECLICK) 056 MG/ML SOAJ Inject 1 pen into the skin every 14 (fourteen) days.   irbesartan (AVAPRO) 300 MG tablet Take 1 tablet (300 mg total) by mouth daily.   metoprolol succinate (TOPROL-XL) 50 MG 24 hr tablet TAKE 1/2 TABLET BY MOUTH IN THE MORNING AND TAKE 1 TABLET BY MOUTH IN THE EVENING.   nitroGLYCERIN (NITROSTAT) 0.4 MG SL tablet PLACE 1 TABLET (0.4 MG TOTAL)  UNDER THE TONGUE EVERY 5 (FIVE) MINUTES AS NEEDED FOR CHEST PAIN.   omeprazole (PRILOSEC) 20 MG capsule Take 20 mg by mouth in the morning.   rosuvastatin (CRESTOR) 40 MG tablet TAKE 1 TABLET BY MOUTH EVERY DAY   spironolactone (ALDACTONE) 25 MG tablet Take 1 tablet (25 mg total) by mouth daily.   vardenafil (LEVITRA) 20 MG tablet Take 20 mg by mouth daily as needed for erectile dysfunction.     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 College education. Right handed. 3 children   Caffeine- two glasses daily ice tea.   Social Determinants of Health   Financial Resource Strain: Not on file  Food Insecurity: Not on file  Transportation Needs: Not on file  Physical Activity: Not on file  Stress: Not on file  Social Connections: Not on file     Family History: The patient's family history includes Dementia in his mother; Diabetes type II in his father and mother; Heart Problems in his father; High blood pressure in his mother; Lung cancer in his mother.  ROS:   Please see the history of present illness.     All other systems reviewed and are negative.  EKGs/Labs/Other Studies Reviewed:    The following studies were reviewed today: Stress test 2016-low risk no ischemia   Cardiac catheterization 2022: Widely patent LIMA graft supplying the mid LAD with proximal 30%, 70% stenoses and 90% stenosis proximal to the LIMA insertion site.   Widely patent SVG supplying the left circumflex obtuse marginal vessel.   Large dominant unbypassed RCA with mild luminal irregularity and 50% mid stenoses.   Normal LV function without focal segmental wall motion abnormality.  LVEDP 9 mmHg.   RECOMMENDATION: Medical therapy.  The patient has been on beta-blocker therapy.  With his recent  increased blood pressure, will add amlodipine for anti-ischemic, antispasm, as well as blood pressure benefit.  Continue aggressive lipid-lowering therapy with target LDL less than 70.  Diagnostic Dominance: Right   Recent Labs: 10/07/2020: Hemoglobin 14.6; Platelets 180 01/13/2021: BUN 17; Creatinine, Ser 1.32; Potassium 4.1; Sodium 133  Recent Lipid Panel    Component Value Date/Time   CHOL 134 03/12/2019 0908   TRIG 282 (H) 03/12/2019 0908   HDL 32 (L) 03/12/2019 0908   CHOLHDL 4.2 03/12/2019 0908   CHOLHDL 3 07/16/2014 0756   VLDL 18.4 07/16/2014 0756   LDLCALC 58 03/12/2019 0908     Risk Assessment/Calculations:              Physical Exam:    VS:  BP 120/70 (BP  Location: Left Arm, Patient Position: Sitting, Cuff Size: Normal)    Pulse 60    Ht 6' (1.829 m)    Wt 220 lb (99.8 kg)    SpO2 97%    BMI 29.84 kg/m     Wt Readings from Last 3 Encounters:  01/28/21 220 lb (99.8 kg)  11/03/20 219 lb (99.3 kg)  10/11/20 210 lb (95.3 kg)     GEN:  Well nourished, well developed in no acute distress HEENT: Normal NECK: No JVD; No carotid bruits LYMPHATICS: No lymphadenopathy CARDIAC: RRR, no murmurs, no rubs, gallops RESPIRATORY:  Clear to auscultation without rales, wheezing or rhonchi  ABDOMEN: Soft, non-tender, non-distended MUSCULOSKELETAL:  No edema; No deformity  SKIN: Warm and dry NEUROLOGIC:  Alert and oriented x 3 PSYCHIATRIC:  Normal affect   ASSESSMENT:    1. Coronary artery disease involving coronary bypass graft of native heart with angina pectoris ([place])   2. Essential hypertension   3. Mixed hyperlipidemia   4. Diabetes mellitus with coincident hypertension ([place])    PLAN:    In order of problems listed above:  Coronary artery disease involving coronary bypass graft of native heart with angina pectoris [facility]) Catheterization 2022 resulted in medical management with patent LIMA and patent SVG to circumflex from bypass in 2011.  Continuing with  goal-directed medical therapy.  No significant anginal symptoms.  Essential hypertension Challenging.  Amlodipine was stopped because of intolerance.  He is on irbesartan now up to 300 mg.  Toprol 50 mg.  We are checking a basic metabolic profile today.  Serum sodium went a little bit lower at last check on spironolactone 25 mg a day.  If this is reasonable today, and his blood pressure still remain elevated we will increase spironolactone.  Appreciate pharmacy lead clinic.  Mixed hyperlipidemia Just started PCSK9 inhibitor.  Appreciate lipid clinic.  Has been on Crestor 40 as well as Zetia in the past.  Now on Crestor and Repatha.  Diabetes mellitus with coincident hypertension (HCC) Prior hemoglobin A1c 6.5.  I will have him sit down with our pharmacy team to discuss [place].  He was unable to tolerate metformin previously.    We will see him back in 6 months     Medication Adjustments/Labs and Tests Ordered: Current medicines are reviewed at length with the patient today.  Concerns regarding medicines are outlined above.  No orders of the defined types were placed in this encounter.  No orders of the defined types were placed in this encounter.   There are no Patient Instructions on file for this visit.   Signed, Collin Furbish, MD  01/28/2021 8:36 AM    [facility]

## 2021-01-28 NOTE — Assessment & Plan Note (Addendum)
Challenging.  Amlodipine was stopped because of intolerance.  He is on irbesartan now up to 300 mg.  Toprol 50 mg.  We are checking a basic metabolic profile today.  Serum sodium went a little bit lower at last check on spironolactone 25 mg a day.  If this is reasonable today, and his blood pressure still remain elevated we will increase spironolactone.  Appreciate pharmacy lead clinic.

## 2021-01-28 NOTE — Patient Instructions (Signed)
Medication Instructions:  The current medical regimen is effective;  continue present plan and medications.  *If you need a refill on your cardiac medications before your next appointment, please call your pharmacy*   Lab Work: Please have blood work today (BMP)  If you have labs (blood work) drawn today and your tests are completely normal, you will receive your results only by: MyChart Message (if you have MyChart) OR A paper copy in the mail If you have any lab test that is abnormal or we need to change your treatment, we will call you to review the results.   You have been referred to see our pharmacy team to discuss starting [place].  Follow-Up: At [facility], you and your health needs are our priority.  As part of our continuing mission to provide you with exceptional heart care, we have created designated Provider Care Teams.  These Care Teams include your primary Cardiologist (physician) and Advanced Practice Providers (APPs -  Physician Assistants and Nurse Practitioners) who all work together to provide you with the care you need, when you need it.  We recommend signing up for the patient portal called "MyChart".  Sign up information is provided on this After Visit Summary.  MyChart is used to connect with patients for Virtual Visits (Telemedicine).  Patients are able to view lab/test results, encounter notes, upcoming appointments, etc.  Non-urgent messages can be sent to your provider as well.   To learn more about what you can do with MyChart, go to [email or web address].    Your next appointment:   6 month(s)  The format for your next appointment:   In Person  Provider:   Candee Furbish, MD     Thank you for choosing [facility]!!

## 2021-01-28 NOTE — Assessment & Plan Note (Signed)
Prior hemoglobin A1c 6.5.  I will have him sit down with our pharmacy team to discuss [place].  He was unable to tolerate metformin previously.

## 2021-01-28 NOTE — Assessment & Plan Note (Signed)
Just started PCSK9 inhibitor.  Appreciate lipid clinic.  Has been on Crestor 40 as well as Zetia in the past.  Now on Crestor and Repatha.

## 2021-02-01 DIAGNOSIS — M542 Cervicalgia: Secondary | ICD-10-CM | POA: Diagnosis not present

## 2021-02-14 DIAGNOSIS — M542 Cervicalgia: Secondary | ICD-10-CM | POA: Diagnosis not present

## 2021-02-14 NOTE — Progress Notes (Signed)
Patient ID: Collin Morse                 DOB: 01/13/1950                    MRN: [id number]    HPI: Collin Morse is a 71 y.o. male patient referred to pharmacy clinic by Dr. Marlou Porch to discuss initiating weight loss therapy with GLP1-RA. PMH is significant for obesity complicated by chronic medical conditions including T2DM, CAD s/p CABG, HTN, HLD. BMI today is 29.95. Previously followed by PharmD for HTN and lipids with excellent LDL response on Repatha down to 26.  Patient arrives in good spirits. He is interested in starting Ozempic to help his blood sugars and weight. He is intolerant of metformin (diarrhea). Last A1c was 6.5 on 07/05/20.   Current weight management medications: none  Previously tried meds: none  Current meds that may affect weight: none  Baseline weight/BMI: 220.8 lb (BMI 29.95)  Insurance payor: [place] is on formulary. Cost will likely be ~$45/month once deductible is met.   Diet: 3 meals/day -Breakfast: eggs -Lunch: chicken, occasional fast food burger -Dinner: wife cooks healthy meals -Snacks: popcorn -Drinks: trying to drink more water, drinks iced unsweet tea, beer or wine  Exercise: lawn work, walks playing golf twice/week, walks outside with wife but not as consistent with colder weather, thinking about joining a gym again  Family History: Dementia in his mother; Diabetes type II in his father and mother; Heart Problems in his father; High blood pressure in his mother; Lung cancer in his mother.  Social History: Never smoker  Labs: Lab Results  Component Value Date   HGBA1C 6.0 05/15/2013    Wt Readings from Last 1 Encounters:  01/28/21 220 lb (99.8 kg)    BP Readings from Last 1 Encounters:  01/28/21 120/70   Pulse Readings from Last 1 Encounters:  01/28/21 60       Component Value Date/Time   CHOL 93 (L) 01/28/2021 0845   TRIG 212 (H) 01/28/2021 0845   HDL 34 (L) 01/28/2021 0845   CHOLHDL 2.7 01/28/2021 0845    CHOLHDL 3 07/16/2014 0756   VLDL 18.4 07/16/2014 0756   LDLCALC 26 01/28/2021 0845    Past Medical History:  Diagnosis Date   Coronary artery disease 2011   Coronary atherosclerosis of native coronary artery 12/18/2012   CABG LIMA to LAD, SVG to circumflex 01/2009   Dizziness and giddiness    GERD (gastroesophageal reflux disease)    Hernia    HTN (hypertension) since 2008   Hyperlipemia    Hypothyroidism    Prostate cancer ([place])     Current Outpatient Medications on File Prior to Visit  Medication Sig Dispense Refill   aspirin EC 81 MG tablet Take 81 mg by mouth in the morning. Swallow whole.     clobetasol (OLUX) 0.05 % topical foam Apply 1 application topically 2 (two) times daily as needed (scalp flares psoriasis). 50 g 6   clopidogrel (PLAVIX) 75 MG tablet TAKE 1 TAB DAILY. PT TAKES MED IN THE PLACE OF ASPRIN WHEN HE GETS HIS ALLERGY SHOT IN [place]. 90 tablet 1   Evolocumab (REPATHA SURECLICK) 947 MG/ML SOAJ Inject 1 pen into the skin every 14 (fourteen) days. 2 mL 11   irbesartan (AVAPRO) 300 MG tablet Take 1 tablet (300 mg total) by mouth daily. 30 tablet 5   metoprolol succinate (TOPROL-XL) 50 MG 24 hr tablet TAKE  1/2 TABLET BY MOUTH IN THE MORNING AND TAKE 1 TABLET BY MOUTH IN THE EVENING. 135 tablet 3   nitroGLYCERIN (NITROSTAT) 0.4 MG SL tablet PLACE 1 TABLET (0.4 MG TOTAL) UNDER THE TONGUE EVERY 5 (FIVE) MINUTES AS NEEDED FOR CHEST PAIN. 25 tablet 11   omeprazole (PRILOSEC) 20 MG capsule Take 20 mg by mouth in the morning.     rosuvastatin (CRESTOR) 40 MG tablet TAKE 1 TABLET BY MOUTH EVERY DAY 90 tablet 3   spironolactone (ALDACTONE) 25 MG tablet Take 1 tablet (25 mg total) by mouth daily. 30 tablet 11   vardenafil (LEVITRA) 20 MG tablet Take 20 mg by mouth daily as needed for erectile dysfunction.     No current facility-administered medications on file prior to visit.    Allergies  Allergen Reactions   Metformin And Related Diarrhea   Amlodipine Besylate Other  (See Comments)    Flushing   Sulfa Antibiotics Nausea And Vomiting    Makes sick    Tetracyclines & Related Nausea And Vomiting    Makes sick     Assessment/Plan:  1. Type 2 Diabetes/Weight Loss - Most recent A1c 6.5. Patient has not tolerated metformin in the past due to diarrhea. Patient has also not met goal of at least 5% of body weight loss with comprehensive lifestyle modifications alone in the past 3-6 months. Will start Ozempic. Confirmed patient has no personal or family history of medullary thyroid carcinoma (MTC) or Multiple Endocrine Neoplasia syndrome type 2 (MEN 2).   Advised patient on common side effects including nausea, diarrhea, dyspepsia, decreased appetite, and fatigue. Counseled patient on reducing meal size and how to titrate medication to minimize side effects. Counseled patient to call if intolerable side effects or if experiencing dehydration, abdominal pain, or dizziness. Patient will adhere to dietary modifications and will target at least 150 minutes of moderate intensity exercise weekly. Educated patient on how to use Ozempic pen and he confirmed understanding.   GLP1 Agonist Titration Plan:  Will plan to follow the titration plan as below, pending patient is tolerating each dose before increasing to the next. Can slow titration if needed for tolerability.   -Month 1: Inject Ozempic 0.25 mg SQ once weekly x 4 weeks -Month 2: Inject Ozempic 0.5 mg  SQ once weekly x 4 weeks -Month 3: Inject Ozempic 1 mg SQ once weekly x 4 weeks -Month 4+: Inject Ozempic 2 mg SQ once weekly   Follow up in 4 weeks by phone.   Rebbeca Paul, PharmD PGY2 Ambulatory Care Pharmacy Resident 02/15/2021 10:59 AM

## 2021-02-15 ENCOUNTER — Ambulatory Visit (INDEPENDENT_AMBULATORY_CARE_PROVIDER_SITE_OTHER): Payer: Medicare Other | Admitting: Student-PharmD

## 2021-02-15 ENCOUNTER — Other Ambulatory Visit: Payer: Self-pay

## 2021-02-15 VITALS — BP 132/76 | HR 64 | Wt 220.8 lb

## 2021-02-15 DIAGNOSIS — I10 Essential (primary) hypertension: Secondary | ICD-10-CM | POA: Diagnosis not present

## 2021-02-15 DIAGNOSIS — E119 Type 2 diabetes mellitus without complications: Secondary | ICD-10-CM | POA: Diagnosis not present

## 2021-02-15 MED ORDER — OZEMPIC (0.25 OR 0.5 MG/DOSE) 2 MG/1.5ML ~~LOC~~ SOPN: PEN_INJECTOR | SUBCUTANEOUS | 1 refills | Status: DC

## 2021-02-15 NOTE — Patient Instructions (Signed)
GLP1 Agonist Titration Plan for Ozempic:  Will plan to follow the titration plan as below, pending patient is tolerating each dose before increasing to the next. Can slow titration if needed for tolerability.   -Month 1: Inject Ozempic 0.25 mg SQ once weekly x 4 weeks -Month 2: Inject Ozempic 0.5 mg  SQ once weekly x 4 weeks -Month 3: Inject Ozempic 1 mg SQ once weekly x 4 weeks -Month 4+: Inject Ozempic 2 mg SQ once weekly  GLP-1 Receptor Agonist Counseling Points This medication reduces your appetite and may make you feel fuller longer.  Stop eating when your body tells you that you are full. This will likely happen sooner than you are used to. Store your medication in the fridge until you are ready to use it. Inject your medication in the fatty tissue of your lower abdominal area (2 inches away from belly button) or upper outer thigh. Rotate injection sites. Each pen will last you about 1 month (the first month it will last a few weeks longer). Use a different needle with each weekly injection. Common side effects include: nausea, diarrhea/constipation, and heartburn, and are more likely to occur if you overeat.  Tips for living a healthier life    Building a Healthy and Balanced Diet Make most of your meal vegetables and fruits -  of your plate. Aim for color and variety, and remember that potatoes dont count as vegetables on the Healthy Eating Plate because of their negative impact on blood sugar.  Go for whole grains -  of your plate. Whole and intact grains--whole wheat, barley, wheat berries, quinoa, oats, brown rice, and foods made with them, such as whole wheat pasta--have a milder effect on blood sugar and insulin than white bread, white rice, and other refined grains.  Protein power -  of your plate. Fish, poultry, beans, and nuts are all healthy, versatile protein sources--they can be mixed into salads, and pair well with vegetables on a plate. Limit red meat, and avoid  processed meats such as bacon and sausage.  Healthy plant oils - in moderation. Choose healthy vegetable oils like olive, canola, soy, corn, sunflower, peanut, and others, and avoid partially hydrogenated oils, which contain unhealthy trans fats. Remember that low-fat does not mean healthy.  Drink water, coffee, or tea. Skip sugary drinks, limit milk and dairy products to one to two servings per day, and limit juice to a small glass per day.  Stay active. The red figure running across the [place] is a reminder that staying active is also important in weight control.  The main message of the Healthy Eating Plate is to focus on diet quality:  The type of carbohydrate in the diet is more important than the amount of carbohydrate in the diet, because some sources of carbohydrate--like vegetables (other than potatoes), fruits, whole grains, and beans--are healthier than others. The Healthy Eating Plate also advises consumers to avoid sugary beverages, a major source of calories--usually with little nutritional value--in the American diet. The Healthy Eating Plate encourages consumers to use healthy oils, and it does not set a maximum on the percentage of calories people should get each day from healthy sources of fat. In this way, the Healthy Eating Plate recommends the opposite of the low-fat message promoted for decades by the USDA.  DeskDistributor.no  SUGAR  Sugar is a huge problem in the modern day diet. Sugar is a big contributor to heart disease, diabetes, high triglyceride levels, fatty liver disease  and obesity. Sugar is hidden in almost all packaged foods/beverages. Added sugar is extra sugar that is added beyond what is naturally found and has no nutritional benefit for your body. The American Heart Association recommends limiting added sugars to no more than 25g for women and 36 grams for men per day. There are many  names for sugar including maltose, sucrose (names ending in "ose"), high fructose corn syrup, molasses, cane sugar, corn sweetener, raw sugar, syrup, honey or fruit juice concentrate.   One of the best ways to limit your added sugars is to stop drinking sweetened beverages such as soda, sweet tea, and fruit juice.  There is 65g of added sugars in one 20oz bottle of Coke! That is equal to 7.5 donuts.   Pay attention and read all nutrition facts labels. Below is an examples of a nutrition facts label. The #1 is showing you the total sugars where the # 2 is showing you the added sugars. This one serving has almost the max amount of added sugars per day!     20 oz Soda 65g Sugar = 7.5 Glazed Donuts  16oz Energy  Drink 54g Sugar = 6.5 Glazed Donuts  Large Sweet  Tea 38g Sugar = 4 Glazed Donuts  20oz Sports  Drink 34g Sugar = 3.5 Glazed Donuts  8oz Chocolate Milk 24g Sugar =2.5 Glazed Donuts  8oz Orange  Juice 21g Sugar = 2 Glazed Donuts  1 Juice Box 14g Sugar = 1.5 Glazed Donuts  16oz Water= NO SUGAR!!  EXERCISE  Exercise is good. Weve all heard that. In an ideal world, we would all have time and resources to get plenty of it. When you are active, your heart pumps more efficiently and you will feel better.  Multiple studies show that even walking regularly has benefits that include living a longer life. The American Heart Association recommends 150 minutes per week of exercise (30 minutes per day most days of the week). You can do this in any increment you wish. Nine or more 10-minute walks count. So does an hour-long exercise class. Break the time apart into what will work in your life. Some of the best things you can do include walking briskly, jogging, cycling or swimming laps. Not everyone is ready to exercise. Sometimes we need to start with just getting active. Here are some easy ways to be more active throughout the day:  Take the stairs instead of the elevator  Go for  a 10-15 minute walk during your lunch break (find a friend to make it more enjoyable)  When shopping, park at the back of the parking lot  If you take public transportation, get off one stop early and walk the extra distance  Pace around while making phone calls  Check with your doctor if you arent sure what your limitations may be. Always remember to drink plenty of water when doing any type of exercise. Dont feel like a failure if youre not getting the 90-150 minutes per week. If you started by being a couch potato, then just a 10-minute walk each day is a huge improvement. Start with little victories and work your way up.   HEALTHY EATING TIPS  When looking to improve your eating habits, whether to lose weight, lower blood pressure or just be healthier, it helps to know what a serving size is.   Grains 1 slice of bread,  bagel,  cup pasta or rice  Vegetables 1 cup fresh or raw vegetables,  cup cooked  or canned Fruits 1 piece of medium sized fruit,  cup canned,   Meats/Proteins  cup dried       1 oz meat, 1 egg,  cup cooked beans, nuts or seeds  Dairy        Fats Individual yogurt container, 1 cup (8oz)    1 teaspoon margarine/butter or vegetable  milk or milk alternative, 1 slice of cheese          oil; 1 tablespoon mayonnaise or salad dressing                  Plan ahead: make a menu of the meals for a week then create a grocery list to go with that menu. Consider meals that easily stretch into a night of leftovers, such as stews or casseroles. Or consider making two of your favorite meal and put one in the freezer for another night. Try a night or two each week that is meatless or no cook such as salads. When you get home from the grocery store wash and prepare your vegetables and fruits. Then when you need them they are ready to go.   Tips for going to the grocery store:  [place] store or generic brands  Check the weekly ad from your store on-line or in their in-store  flyer  Look at the unit price on the shelf tag to compare/contrast the costs of different items  Buy fruits/vegetables in season  Carrots, bananas and apples are low-cost, naturally healthy items  If meats or frozen vegetables are on sale, buy some extras and put in your freezer  Limit buying prepared or ready to eat items, even if they are pre-made salads or fruit snacks  Do not shop when youre hungry  Foods at eye level tend to be more expensive. Look on the high and low shelves for deals.  Consider shopping at the farmers market for fresh foods in season.  Avoid the cookie and chip aisles (these are expensive, high in calories and low in nutritional value). Shop on the outside of the grocery store.  Healthy food preparations:  If you cant get lean hamburger, be sure to drain the fat when cooking  Steam, saut (in olive oil), grill or bake foods  Experiment with different seasonings to avoid adding salt to your foods. Kosher salt, sea salt and Himalayan salt are all still salt and should be avoided. Try seasoning food with onion, garlic, thyme, rosemary, basil ect. Onion powder or garlic powder is ok. Avoid if it says salt (ie garlic salt).

## 2021-02-16 ENCOUNTER — Encounter: Payer: Self-pay | Admitting: Dermatology

## 2021-02-16 NOTE — Progress Notes (Signed)
° °  Follow-Up Visit   Subjective  Collin Morse is a 71 y.o. male who presents for the following: Annual Exam (No new concerns).  General skin examination, history of skin cancers Location:  Duration:  Quality:  Associated Signs/Symptoms: Modifying Factors:  Severity:  Timing: Context:   Objective  Well appearing patient in no apparent distress; mood and affect are within normal limits. Chest - Medial [facility]), Generalized Chest, stomach, left leg: Soft blue 3 mm dermal papules, typical dermoscopy  Scalp General skin examination, no atypical pigmented lesions, no recurrent melanoma or nonmelanoma skin cancer.  Right Dorsal Hand Hornlike 3 mm pink papule  Left Forearm - Posterior Small patch of dermatitis, likely contact.  Left Leg, Right Leg Pink flattopped 5 mm papule    A full examination was performed including scalp, head, eyes, ears, nose, lips, neck, chest, axillae, abdomen, back, buttocks, bilateral upper extremities, bilateral lower extremities, hands, feet, fingers, toes, fingernails, and toenails. All findings within normal limits unless otherwise noted below.   Assessment & Plan    Encounter for screening for malignant neoplasm of skin Scalp  Annual skin examination, encouraged to self examine with spouse twice annually  AK (actinic keratosis) Right Dorsal Hand  Destruction of lesion - Right Dorsal Hand Complexity: simple   Destruction method: cryotherapy   Informed consent: discussed and consent obtained   Timeout:  patient name, date of birth, surgical site, and procedure verified Lesion destroyed using liquid nitrogen: Yes   Cryotherapy cycles:  3 Outcome: patient tolerated procedure well with no complications   Post-procedure details: wound care instructions given    Venous [place] (2) Chest - Medial (Center); Generalized  No intervention necessary  Eczema, unspecified type Left Forearm - Posterior  Contact me if this  worsens  Seborrheic keratosis, inflamed (2) Left Leg; Right Leg  Destruction of lesion - Left Leg, Right Leg Complexity: simple   Destruction method: cryotherapy   Informed consent: discussed and consent obtained   Lesion destroyed using liquid nitrogen: Yes   Cryotherapy cycles:  3 Outcome: patient tolerated procedure well with no complications        I, Lavonna Monarch, MD, have reviewed all documentation for this visit.  The documentation on 02/16/21 for the exam, diagnosis, procedures, and orders are all accurate and complete.

## 2021-03-01 DIAGNOSIS — M542 Cervicalgia: Secondary | ICD-10-CM | POA: Diagnosis not present

## 2021-03-03 ENCOUNTER — Telehealth: Payer: Self-pay | Admitting: Pharmacist

## 2021-03-03 NOTE — Telephone Encounter (Signed)
Returned call to pt. States he has an allergy shot once a year and for 2-3 days before and 30 days after he has to change to Plavix? Already has a prescription for this on file. Had his allergy shot yesterday and feels bad like his heart is skipping beats and he's having cramps in his feet. Also reports he changed his diet at the same time where he drank 1.5 gallons of water for a few days, cut out alcohol and caffeine completely recently and is rarely eating carbs. BP was 117/67, previously in the 130s. Advised pt these sx should not be coming from his meds and instead sound like sx from his allergy shot and from completely cutting out caffeine and alcohol when he was drinking a decent amount of each previously. He'll continue on his current meds and continue to monitor for symptom improvement.

## 2021-03-03 NOTE — Telephone Encounter (Signed)
Patient called and wanted to talk with [facility] about blood thinner

## 2021-03-08 ENCOUNTER — Other Ambulatory Visit: Payer: Self-pay | Admitting: Cardiology

## 2021-03-11 ENCOUNTER — Other Ambulatory Visit: Payer: Self-pay | Admitting: Cardiology

## 2021-03-15 ENCOUNTER — Telehealth: Payer: Self-pay | Admitting: Student-PharmD

## 2021-03-15 DIAGNOSIS — M542 Cervicalgia: Secondary | ICD-10-CM | POA: Diagnosis not present

## 2021-03-15 NOTE — Telephone Encounter (Signed)
Called patient to see how he is tolerating Ozempic. Took 4th dose of 0.25 mg on Sunday. He is not seeing any weight loss yet. Feels tighter in stomach. Reminded that as we increase the dose he will start to see more weight loss. We start at a low dose and titrate slowly for tolerability. Tightness in stomach is likely related to how food is slowed down through the GI tract, may feel fuller or have bloating. He denies any adverse effects and would like to increase the dose to 0.5 mg. Will call him in a month when he is due to increase to 1 mg to confirm continued tolerability.  ?

## 2021-03-29 DIAGNOSIS — M542 Cervicalgia: Secondary | ICD-10-CM | POA: Diagnosis not present

## 2021-04-01 ENCOUNTER — Other Ambulatory Visit: Payer: Self-pay | Admitting: Cardiology

## 2021-04-11 DIAGNOSIS — K219 Gastro-esophageal reflux disease without esophagitis: Secondary | ICD-10-CM | POA: Diagnosis not present

## 2021-04-11 DIAGNOSIS — K2270 Barrett's esophagus without dysplasia: Secondary | ICD-10-CM | POA: Diagnosis not present

## 2021-04-11 DIAGNOSIS — Z86010 Personal history of colonic polyps: Secondary | ICD-10-CM | POA: Diagnosis not present

## 2021-04-12 ENCOUNTER — Telehealth: Payer: Self-pay | Admitting: Student-PharmD

## 2021-04-12 MED ORDER — OZEMPIC (0.25 OR 0.5 MG/DOSE) 2 MG/1.5ML ~~LOC~~ SOPN: 0.5000 mg | PEN_INJECTOR | SUBCUTANEOUS | 11 refills | Status: DC

## 2021-04-12 NOTE — Telephone Encounter (Signed)
Called pt back. Ozempic cost more than doubled, sounds like he's in the donut hole. Advised him to confirm with his insurance, we do not have a way to make copay cheaper. ? ?Has been having muscular issues for the past 2 months, tightness in his lower back that's affected his golf game. Skipped Sunday's Repatha dose and golfed better today, but then took a Repatha dose today. Also has a neck issue that he sees PT for so isn't sure if issues are related to that instead. ? ?Advised pt to skip the next 1-2 doses of Repatha and monitor for sx improvement. Can rechallenge after to see if sx return. If so, would stop Repatha and replace with Nexlizet (ezetimibe only stopped since he didn't need triple lipid lowering therapy). He will continue on his rosuvastatin which he's tolerated well for years. He'll call clinic with an update in a few weeks. ?

## 2021-04-12 NOTE — Telephone Encounter (Signed)
Called patient to see how he is doing on Ozempic 0.5 mg weekly. States that he would like to stay on this dose as it has cut his appetite in half and he doesn't want it to decrease any further. Denies any other adverse effects. He does want to stay on treatment to help his blood sugars. Will send in refills for this. Also reports that the cost has gone up to $164 when he tried to fill it this month so his pharmacy was reaching out to his insurance to determine why the cost went up. States that it has not been this high so far this year. He still uses the [employer] [facility] [employer] and takes no other branded medications.  ? ?He also requests to speak to [place] who he saw in November and started Repatha. He reports feeling tight in his neck and back lately. He does report having a problem with his neck that he sees physical therapy for and they commented that they had not seen his neck and back so tight. He feels that it started happening after starting Repatha. He also spoke with his brother who is a Therapist, music and he thought it could be related to [place]. 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 He requests a call back preferably after 4pm today.  ?

## 2021-04-13 ENCOUNTER — Other Ambulatory Visit: Payer: Self-pay | Admitting: Cardiology

## 2021-04-13 DIAGNOSIS — M542 Cervicalgia: Secondary | ICD-10-CM | POA: Diagnosis not present

## 2021-04-19 NOTE — Telephone Encounter (Signed)
Received a fax that tier exception for ozempic was denied. Called pt and left detailed VM per DPR. Unsure who submitted the tier exception. ?

## 2021-05-02 DIAGNOSIS — R059 Cough, unspecified: Secondary | ICD-10-CM | POA: Diagnosis not present

## 2021-05-03 DIAGNOSIS — M542 Cervicalgia: Secondary | ICD-10-CM | POA: Diagnosis not present

## 2021-05-09 DIAGNOSIS — C61 Malignant neoplasm of prostate: Secondary | ICD-10-CM | POA: Diagnosis not present

## 2021-05-10 DIAGNOSIS — M542 Cervicalgia: Secondary | ICD-10-CM | POA: Diagnosis not present

## 2021-05-26 DIAGNOSIS — M542 Cervicalgia: Secondary | ICD-10-CM | POA: Diagnosis not present

## 2021-06-02 ENCOUNTER — Ambulatory Visit (INDEPENDENT_AMBULATORY_CARE_PROVIDER_SITE_OTHER): Payer: Medicare Other

## 2021-06-02 ENCOUNTER — Encounter: Payer: Self-pay | Admitting: Podiatry

## 2021-06-02 ENCOUNTER — Ambulatory Visit (INDEPENDENT_AMBULATORY_CARE_PROVIDER_SITE_OTHER): Payer: Medicare Other | Admitting: Podiatry

## 2021-06-02 DIAGNOSIS — I25709 Atherosclerosis of coronary artery bypass graft(s), unspecified, with unspecified angina pectoris: Secondary | ICD-10-CM | POA: Diagnosis not present

## 2021-06-02 DIAGNOSIS — L84 Corns and callosities: Secondary | ICD-10-CM | POA: Diagnosis not present

## 2021-06-02 DIAGNOSIS — D169 Benign neoplasm of bone and articular cartilage, unspecified: Secondary | ICD-10-CM | POA: Diagnosis not present

## 2021-06-02 DIAGNOSIS — M779 Enthesopathy, unspecified: Secondary | ICD-10-CM

## 2021-06-02 DIAGNOSIS — D689 Coagulation defect, unspecified: Secondary | ICD-10-CM

## 2021-06-02 NOTE — Progress Notes (Signed)
Subjective:   Patient ID: Collin Morse, male   DOB: 71 y.o.   MRN: [id number]   HPI Patient presents stating my little toe has really been hurting me and it did well for a good period of time and now has come back full throttle and is difficult to wear shoe gear with.  Patient is also on blood thinner currently does not smoke likes to be active   Review of Systems  All other systems reviewed and are negative.      Objective:  Physical Exam Vitals and nursing note reviewed.  Constitutional:      Appearance: He is well-developed.  Pulmonary:     Effort: Pulmonary effort is normal.  Musculoskeletal:        General: Normal range of motion.  Skin:    General: Skin is warm.  Neurological:     Mental Status: He is alert.    Neurovascular status intact muscle strength found to be adequate range of motion adequate.  Patient is noted to have a hyperkeratotic lesion digit 5 right with thickness with rotation of the toe and pain with pressure.  Patient is found to have good digital perfusion well oriented x3     Assessment:  At risk patient with corn callus formation digit 5 right painful when pressed with rotation of the toe pressing against the fourth toe creating interspace discomfort     Plan:  H&P x-ray reviewed condition explained to patient with detailed explanation of possible surgery to solve this problem.  Today I did sharp sterile debridement of the lesion no iatrogenic bleeding applied sterile dressing instructed on wider shoes soaks and cushioning and reappoint as needed  X-rays indicate rotation of a significant nature fifth digit right pressing against the fourth toe

## 2021-06-14 DIAGNOSIS — K429 Umbilical hernia without obstruction or gangrene: Secondary | ICD-10-CM | POA: Diagnosis not present

## 2021-06-14 DIAGNOSIS — E782 Mixed hyperlipidemia: Secondary | ICD-10-CM | POA: Diagnosis not present

## 2021-06-14 DIAGNOSIS — E1159 Type 2 diabetes mellitus with other circulatory complications: Secondary | ICD-10-CM | POA: Diagnosis not present

## 2021-06-14 DIAGNOSIS — I2510 Atherosclerotic heart disease of native coronary artery without angina pectoris: Secondary | ICD-10-CM | POA: Diagnosis not present

## 2021-06-14 DIAGNOSIS — K2270 Barrett's esophagus without dysplasia: Secondary | ICD-10-CM | POA: Diagnosis not present

## 2021-06-14 DIAGNOSIS — E039 Hypothyroidism, unspecified: Secondary | ICD-10-CM | POA: Diagnosis not present

## 2021-06-14 DIAGNOSIS — Z1389 Encounter for screening for other disorder: Secondary | ICD-10-CM | POA: Diagnosis not present

## 2021-06-14 DIAGNOSIS — Z0000 Encounter for general adult medical examination without abnormal findings: Secondary | ICD-10-CM | POA: Diagnosis not present

## 2021-06-16 DIAGNOSIS — M542 Cervicalgia: Secondary | ICD-10-CM | POA: Diagnosis not present

## 2021-06-27 ENCOUNTER — Telehealth: Payer: Self-pay | Admitting: *Deleted

## 2021-06-27 NOTE — Telephone Encounter (Signed)
Patient is calling because his callus that was shaved 3 weeks ago is starting to bother him, inflamed and hard to walk on. Please schedule for f/u appointment.

## 2021-06-27 NOTE — Telephone Encounter (Signed)
Called pt and he is just wanting some advise possibly. He said he was on his feet a lot this weekend  and thinks that may have caused it to be more painful. He  is wanting to know if there is any soaking instructions or advise that he can get to help relieve some of the pain.

## 2021-06-28 DIAGNOSIS — N5231 Erectile dysfunction following radical prostatectomy: Secondary | ICD-10-CM | POA: Diagnosis not present

## 2021-06-28 DIAGNOSIS — C61 Malignant neoplasm of prostate: Secondary | ICD-10-CM | POA: Diagnosis not present

## 2021-06-29 DIAGNOSIS — M542 Cervicalgia: Secondary | ICD-10-CM | POA: Diagnosis not present

## 2021-06-29 NOTE — Telephone Encounter (Signed)
Can try epson salt soaks

## 2021-06-30 NOTE — Telephone Encounter (Signed)
Notified pt to try epson salt soaks. He stated he has been soaking it but not using the epson salt but will try that. He said it is feeling a little better he just has to find loose enough shoes to not put pressure on that toe. He is scheduled to see  you 6.29 but will call and cancel if feeling better. He said thank you for calling.

## 2021-07-06 ENCOUNTER — Ambulatory Visit: Payer: Self-pay | Admitting: General Surgery

## 2021-07-06 DIAGNOSIS — K432 Incisional hernia without obstruction or gangrene: Secondary | ICD-10-CM | POA: Diagnosis not present

## 2021-07-06 NOTE — H&P (Signed)
Chief Complaint: Hernia       History of Present Illness: Collin Morse is a 71 y.o. male who is seen today as an office consultation at the request of Dr. Wharton for evaluation of Hernia .   Patient is a 71-year-old male who comes in secondary to an incisional midline hernia.  This is just above the umbilicus.   Patient had a previous robotic prostatectomy in the past.  Patient also had a previous CABG in 2011.  Patient sees Dr. Skains is cardiologist.   Patient states that he has had the hernia over the last 4 years.  He states it is gotten larger.  He states he is currently on Ozempic and has lost weight and become more noticeable.   He had no significant pain to the area no signs or symptoms of incarceration or strangulation.   Patient is otherwise active outside with gardening and golfing.       Review of Systems: A complete review of systems was obtained from the patient.  I have reviewed this information and discussed as appropriate with the patient.  See HPI as well for other ROS.   ROS      Medical History: Past Medical History Past Medical History: Diagnosis        Date            GERD (gastroesophageal reflux disease)                   History of cancer                 There is no problem list on file for this patient.     Past Surgical History History reviewed. No pertinent surgical history.     Allergies Allergies Allergen           Reactions            Sulfa (Sulfonamide Antibiotics)          Other (See Comments) and Nausea And Vomiting            Tetracyclines   Other (See Comments) and Nausea And Vomiting       Current Outpatient Medications on File Prior to Visit Medication       Sig       Dispense         Refill            aspirin 81 MG EC tablet         1 tablet                                     clobetasoL (OLUX) 0.05 % topical foam        APPLY TOPICALLY TWICE DAILY AS NEEDED (SCALP FLARES PSORIASIS)                                     clopidogreL (PLAVIX) 75 mg tablet    1 tablet                                     CRESTOR 40 mg tablet         Take 1 tablet by mouth once daily                                 ezetimibe (ZETIA) 10 mg tablet          Take 10 mg by mouth once daily                                irbesartan (AVAPRO) 300 MG tablet 1 tablet                                     metoprolol succinate (TOPROL-XL) 50 MG XL tablet           TAKE 1/2 TABLET BY MOUTH IN THE MORNING AND TAKE 1 TABLET BY MOUTH IN THE EVENING.                            nitroGLYcerin (NITROSTAT) 0.4 MG SL tablet         Place 0.4 mg under the tongue every 5 (five) minutes as needed                               omeprazole (PRILOSEC) 20 MG DR capsule           Take by mouth                                     OZEMPIC 0.25 mg or 0.5 mg(2 mg/1.5 mL) pen injector      Inject subcutaneously                          vardenafiL (LEVITRA) 20 MG tablet   Take by mouth                            No current facility-administered medications on file prior to visit.     Family History Family History Problem           Relation           Age of Onset            Diabetes          Mother              High blood pressure (Hypertension)   Father               Hyperlipidemia (Elevated cholesterol)            Father               Diabetes          Father               Myocardial Infarction (Heart attack)   Father          Social History   Tobacco Use Smoking Status           Never Smokeless Tobacco   Never     Social History Social History     Socioeconomic History            Marital status:  Married Tobacco Use            Smoking status:          Never 

## 2021-07-07 ENCOUNTER — Ambulatory Visit: Payer: Medicare Other | Admitting: Podiatry

## 2021-07-13 ENCOUNTER — Telehealth: Payer: Self-pay | Admitting: Pharmacist

## 2021-07-13 NOTE — Telephone Encounter (Signed)
Received fax from pt's insurance that he stopped Repatha due to muscle aches and wanted to know about restarting Zetia. I last spoke with pt in April and at that time advised him to skip a dose of Repatha then resume to see if sx were related to the medication and to provide [place] with an update. Pt has not called the office since then. He already takes rosuvastatin '40mg'$  daily.   Spoke with pt. He reports he did stop Repatha for a month, rx improved, then resumed when he restarted therapy. He wishes to try once more since he has some Repatha still left at home. He will call with an update in a few weeks. If he doesn't tolerate Repatha again, will send in rx for Zetia.

## 2021-07-19 ENCOUNTER — Encounter: Payer: Self-pay | Admitting: Dermatology

## 2021-07-19 ENCOUNTER — Ambulatory Visit (INDEPENDENT_AMBULATORY_CARE_PROVIDER_SITE_OTHER): Payer: Medicare Other | Admitting: Dermatology

## 2021-07-19 DIAGNOSIS — L820 Inflamed seborrheic keratosis: Secondary | ICD-10-CM

## 2021-07-19 DIAGNOSIS — L570 Actinic keratosis: Secondary | ICD-10-CM

## 2021-07-21 DIAGNOSIS — M542 Cervicalgia: Secondary | ICD-10-CM | POA: Diagnosis not present

## 2021-08-09 DIAGNOSIS — M542 Cervicalgia: Secondary | ICD-10-CM | POA: Diagnosis not present

## 2021-08-10 ENCOUNTER — Encounter: Payer: Self-pay | Admitting: Dermatology

## 2021-08-10 NOTE — Progress Notes (Signed)
   Follow-Up Visit   Subjective  Collin Morse is a 71 y.o. male who presents for the following: Skin Problem (Back of left shoulder x weeks-  has become very sore).  Sore crust on upper back, check face Location:  Duration:  Quality:  Associated Signs/Symptoms: Modifying Factors:  Severity:  Timing: Context:   Objective  Well appearing patient in no apparent distress; mood and affect are within normal limits. Left Temple, Right Buccal Cheek Gritty 4 mm pink crusts  Left Upper Back Inflamed pink flattopped 4 mm papule    All skin waist up examined.   Assessment & Plan    AK (actinic keratosis) (2) Left Temple; Right Buccal Cheek  Destruction of lesion - Left Temple, Right Buccal Cheek Complexity: simple   Destruction method: cryotherapy   Informed consent: discussed and consent obtained   Timeout:  patient name, date of birth, surgical site, and procedure verified Lesion destroyed using liquid nitrogen: Yes   Cryotherapy cycles:  5 Outcome: patient tolerated procedure well with no complications   Post-procedure details: wound care instructions given    Inflamed seborrheic keratosis Left Upper Back  Destruction of lesion - Left Upper Back Complexity: simple   Destruction method: cryotherapy   Informed consent: discussed and consent obtained   Lesion destroyed using liquid nitrogen: Yes   Cryotherapy cycles:  5 Outcome: patient tolerated procedure well with no complications        I, Lavonna Monarch, MD, have reviewed all documentation for this visit.  The documentation on 08/10/21 for the exam, diagnosis, procedures, and orders are all accurate and complete.

## 2021-08-17 DIAGNOSIS — M542 Cervicalgia: Secondary | ICD-10-CM | POA: Diagnosis not present

## 2021-09-08 DIAGNOSIS — M542 Cervicalgia: Secondary | ICD-10-CM | POA: Diagnosis not present

## 2021-09-14 DIAGNOSIS — M542 Cervicalgia: Secondary | ICD-10-CM | POA: Diagnosis not present

## 2021-10-03 ENCOUNTER — Telehealth: Payer: Self-pay | Admitting: Pharmacist

## 2021-10-03 DIAGNOSIS — E782 Mixed hyperlipidemia: Secondary | ICD-10-CM

## 2021-10-03 DIAGNOSIS — I25709 Atherosclerosis of coronary artery bypass graft(s), unspecified, with unspecified angina pectoris: Secondary | ICD-10-CM

## 2021-10-03 MED ORDER — EZETIMIBE 10 MG PO TABS: 10.0000 mg | ORAL_TABLET | Freq: Every day | ORAL | 1 refills | Status: DC

## 2021-10-03 NOTE — Telephone Encounter (Signed)
Patient left voicemail to discuss Repatha.  Called patient back. Had previously discontinued due to muscle pain which he did not know if it was caused by Repatha or Ozempic. Muscle pain has decreased since stopping Repatha but is still apparent at other times. Has a physical therapist for his neck and shoulder as well. Would like to restart Zetia and d/c Repatha at this time. Advised we could do that but to keep his Repatha so when we recheck his lipid panel in a few months he has it on hand if needed

## 2021-10-04 DIAGNOSIS — M542 Cervicalgia: Secondary | ICD-10-CM | POA: Diagnosis not present

## 2021-10-16 DIAGNOSIS — Z23 Encounter for immunization: Secondary | ICD-10-CM | POA: Diagnosis not present

## 2021-10-19 DIAGNOSIS — M542 Cervicalgia: Secondary | ICD-10-CM | POA: Diagnosis not present

## 2021-10-21 ENCOUNTER — Telehealth: Payer: Self-pay

## 2021-10-21 NOTE — Telephone Encounter (Signed)
...     Pre-operative Risk Assessment    Patient Name: Collin Morse  DOB: 07-11-1950 MRN: [id number]      Request for Surgical Clearance    Procedure:   HERNIA SURGERY  Date of Surgery:  Clearance TBD                                 Surgeon:  Ralene Ok Surgeon's Group or Practice Name:  [facility] Phone number:  [phone] Fax number:  [phone]   Type of Clearance Requested:   - Medical  - Pharmacy:  Hold Aspirin and Clopidogrel (Plavix) ADVISE HOW PATIENT SHOULD HOLD    Type of Anesthesia:  General    Additional requests/questions:    Gwenlyn Found   10/21/2021, 10:53 AM

## 2021-10-24 NOTE — Telephone Encounter (Signed)
   Name: Collin Morse  DOB: [birth year]  MRN: [id number]  Primary Cardiologist: Candee Furbish, MD   Preoperative team, please contact this patient and set up a phone call appointment for further preoperative risk assessment. Please obtain consent and complete medication review. Thank you for your help.  I confirm that guidance regarding antiplatelet and oral anticoagulation therapy has been completed and, if necessary, noted below.  Per office protocol, if patient is without any new symptoms or concerns at the time of their virtual visit, he/she may hold Aspirin for 5-7 days prior to procedure and Plavix for 5 days prior to procedure. Please resume Asprin and Plavix as soon as possible postprocedure, at the discretion of the surgeon.    Lenna Sciara, NP 10/24/2021, 4:52 PM [place]

## 2021-10-25 NOTE — Telephone Encounter (Signed)
S/w the pt and he tells me he is not planning surgery until late dec 2023 or jan 2024. Pt said he will keep his appt with Dr. Marlou Porch. Pt also thought he was supposed to have some lab work done before he saw Dr. Marlou Porch. He states when he saw Karren Cobble, Pharm-d 09/2021. I stated I did not see any lab work ordered or scheduled. I advised the pt since his appt is early enough to come in fasting and Dr. Marlou Porch can order the lab work that the pharm-d was mentioning. Pt is agreeable to plan of care and thanked me for the call and the help.

## 2021-11-02 DIAGNOSIS — M542 Cervicalgia: Secondary | ICD-10-CM | POA: Diagnosis not present

## 2021-11-03 DIAGNOSIS — M5412 Radiculopathy, cervical region: Secondary | ICD-10-CM | POA: Diagnosis not present

## 2021-11-04 DIAGNOSIS — Z23 Encounter for immunization: Secondary | ICD-10-CM | POA: Diagnosis not present

## 2021-11-09 ENCOUNTER — Other Ambulatory Visit: Payer: Self-pay | Admitting: Physical Medicine and Rehabilitation

## 2021-11-09 DIAGNOSIS — M542 Cervicalgia: Secondary | ICD-10-CM

## 2021-11-16 DIAGNOSIS — M542 Cervicalgia: Secondary | ICD-10-CM | POA: Diagnosis not present

## 2021-11-20 ENCOUNTER — Ambulatory Visit
Admission: RE | Admit: 2021-11-20 | Discharge: 2021-11-20 | Disposition: A | Discharge status: Home / Self Care | Payer: Medicare Other | Source: Ambulatory Visit | Attending: Physical Medicine and Rehabilitation | Admitting: Physical Medicine and Rehabilitation

## 2021-11-20 DIAGNOSIS — M4802 Spinal stenosis, cervical region: Secondary | ICD-10-CM | POA: Diagnosis not present

## 2021-11-20 DIAGNOSIS — M542 Cervicalgia: Secondary | ICD-10-CM | POA: Diagnosis not present

## 2021-11-24 DIAGNOSIS — M542 Cervicalgia: Secondary | ICD-10-CM | POA: Diagnosis not present

## 2021-11-28 DIAGNOSIS — M5412 Radiculopathy, cervical region: Secondary | ICD-10-CM | POA: Diagnosis not present

## 2021-11-28 DIAGNOSIS — M47812 Spondylosis without myelopathy or radiculopathy, cervical region: Secondary | ICD-10-CM | POA: Diagnosis not present

## 2021-12-02 ENCOUNTER — Other Ambulatory Visit: Payer: Self-pay | Admitting: Cardiology

## 2021-12-07 DIAGNOSIS — M542 Cervicalgia: Secondary | ICD-10-CM | POA: Diagnosis not present

## 2021-12-13 DIAGNOSIS — H43821 Vitreomacular adhesion, right eye: Secondary | ICD-10-CM | POA: Diagnosis not present

## 2021-12-13 DIAGNOSIS — H35372 Puckering of macula, left eye: Secondary | ICD-10-CM | POA: Diagnosis not present

## 2021-12-13 DIAGNOSIS — H40013 Open angle with borderline findings, low risk, bilateral: Secondary | ICD-10-CM | POA: Diagnosis not present

## 2021-12-13 DIAGNOSIS — H43813 Vitreous degeneration, bilateral: Secondary | ICD-10-CM | POA: Diagnosis not present

## 2021-12-13 DIAGNOSIS — Z961 Presence of intraocular lens: Secondary | ICD-10-CM | POA: Diagnosis not present

## 2021-12-14 ENCOUNTER — Ambulatory Visit: Payer: Medicare Other | Attending: Cardiology | Admitting: Cardiology

## 2021-12-14 ENCOUNTER — Encounter: Payer: Self-pay | Admitting: Cardiology

## 2021-12-14 VITALS — BP 130/80 | HR 66 | Ht 72.0 in | Wt 210.0 lb

## 2021-12-14 DIAGNOSIS — E782 Mixed hyperlipidemia: Secondary | ICD-10-CM | POA: Insufficient documentation

## 2021-12-14 DIAGNOSIS — I25709 Atherosclerosis of coronary artery bypass graft(s), unspecified, with unspecified angina pectoris: Secondary | ICD-10-CM | POA: Insufficient documentation

## 2021-12-14 DIAGNOSIS — Z79899 Other long term (current) drug therapy: Secondary | ICD-10-CM | POA: Insufficient documentation

## 2021-12-14 DIAGNOSIS — I10 Essential (primary) hypertension: Secondary | ICD-10-CM | POA: Insufficient documentation

## 2021-12-14 MED ORDER — NITROGLYCERIN 0.4 MG SL SUBL: 0.4000 mg | SUBLINGUAL_TABLET | SUBLINGUAL | 3 refills | Status: DC | PRN

## 2021-12-14 NOTE — Progress Notes (Signed)
Cardiology Office Note:    Date:  12/14/2021   ID:  Collin Morse, DOB 1950/11/29, MRN [id number]  PCP:  Marda Stalker, PA-C   [facility] Providers Cardiologist:  Candee Furbish, MD     Referring MD: Marda Stalker, PA-C    History of Present Illness:    Collin Morse is a 71 y.o. male here for follow-up challenging hypertension, sees the hypertension clinic, CAD post CABG 2011.   Megan Supple increased irbesartan to 300 mg a day.  He also  started PCSK9 inhibitor Repatha for hyperlipidemia.  Looks like he had some aches.  He is now on Crestor and Zetia. plan was to increase spironolactone further if needed and sodium stable on follow-up labs.  His serum sodium went down to 133 from 137.  Creatinine remains at 1.32.  Overall doing fairly well.  Has had some aches with Repatha this was stopped.  They went away.  Neck pain tingling right shoulder.  Physical therapy has helped, [facility] well.  He is going to [place] for Christmas holidays.  He has been there 3 times.  His future son-in-law is a GI doctor, they discussed esophageal spasm.  He does admit that after taking the amlodipine for 2 days his symptoms seem to go away.  He thinks that they were associated with high stress events that usually occur once or twice a year.   He also increased his proton pump inhibitor.   He felt as though the amlodipine was causing more worry at night.  I put it on his medication intolerances.   He does take Cialis occasionally, vacations for instance.  This has implications for isosorbide.  He can get short acting nitroglycerin refilled as long as he does not take both concomitantly.  Past Medical History:  Diagnosis Date   Coronary artery disease 2011   Coronary atherosclerosis of native coronary artery 12/18/2012   CABG LIMA to LAD, SVG to circumflex 01/2009   Dizziness and giddiness    GERD (gastroesophageal reflux disease)    Hernia    HTN (hypertension) since 2008   Hyperlipemia     Hypothyroidism    Prostate cancer [facility])     Past Surgical History:  Procedure Laterality Date   CATARACT EXTRACTION     Bilateral   CORONARY ARTERY BYPASS GRAFT  2011   GAS INSERTION Right 04/24/2016   Procedure: INSERTION OF GAS- SF6;  Surgeon: Jalene Mullet, MD;  Location: [facility];  Service: Ophthalmology;  Laterality: Right;   LASER PHOTO ABLATION Right 04/24/2016   Procedure: LASER PHOTO ABLATION-ENDOLASER;  Surgeon: Jalene Mullet, MD;  Location: [facility];  Service: Ophthalmology;  Laterality: Right;   LEFT HEART CATH AND CORS/GRAFTS ANGIOGRAPHY N/A 10/11/2020   Procedure: LEFT HEART CATH AND CORS/GRAFTS ANGIOGRAPHY;  Surgeon: Troy Sine, MD;  Location: [place] [facility];  Service: Cardiovascular;  Laterality: N/A;   PARS PLANA VITRECTOMY Right 04/24/2016   Procedure: PARS PLANA VITRECTOMY WITH 25 GAUGE;  Surgeon: Jalene Mullet, MD;  Location: [place];  Service: Ophthalmology;  Laterality: Right;   PROSTATE SURGERY  2006    Current Medications: Current Meds  Medication Sig   aspirin EC 81 MG tablet Take 81 mg by mouth in the morning. Swallow whole.   clobetasol (OLUX) 0.05 % topical foam Apply 1 application topically 2 (two) times daily as needed (scalp flares psoriasis).   clopidogrel (PLAVIX) 75 MG tablet TAKE 1 TAB DAILY. PT TAKES MED IN THE PLACE OF ASPRIN WHEN HE GETS HIS  ALLERGY SHOT IN [place].   ezetimibe (ZETIA) 10 MG tablet Take 1 tablet (10 mg total) by mouth daily.   irbesartan (AVAPRO) 300 MG tablet Take 1 tablet (300 mg total) by mouth daily.   metoprolol succinate (TOPROL-XL) 50 MG 24 hr tablet TAKE 1/2 TABLET BY MOUTH IN THE MORNING AND TAKE 1 TABLET BY MOUTH IN THE EVENING.   omeprazole (PRILOSEC) 20 MG capsule Take 20 mg by mouth in the morning.   rosuvastatin (CRESTOR) 40 MG tablet TAKE 1 TABLET BY MOUTH EVERY DAY   Semaglutide,0.25 or 0.'5MG'$ /DOS, (OZEMPIC, 0.25 OR 0.5 MG/DOSE,) 2 MG/1.5ML SOPN Inject 0.5 mg into the skin once a week.   Semaglutide,0.25 or  0.'5MG'$ /DOS, (OZEMPIC, 0.25 OR 0.5 MG/DOSE,) 2 MG/3ML SOPN See admin instructions.   spironolactone (ALDACTONE) 25 MG tablet TAKE 1 TABLET (25 MG TOTAL) BY MOUTH DAILY.   vardenafil (LEVITRA) 20 MG tablet Take 20 mg by mouth daily as needed for erectile dysfunction.   [DISCONTINUED] nitroGLYCERIN (NITROSTAT) 0.4 MG SL tablet PLACE 1 TABLET (0.4 MG TOTAL) UNDER THE TONGUE EVERY 5 (FIVE) MINUTES AS NEEDED FOR CHEST PAIN.     Allergies:   Metformin and related, Amlodipine besylate, Sulfa antibiotics, and Tetracyclines & related   Social History   Socioeconomic History   Marital status: Married    Spouse name: [name]   Number of children: 3   Years of education: college   Highest education level: Not on file  Occupational History   Occupation: [employer] Teacher, music: [employer]  Tobacco Use   Smoking status: Never   Smokeless tobacco: Never  Vaping Use   Vaping Use: Never used  Substance and Sexual Activity   Alcohol use: Yes    Alcohol/week: 2.0 standard drinks of alcohol    Types: 2 Cans of beer per week    Comment: OCC   Drug use: No   Sexual activity: Not on file  Other Topics Concern   Not on file  Social History Narrative   Patient works for [employer], Patient lives at home with his wife [name]). College education. Right handed. 3 children   Caffeine- two glasses daily ice tea.   Social Determinants of Health   Financial Resource Strain: Not on file  Food Insecurity: Not on file  Transportation Needs: Not on file  Physical Activity: Not on file  Stress: Not on file  Social Connections: Not on file     Family History: The patient's family history includes Dementia in his mother; Diabetes type II in his father and mother; Heart Problems in his father; High blood pressure in his mother; Lung cancer in his mother.  ROS:   Please see the history of present illness.     All other systems reviewed and are negative.  EKGs/Labs/Other  Studies Reviewed:    The following studies were reviewed today: Stress test 2016-low risk no ischemia   Cardiac catheterization 2022: Widely patent LIMA graft supplying the mid LAD with proximal 30%, 70% stenoses and 90% stenosis proximal to the LIMA insertion site.   Widely patent SVG supplying the left circumflex obtuse marginal vessel.   Large dominant unbypassed RCA with mild luminal irregularity and 50% mid stenoses.   Normal LV function without focal segmental wall motion abnormality.  LVEDP 9 mmHg.   RECOMMENDATION: Medical therapy.  The patient has been on beta-blocker therapy.  With his recent increased blood pressure, will add amlodipine for anti-ischemic, antispasm, as well as blood pressure  benefit.  Continue aggressive lipid-lowering therapy with target LDL less than 70.  Diagnostic Dominance: Right   Recent Labs: 01/28/2021: ALT 53; BUN 15; Creatinine, Ser 1.25; Potassium 4.5; Sodium 136  Recent Lipid Panel    Component Value Date/Time   CHOL 93 (L) 01/28/2021 0845   TRIG 212 (H) 01/28/2021 0845   HDL 34 (L) 01/28/2021 0845   CHOLHDL 2.7 01/28/2021 0845   CHOLHDL 3 07/16/2014 0756   VLDL 18.4 07/16/2014 0756   LDLCALC 26 01/28/2021 0845     Risk Assessment/Calculations:              Physical Exam:    VS:  BP 130/80 (BP Location: Left Arm, Patient Position: Sitting, Cuff Size: Normal)   Pulse 66   Ht 6' (1.829 m)   Wt 210 lb (95.3 kg)   BMI 28.48 kg/m     Wt Readings from Last 3 Encounters:  12/14/21 210 lb (95.3 kg)  02/15/21 220 lb 12.8 oz (100.2 kg)  01/28/21 220 lb (99.8 kg)     GEN: Well nourished, well developed, in no acute distress HEENT: normal Neck: no JVD, carotid bruits, or masses Cardiac: RRR; no murmurs, rubs, or gallops,no edema, CABG scar Respiratory:  clear to auscultation bilaterally, normal work of breathing GI: soft, nontender, nondistended, + BS MS: no deformity or atrophy Skin: warm and dry, no rash Neuro:  Alert and  Oriented x 3, Strength and sensation are intact Psych: euthymic mood, full affect  EKG 12/14/2021 today shows sinus rhythm PVC left axis deviation inferior infarct pattern nonspecific ST-T wave changes  ASSESSMENT:    1. Coronary artery disease involving coronary bypass graft of native heart with angina pectoris ([place])   2. Mixed hyperlipidemia   3. Essential hypertension   4. Medication management     PLAN:    In order of problems listed above:  Preop cardiovascular risk Upcoming hernia surgery with Dr. Rosendo Gros.  He may proceed with low overall cardiac risk.  He has had stable coronary artery disease post bypass.  He may hold his Plavix for 5 days prior to surgery and resume when felt to be safe from a bleeding perspective.  Remember, he only takes his Plavix around his allergy shots.  He usually takes aspirin daily instead.  He is able to complete greater than 4 METS of activity.   Coronary artery disease involving coronary bypass graft of native heart with angina pectoris [facility]) Catheterization 2022 resulted in medical management with patent LIMA and patent SVG to circumflex from bypass in 2011.  Continuing with goal-directed medical therapy.  No significant anginal symptoms.  Overall doing well.   Essential hypertension Challenging.  Seems to be under good control now in the 130/80 range posterior and at home.  Amlodipine was stopped because of intolerance.  He is on irbesartan now up to 300 mg.  Toprol 50 mg.  Serum sodium went a little bit lower at this check while on spironolactone 25 mg a day.   Appreciate pharmacy lead clinic.   Mixed hyperlipidemia Stopped Repatha, PCSK9 inhibitor, due to muscle aches which was impending his golf game.  He stated that he started the Ozempic as well as the Repatha both at the same time.  He stopped the Repatha and feels better.  We will go ahead and check lipid panel.   He is now on Zetia 10 Crestor 40.  LDL 26 previously, appreciate lipid clinic.     Diabetes mellitus with coincident hypertension (HCC) Prior hemoglobin  A1c 6.5.  Previously unable to tolerate metformin.  On Ozempic now.  Excellent.      We will see him back in 6 months     Medication Adjustments/Labs and Tests Ordered: Current medicines are reviewed at length with the patient today.  Concerns regarding medicines are outlined above.  Orders Placed This Encounter  Procedures   Lipid panel   Comprehensive metabolic panel   CBC with Differential/Platelet   EKG 12-Lead   Meds ordered this encounter  Medications   nitroGLYCERIN (NITROSTAT) 0.4 MG SL tablet    Sig: Place 1 tablet (0.4 mg total) under the tongue every 5 (five) minutes as needed for chest pain.    Dispense:  25 tablet    Refill:  3    Patient Instructions  Medication Instructions:  Your physician recommends that you continue on your current medications as directed. Please refer to the Current Medication list given to you today.  *If you need a refill on your cardiac medications before your next appointment, please call your pharmacy*  Lab Work: Your physician recommends that you have lab work today- CMET, CBC, and Lipid panel  If you have labs (blood work) drawn today and your tests are completely normal, you will receive your results only by: MyChart Message (if you have MyChart) OR A paper copy in the mail If you have any lab test that is abnormal or we need to change your treatment, we will call you to review the results.  Testing/Procedures: None ordered today.  Follow-Up: At [facility], you and your health needs are our priority.  As part of our continuing mission to provide you with exceptional heart care, we have created designated Provider Care Teams.  These Care Teams include your primary Cardiologist (physician) and Advanced Practice Providers (APPs -  Physician Assistants and Nurse Practitioners) who all work together to provide you with the care you need, when you  need it.  We recommend signing up for the patient portal called "MyChart".  Sign up information is provided on this After Visit Summary.  MyChart is used to connect with patients for Virtual Visits (Telemedicine).  Patients are able to view lab/test results, encounter notes, upcoming appointments, etc.  Non-urgent messages can be sent to your provider as well.   To learn more about what you can do with MyChart, go to [email or web address].    Your next appointment:   1 year(s)  The format for your next appointment:   In Person  Provider:   Candee Furbish, MD       Important Information About Sugar         Signed, Candee Furbish, MD  12/14/2021 9:20 AM    [place]

## 2021-12-14 NOTE — Patient Instructions (Addendum)
Medication Instructions:  Your physician recommends that you continue on your current medications as directed. Please refer to the Current Medication list given to you today.  *If you need a refill on your cardiac medications before your next appointment, please call your pharmacy*  Lab Work: Your physician recommends that you have lab work today- CMET, CBC, and Lipid panel  If you have labs (blood work) drawn today and your tests are completely normal, you will receive your results only by: MyChart Message (if you have MyChart) OR A paper copy in the mail If you have any lab test that is abnormal or we need to change your treatment, we will call you to review the results.  Testing/Procedures: None ordered today.  Follow-Up: At [facility], you and your health needs are our priority.  As part of our continuing mission to provide you with exceptional heart care, we have created designated Provider Care Teams.  These Care Teams include your primary Cardiologist (physician) and Advanced Practice Providers (APPs -  Physician Assistants and Nurse Practitioners) who all work together to provide you with the care you need, when you need it.  We recommend signing up for the patient portal called "MyChart".  Sign up information is provided on this After Visit Summary.  MyChart is used to connect with patients for Virtual Visits (Telemedicine).  Patients are able to view lab/test results, encounter notes, upcoming appointments, etc.  Non-urgent messages can be sent to your provider as well.   To learn more about what you can do with MyChart, go to [email or web address].    Your next appointment:   1 year(s)  The format for your next appointment:   In Person  Provider:   Candee Furbish, MD       Important Information About Sugar

## 2021-12-15 LAB — COMPREHENSIVE METABOLIC PANEL
ALT: 58 IU/L — ABNORMAL HIGH (ref 0–44)
AST: 32 IU/L (ref 0–40)
Albumin/Globulin Ratio: 2 (ref 1.2–2.2)
Albumin: 5.1 g/dL — ABNORMAL HIGH (ref 3.8–4.8)
Alkaline Phosphatase: 43 IU/L — ABNORMAL LOW (ref 44–121)
BUN/Creatinine Ratio: 13 (ref 10–24)
BUN: 17 mg/dL (ref 8–27)
Bilirubin Total: 0.6 mg/dL (ref 0.0–1.2)
CO2: 23 mmol/L (ref 20–29)
Calcium: 9.5 mg/dL (ref 8.6–10.2)
Chloride: 101 mmol/L (ref 96–106)
Creatinine, Ser: 1.29 mg/dL — ABNORMAL HIGH (ref 0.76–1.27)
Globulin, Total: 2.5 g/dL (ref 1.5–4.5)
Glucose: 111 mg/dL — ABNORMAL HIGH (ref 70–99)
Potassium: 4.8 mmol/L (ref 3.5–5.2)
Sodium: 140 mmol/L (ref 134–144)
Total Protein: 7.6 g/dL (ref 6.0–8.5)
eGFR: 59 mL/min/{1.73_m2} — ABNORMAL LOW (ref >59)

## 2021-12-15 LAB — CBC WITH DIFFERENTIAL/PLATELET
Basophils Absolute: 0.1 10*3/uL (ref 0.0–0.2)
Basos: 1 %
EOS (ABSOLUTE): 0.2 10*3/uL (ref 0.0–0.4)
Eos: 2 %
Hematocrit: 44.3 % (ref 37.5–51.0)
Hemoglobin: 15.1 g/dL (ref 13.0–17.7)
Immature Grans (Abs): 0 10*3/uL (ref 0.0–0.1)
Immature Granulocytes: 0 %
Lymphocytes Absolute: 2.5 10*3/uL (ref 0.7–3.1)
Lymphs: 31 %
MCH: 32.6 pg (ref 26.6–33.0)
MCHC: 34.1 g/dL (ref 31.5–35.7)
MCV: 96 fL (ref 79–97)
Monocytes Absolute: 0.6 10*3/uL (ref 0.1–0.9)
Monocytes: 7 %
Neutrophils Absolute: 4.6 10*3/uL (ref 1.4–7.0)
Neutrophils: 59 %
Platelets: 237 10*3/uL (ref 150–450)
RBC: 4.63 x10E6/uL (ref 4.14–5.80)
RDW: 12.6 % (ref 11.6–15.4)
WBC: 7.9 10*3/uL (ref 3.4–10.8)

## 2021-12-15 LAB — LIPID PANEL
Chol/HDL Ratio: 3.5 ratio (ref 0.0–5.0)
Cholesterol, Total: 149 mg/dL (ref 100–199)
HDL: 43 mg/dL (ref >39)
LDL Chol Calc (NIH): 70 mg/dL (ref 0–99)
Triglycerides: 222 mg/dL — ABNORMAL HIGH (ref 0–149)
VLDL Cholesterol Cal: 36 mg/dL (ref 5–40)

## 2021-12-19 ENCOUNTER — Other Ambulatory Visit: Payer: Self-pay

## 2021-12-29 DIAGNOSIS — M542 Cervicalgia: Secondary | ICD-10-CM | POA: Diagnosis not present

## 2022-01-31 ENCOUNTER — Encounter (HOSPITAL_COMMUNITY): Payer: Self-pay | Admitting: General Surgery

## 2022-01-31 NOTE — Pre-Procedure Instructions (Signed)
PCP - Pura Spice PA Cardiologist - Dr. Marlou Porch  EKG - 12/14/21 Chest x-ray - n/a ECHO - n/a Cardiac Cath - 10/11/20 CPAP - n/a  Pre diabetic- does not check sugars  Blood Thinner Instructions: Pt only takes Plavix around time of allergy shot, normally on ASA only   Aspirin Instructions: no instructions, recommended pt call his cardiologist for instructions, pt states he will stop today as he has before for other procedures.   ERAS Protcol - yes until 1100 COVID TEST- n/a  Anesthesia review: Yes - CAD  Cardiac clearance received 12/14/21 -------------  SDW INSTRUCTIONS:  Your procedure is scheduled on 02/02/22. Please report to [facility] Main Entrance "A" at 1130 A.M., and check in at the Admitting office. Call this number if you have problems the morning of surgery: [phone]   Remember: Do not eat  after midnight the night before your surgery  You may drink clear liquids until 1100 the morning of your surgery.   Clear liquids allowed are: Water, Non-Citrus Juices (without pulp), Carbonated Beverages, Clear Tea, Black Coffee Only, and Gatorade   Medications to take morning of surgery with a sip of water include: zetia, gabapentin, metoprolol, protoniz, crestor   Last dose of Ozempic was 01/25/22, pt instructed not to take dose tomorrow 02/01/22.   As of today, STOP taking any Aspirin (unless otherwise instructed by your surgeon), Aleve, Naproxen, Ibuprofen, Motrin, Advil, Goody's, BC's, all herbal medications, fish oil, and all vitamins.   The Morning of Surgery Do not wear jewelry, make-up or nail polish. Do not wear lotions, powders, or perfumes/colognes, or deodorant Do not bring valuables to the hospital. [facility] is not responsible for any belongings or valuables.  If you are a smoker, DO NOT Smoke 24 hours prior to surgery  If you wear a CPAP at night please bring your mask the morning of surgery   Remember that you must have someone to transport you home  after your surgery, and remain with you for 24 hours if you are discharged the same day.  Please bring cases for contacts, glasses, hearing aids, dentures or bridgework because it cannot be worn into surgery.   Patients discharged the day of surgery will not be allowed to drive home.   Please shower the NIGHT BEFORE/MORNING OF SURGERY (use antibacterial soap like DIAL soap if possible). Wear comfortable clothes the morning of surgery. Oral Hygiene is also important to reduce your risk of infection.  Remember - BRUSH YOUR TEETH THE MORNING OF SURGERY WITH YOUR REGULAR TOOTHPASTE  Patient denies shortness of breath, fever, cough and chest pain.

## 2022-02-01 NOTE — Anesthesia Preprocedure Evaluation (Addendum)
Anesthesia Evaluation  Patient identified by MRN, date of birth, ID band Patient awake    Reviewed: Allergy & Precautions, NPO status , Patient's Chart, lab work & pertinent test results, reviewed documented beta blocker date and time   Airway Mallampati: I  TM Distance: >3 FB Neck ROM: Full    Dental no notable dental hx. (+) Caps, Teeth Intact, Dental Advisory Given   Pulmonary neg pulmonary ROS   Pulmonary exam normal breath sounds clear to auscultation       Cardiovascular hypertension, Pt. on medications and Pt. on home beta blockers + angina with exertion + CAD, + Past MI and + CABG  Normal cardiovascular exam Rhythm:Regular Rate:Normal  CABG x 2 2011  EKG 12/15/21 NSR, LAD, Inferior infarct  Cardiac cath 10/11/20 Mid LAD-2 lesion is 90% stenosed.   Mid LAD-1 lesion is 70% stenosed.   Prox LAD lesion is 30% stenosed.   Mid RCA lesion is 50% stenosed.   2nd Mrg lesion is 60% stenosed.   LV end diastolic pressure is normal.   The left ventricular ejection fraction is 55-65% by visual estimate.   There is no mitral valve regurgitation.   Widely patent LIMA graft supplying the mid LAD with proximal 30%, 70% stenoses and 90% stenosis proximal to the LIMA insertion site.   Widely patent SVG supplying the left circumflex obtuse marginal vessel.   Large dominant unbypassed RCA with mild luminal irregularity and 50% mid stenoses.   Normal LV function without focal segmental wall motion abnormality.  LVEDP 9 mmHg.    Neuro/Psych negative neurological ROS  negative psych ROS   GI/Hepatic Neg liver ROS,GERD  Medicated and Controlled,,  Endo/Other  diabetes, Well Controlled, Type 2, Oral Hypoglycemic AgentsHypothyroidism  GLP-1 agonist therapy- last dose 1/17 Hyperlipidemia  Renal/GU negative Renal ROS   Hx/o prostate Ca    Musculoskeletal Ventral hernia   Abdominal   Peds  Hematology negative hematology  ROS (+) Plavix therapy   Anesthesia Other Findings   Reproductive/Obstetrics                             Anesthesia Physical Anesthesia Plan  ASA: 3  Anesthesia Plan: General   Post-op Pain Management: Minimal or no pain anticipated, Dilaudid IV and Precedex   Induction:   PONV Risk Score and Plan: 4 or greater and Treatment may vary due to age or medical condition, Ondansetron and Dexamethasone  Airway Management Planned: Oral ETT  Additional Equipment: None  Intra-op Plan:   Post-operative Plan: Extubation in OR  Informed Consent: I have reviewed the patients History and Physical, chart, labs and discussed the procedure including the risks, benefits and alternatives for the proposed anesthesia with the patient or authorized representative who has indicated his/her understanding and acceptance.     Dental advisory given  Plan Discussed with: CRNA and Anesthesiologist  Anesthesia Plan Comments: (PAT note written 02/01/2022 by Shonna Chock, PA-C.  )       Anesthesia Quick Evaluation

## 2022-02-01 NOTE — H&P (Signed)
Chief Complaint: Hernia       History of Present Illness: Collin Morse is a 72 y.o. male who is seen today as an office consultation at the request of Dr. Rolland Porter for evaluation of Hernia .   Patient is a 72 year old male who comes in secondary to an incisional midline hernia.  This is just above the umbilicus.   Patient had a previous robotic prostatectomy in the past.  Patient also had a previous CABG in 2011.  Patient sees Dr. Marlou Porch is cardiologist.   Patient states that he has had the hernia over the last 4 years.  He states it is gotten larger.  He states he is currently on Ozempic and has lost weight and become more noticeable.   He had no significant pain to the area no signs or symptoms of incarceration or strangulation.   Patient is otherwise active outside with gardening and golfing.       Review of Systems: A complete review of systems was obtained from the patient.  I have reviewed this information and discussed as appropriate with the patient.  See HPI as well for other ROS.   ROS      Medical History: Past Medical History Past Medical History: Diagnosis        Date            GERD (gastroesophageal reflux disease)                   History of cancer                 There is no problem list on file for this patient.     Past Surgical History History reviewed. No pertinent surgical history.     Allergies Allergies Allergen           Reactions            Sulfa (Sulfonamide Antibiotics)          Other (See Comments) and Nausea And Vomiting            Tetracyclines   Other (See Comments) and Nausea And Vomiting       Current Outpatient Medications on File Prior to Visit Medication       Sig       Dispense         Refill            aspirin 81 MG EC tablet         1 tablet                                     clobetasoL (OLUX) 0.05 % topical foam        APPLY TOPICALLY TWICE DAILY AS NEEDED (SCALP FLARES PSORIASIS)                                     clopidogreL (PLAVIX) 75 mg tablet    1 tablet                                     CRESTOR 40 mg tablet         Take 1 tablet by mouth once daily  ezetimibe (ZETIA) 10 mg tablet          Take 10 mg by mouth once daily                                irbesartan (AVAPRO) 300 MG tablet 1 tablet                                     metoprolol succinate (TOPROL-XL) 50 MG XL tablet           TAKE 1/2 TABLET BY MOUTH IN THE MORNING AND TAKE 1 TABLET BY MOUTH IN THE EVENING.                            nitroGLYcerin (NITROSTAT) 0.4 MG SL tablet         Place 0.4 mg under the tongue every 5 (five) minutes as needed                               omeprazole (PRILOSEC) 20 MG DR capsule           Take by mouth                                     OZEMPIC 0.25 mg or 0.5 mg(2 mg/1.5 mL) pen injector      Inject subcutaneously                          vardenafiL (LEVITRA) 20 MG tablet   Take by mouth                            No current facility-administered medications on file prior to visit.     Family History Family History Problem           Relation           Age of Onset            Diabetes          Mother              High blood pressure (Hypertension)   Father               Hyperlipidemia (Elevated cholesterol)            Father               Diabetes          Father               Myocardial Infarction (Heart attack)   Father          Social History   Tobacco Use Smoking Status           Never Smokeless Tobacco   Never     Social History Social History     Socioeconomic History            Marital status:  Married Tobacco Use            Smoking status:          Never  Smokeless tobacco:    Never       Objective:     Vitals:             07/06/21 0832 BP:      (!) 142/82 Pulse:  76 Weight:            93.4 kg (206 lb) Height: 182.9 cm (6')   Body mass index is 27.94 kg/m.   Physical Exam    Hernia Size:6cm Incarcerated: yes Initial  Hernia   Assessment and Plan: Diagnoses and all orders for this visit:   Incisional hernia without obstruction or gangrene     Collin Morse is a 72 y.o. male    We will obtain cardiac clearance prior to scheduling.  Patient would like to schedule this in December most likely.   1.          We will proceed to the OR for a lap ventral hernia repair with mesh. 2.         All risks and benefits were discussed with the patient, to generally include infection, bleeding, damage to surrounding structures, acute and chronic nerve pain, and recurrence. Alternatives were offered and described.  All questions were answered and the patient voiced understanding of the procedure and wishes to proceed at this point.             No follow-ups on file.   Ralene Ok, MD, [facility], [facility]

## 2022-02-01 NOTE — Progress Notes (Signed)
Anesthesia Chart Review: Collin Morse   Case: [id number] Date/Time: 02/02/22 1345   Procedure: LAPAROSCOPIC VENTRAL HERNIA WITH MESH   Anesthesia type: General   Pre-op diagnosis: INCISIONAL HERNIA   Location: [facility] OR ROOM 09 / [facility]   Surgeons: Ralene Ok, MD       DISCUSSION: Patient is a 72 year old male scheduled for the above procedure.  History includes never smoker, HTN, CAD (s/p CABG: LIMA-LAD, SVG-CX/OM 01/22/09), hypothyroidism, prostate cancer (s/p surgery 2006), GERD.   Last cardiology visit with Dr. Marlou Porch was on 12/14/21. He wrote, "Preop cardiovascular risk Upcoming hernia surgery with Dr. Rosendo Gros.  He may proceed with low overall cardiac risk.  He has had stable coronary artery disease post bypass.  He may hold his Plavix for 5 days prior to surgery and resume when felt to be safe from a bleeding perspective.  Remember, he only takes his Plavix around his allergy shots.  He usually takes aspirin daily instead.  He is able to complete greater than 4 METS of activity."   He is a same-day workup, so labs as indicated on arrival.  Anesthesia team to evaluate on the day of surgery. Last Ozempic documented as 01/25/22 and given PAT instructions not to take on 02/01/22.    VS: Ht '6\' 1"'$  (1.854 m)   Wt 93 kg   BMI 27.05 kg/m  BP Readings from Last 3 Encounters:  12/14/21 130/80  02/15/21 132/76  01/28/21 120/70   Pulse Readings from Last 3 Encounters:  12/14/21 66  02/15/21 64  01/28/21 60    PROVIDERS: Marda Stalker, PA-C is PCP  Candee Furbish, MD is cardiologist   LABS: Most recent labs in [facility] include: Lab Results  Component Value Date   WBC 7.9 12/14/2021   HGB 15.1 12/14/2021   HCT 44.3 12/14/2021   PLT 237 12/14/2021   GLUCOSE 111 (H) 12/14/2021   ALT 58 (H) 12/14/2021   AST 32 12/14/2021   NA 140 12/14/2021   K 4.8 12/14/2021   CL 101 12/14/2021   CREATININE 1.29 (H) 12/14/2021   BUN 17 12/14/2021   CO2 23 12/14/2021     IMAGES: MRI  C-spine 11/20/21: IMPRESSION: 1. Degenerative spondylosis from C3-4 through C6-7. No compressive central canal stenosis. Foraminal narrowing that could possibly be symptomatic especially on the right at C3-4, C4-5 and C5-6. 2. Edematous facet arthropathy on the right at C5-6 which could be a cause of neck pain.   EKG: 12/14/2021: Sinus rhythm with occasional PVCs.  Left axis deviation.  Inferior infarct, age undetermined.   CV: Cardiac cath 10/11/2020:   Mid LAD-2 lesion is 90% stenosed.   Mid LAD-1 lesion is 70% stenosed.   Prox LAD lesion is 30% stenosed.   Mid RCA lesion is 50% stenosed.   2nd Mrg lesion is 60% stenosed.   LV end diastolic pressure is normal.   The left ventricular ejection fraction is 55-65% by visual estimate.   There is no mitral valve regurgitation.   Widely patent LIMA graft supplying the mid LAD with proximal 30%, 70% stenoses and 90% stenosis proximal to the LIMA insertion site.   Widely patent SVG supplying the left circumflex obtuse marginal vessel.   Large dominant unbypassed RCA with mild luminal irregularity and 50% mid stenoses.   Normal LV function without focal segmental wall motion abnormality.  LVEDP 9 mmHg.   RECOMMENDATION: Medical therapy.  The patient has been on beta-blocker therapy.  With his recent increased blood pressure, will add amlodipine  for anti-ischemic, antispasm, as well as blood pressure benefit.  Continue aggressive lipid-lowering therapy with target LDL less than 70.   US Carotid 05/23/12: IMPRESSION:  No evidence of significant carotid stenosis.  A mild amount of  mixed plaque is noted bilaterally.  Estimated bilateral ICA  stenoses are less than 50%.    Past Medical History:  Diagnosis Date   Coronary artery disease 2011   Coronary atherosclerosis of native coronary artery 12/18/2012   CABG LIMA to LAD, SVG to circumflex 01/2009   Dizziness and giddiness    GERD (gastroesophageal reflux disease)    Hernia    HTN  (hypertension) since 2008   Hyperlipemia    Hypothyroidism    Prostate cancer [facility])     Past Surgical History:  Procedure Laterality Date   CATARACT EXTRACTION     Bilateral   CORONARY ARTERY BYPASS GRAFT  2011   GAS INSERTION Right 04/24/2016   Procedure: INSERTION OF GAS- SF6;  Surgeon: Jalene Mullet, MD;  Location: [facility];  Service: Ophthalmology;  Laterality: Right;   LASER PHOTO ABLATION Right 04/24/2016   Procedure: LASER PHOTO ABLATION-ENDOLASER;  Surgeon: Jalene Mullet, MD;  Location: [place];  Service: Ophthalmology;  Laterality: Right;   LEFT HEART CATH AND CORS/GRAFTS ANGIOGRAPHY N/A 10/11/2020   Procedure: LEFT HEART CATH AND CORS/GRAFTS ANGIOGRAPHY;  Surgeon: Troy Sine, MD;  Location: [facility] CV LAB;  Service: Cardiovascular;  Laterality: N/A;   PARS PLANA VITRECTOMY Right 04/24/2016   Procedure: PARS PLANA VITRECTOMY WITH 25 GAUGE;  Surgeon: Jalene Mullet, MD;  Location: [facility];  Service: Ophthalmology;  Laterality: Right;   PROSTATE SURGERY  2006    MEDICATIONS: No current facility-administered medications for this encounter.    aspirin EC 81 MG tablet   clopidogrel (PLAVIX) 75 MG tablet   ezetimibe (ZETIA) 10 MG tablet   gabapentin (NEURONTIN) 300 MG capsule   irbesartan (AVAPRO) 300 MG tablet   loratadine (CLARITIN) 10 MG tablet   metoprolol succinate (TOPROL-XL) 50 MG 24 hr tablet   nitroGLYCERIN (NITROSTAT) 0.4 MG SL tablet   pantoprazole (PROTONIX) 40 MG tablet   Probiotic TBEC   rosuvastatin (CRESTOR) 40 MG tablet   Semaglutide,0.25 or 0.'5MG'$ /DOS, (OZEMPIC, 0.25 OR 0.5 MG/DOSE,) 2 MG/1.5ML SOPN   spironolactone (ALDACTONE) 25 MG tablet   tadalafil (CIALIS) 5 MG tablet    Myra Gianotti, PA-C Surgical Short Stay/Anesthesiology [facility] Phone [phone] [facility] Phone [phone] 02/01/2022 11:30 AM

## 2022-02-02 ENCOUNTER — Encounter (HOSPITAL_COMMUNITY): Payer: Self-pay | Admitting: General Surgery

## 2022-02-02 ENCOUNTER — Encounter (HOSPITAL_COMMUNITY)
Admission: RE | Disposition: A | Discharge status: Home / Self Care | Payer: Self-pay | Source: Home / Self Care | Attending: General Surgery

## 2022-02-02 ENCOUNTER — Other Ambulatory Visit: Payer: Self-pay

## 2022-02-02 ENCOUNTER — Ambulatory Visit (HOSPITAL_COMMUNITY)
Admission: RE | Admit: 2022-02-02 | Discharge: 2022-02-02 | Disposition: A | Discharge status: Home / Self Care | Payer: Medicare PPO | Attending: General Surgery | Admitting: General Surgery

## 2022-02-02 ENCOUNTER — Ambulatory Visit (HOSPITAL_BASED_OUTPATIENT_CLINIC_OR_DEPARTMENT_OTHER): Payer: Medicare PPO | Admitting: Vascular Surgery

## 2022-02-02 ENCOUNTER — Ambulatory Visit (HOSPITAL_COMMUNITY): Payer: Medicare PPO | Admitting: Vascular Surgery

## 2022-02-02 DIAGNOSIS — I252 Old myocardial infarction: Secondary | ICD-10-CM | POA: Insufficient documentation

## 2022-02-02 DIAGNOSIS — I10 Essential (primary) hypertension: Secondary | ICD-10-CM | POA: Insufficient documentation

## 2022-02-02 DIAGNOSIS — I25119 Atherosclerotic heart disease of native coronary artery with unspecified angina pectoris: Secondary | ICD-10-CM | POA: Diagnosis not present

## 2022-02-02 DIAGNOSIS — Z951 Presence of aortocoronary bypass graft: Secondary | ICD-10-CM | POA: Insufficient documentation

## 2022-02-02 DIAGNOSIS — Z7984 Long term (current) use of oral hypoglycemic drugs: Secondary | ICD-10-CM | POA: Diagnosis not present

## 2022-02-02 DIAGNOSIS — K219 Gastro-esophageal reflux disease without esophagitis: Secondary | ICD-10-CM | POA: Insufficient documentation

## 2022-02-02 DIAGNOSIS — I2510 Atherosclerotic heart disease of native coronary artery without angina pectoris: Secondary | ICD-10-CM | POA: Diagnosis not present

## 2022-02-02 DIAGNOSIS — K432 Incisional hernia without obstruction or gangrene: Secondary | ICD-10-CM

## 2022-02-02 DIAGNOSIS — E039 Hypothyroidism, unspecified: Secondary | ICD-10-CM | POA: Insufficient documentation

## 2022-02-02 DIAGNOSIS — Z7902 Long term (current) use of antithrombotics/antiplatelets: Secondary | ICD-10-CM | POA: Insufficient documentation

## 2022-02-02 DIAGNOSIS — E119 Type 2 diabetes mellitus without complications: Secondary | ICD-10-CM | POA: Diagnosis not present

## 2022-02-02 DIAGNOSIS — E785 Hyperlipidemia, unspecified: Secondary | ICD-10-CM | POA: Insufficient documentation

## 2022-02-02 HISTORY — PX: VENTRAL HERNIA REPAIR: SHX424

## 2022-02-02 HISTORY — PX: INSERTION OF MESH: SHX5868

## 2022-02-02 LAB — CBC
HCT: 47.6 % (ref 39.0–52.0)
Hemoglobin: 15.3 g/dL (ref 13.0–17.0)
MCH: 33.3 pg (ref 26.0–34.0)
MCHC: 32.1 g/dL (ref 30.0–36.0)
MCV: 103.5 fL — ABNORMAL HIGH (ref 80.0–100.0)
Platelets: 214 10*3/uL (ref 150–400)
RBC: 4.6 MIL/uL (ref 4.22–5.81)
RDW: 12.7 % (ref 11.5–15.5)
WBC: 10.1 10*3/uL (ref 4.0–10.5)
nRBC: 0 % (ref 0.0–0.2)

## 2022-02-02 LAB — BASIC METABOLIC PANEL
Anion gap: 10 (ref 5–15)
BUN: 18 mg/dL (ref 8–23)
CO2: 23 mmol/L (ref 22–32)
Calcium: 9 mg/dL (ref 8.9–10.3)
Chloride: 100 mmol/L (ref 98–111)
Creatinine, Ser: 1.22 mg/dL (ref 0.61–1.24)
GFR, Estimated: >60 mL/min (ref >60)
Glucose, Bld: 120 mg/dL — ABNORMAL HIGH (ref 70–99)
Potassium: 4 mmol/L (ref 3.5–5.1)
Sodium: 133 mmol/L — ABNORMAL LOW (ref 135–145)

## 2022-02-02 SURGERY — REPAIR, HERNIA, VENTRAL, LAPAROSCOPIC
Anesthesia: General | Site: Abdomen

## 2022-02-02 MED ORDER — ACETAMINOPHEN 10 MG/ML IV SOLN
INTRAVENOUS | Status: AC
  Filled 2022-02-02: qty 100

## 2022-02-02 MED ORDER — OXYCODONE HCL 5 MG PO TABS
ORAL_TABLET | ORAL | Status: AC
  Filled 2022-02-02: qty 1

## 2022-02-02 MED ORDER — TRAMADOL HCL 50 MG PO TABS: 50.0000 mg | ORAL_TABLET | Freq: Four times a day (QID) | ORAL | 0 refills | Status: AC | PRN

## 2022-02-02 MED ORDER — PROPOFOL 10 MG/ML IV BOLUS
INTRAVENOUS | Status: DC | PRN
  Administered 2022-02-02: 150 mg via INTRAVENOUS

## 2022-02-02 MED ORDER — PROPOFOL 10 MG/ML IV BOLUS
INTRAVENOUS | Status: AC
  Filled 2022-02-02: qty 20

## 2022-02-02 MED ORDER — ROCURONIUM BROMIDE 10 MG/ML (PF) SYRINGE
PREFILLED_SYRINGE | INTRAVENOUS | Status: DC | PRN
  Administered 2022-02-02: 60 mg via INTRAVENOUS

## 2022-02-02 MED ORDER — ORAL CARE MOUTH RINSE: 15.0000 mL | Freq: Once | OROMUCOSAL | Status: AC

## 2022-02-02 MED ORDER — CHLORHEXIDINE GLUCONATE 0.12 % MT SOLN
15.0000 mL | Freq: Once | OROMUCOSAL | Status: AC
  Administered 2022-02-02: 15 mL via OROMUCOSAL
  Filled 2022-02-02: qty 15

## 2022-02-02 MED ORDER — 0.9 % SODIUM CHLORIDE (POUR BTL) OPTIME
TOPICAL | Status: DC | PRN
  Administered 2022-02-02: 1000 mL

## 2022-02-02 MED ORDER — SUGAMMADEX SODIUM 200 MG/2ML IV SOLN
INTRAVENOUS | Status: DC | PRN
  Administered 2022-02-02: 200 mg via INTRAVENOUS

## 2022-02-02 MED ORDER — LIDOCAINE 2% (20 MG/ML) 5 ML SYRINGE
INTRAMUSCULAR | Status: DC | PRN
  Administered 2022-02-02: 30 mg via INTRAVENOUS

## 2022-02-02 MED ORDER — HYDROMORPHONE HCL 1 MG/ML IJ SOLN
0.2500 mg | INTRAMUSCULAR | Status: DC | PRN
  Administered 2022-02-02: 0.25 mg via INTRAVENOUS
  Administered 2022-02-02: 0.5 mg via INTRAVENOUS
  Administered 2022-02-02: 0.25 mg via INTRAVENOUS
  Administered 2022-02-02 (×2): 0.5 mg via INTRAVENOUS

## 2022-02-02 MED ORDER — CEFAZOLIN SODIUM-DEXTROSE 2-3 GM-%(50ML) IV SOLR
INTRAVENOUS | Status: DC | PRN
  Administered 2022-02-02: 2 g via INTRAVENOUS

## 2022-02-02 MED ORDER — PHENYLEPHRINE 80 MCG/ML (10ML) SYRINGE FOR IV PUSH (FOR BLOOD PRESSURE SUPPORT)
PREFILLED_SYRINGE | INTRAVENOUS | Status: DC | PRN
  Administered 2022-02-02 (×5): 80 ug via INTRAVENOUS

## 2022-02-02 MED ORDER — DEXAMETHASONE SODIUM PHOSPHATE 10 MG/ML IJ SOLN
INTRAMUSCULAR | Status: AC
  Filled 2022-02-02: qty 1

## 2022-02-02 MED ORDER — ONDANSETRON HCL 4 MG/2ML IJ SOLN
INTRAMUSCULAR | Status: AC
  Filled 2022-02-02: qty 2

## 2022-02-02 MED ORDER — CEFAZOLIN SODIUM 1 G IJ SOLR
INTRAMUSCULAR | Status: AC
  Filled 2022-02-02: qty 20

## 2022-02-02 MED ORDER — BUPIVACAINE HCL 0.25 % IJ SOLN
INTRAMUSCULAR | Status: DC | PRN
  Administered 2022-02-02: 16 mL

## 2022-02-02 MED ORDER — DEXAMETHASONE SODIUM PHOSPHATE 10 MG/ML IJ SOLN
INTRAMUSCULAR | Status: DC | PRN
  Administered 2022-02-02: 5 mg via INTRAVENOUS

## 2022-02-02 MED ORDER — HYDROMORPHONE HCL 1 MG/ML IJ SOLN
INTRAMUSCULAR | Status: AC
  Filled 2022-02-02: qty 2

## 2022-02-02 MED ORDER — FENTANYL CITRATE (PF) 250 MCG/5ML IJ SOLN
INTRAMUSCULAR | Status: DC | PRN
  Administered 2022-02-02: 150 ug via INTRAVENOUS

## 2022-02-02 MED ORDER — OXYCODONE HCL 5 MG PO TABS
5.0000 mg | ORAL_TABLET | Freq: Once | ORAL | Status: AC | PRN
  Administered 2022-02-02: 5 mg via ORAL

## 2022-02-02 MED ORDER — LIDOCAINE 2% (20 MG/ML) 5 ML SYRINGE
INTRAMUSCULAR | Status: AC
  Filled 2022-02-02: qty 5

## 2022-02-02 MED ORDER — LACTATED RINGERS IV SOLN: INTRAVENOUS | Status: DC

## 2022-02-02 MED ORDER — ACETAMINOPHEN 10 MG/ML IV SOLN
1000.0000 mg | Freq: Once | INTRAVENOUS | Status: DC | PRN
  Administered 2022-02-02: 1000 mg via INTRAVENOUS

## 2022-02-02 MED ORDER — OXYCODONE HCL 5 MG/5ML PO SOLN: 5.0000 mg | Freq: Once | ORAL | Status: AC | PRN

## 2022-02-02 MED ORDER — ROCURONIUM BROMIDE 10 MG/ML (PF) SYRINGE
PREFILLED_SYRINGE | INTRAVENOUS | Status: AC
  Filled 2022-02-02: qty 10

## 2022-02-02 MED ORDER — FENTANYL CITRATE (PF) 250 MCG/5ML IJ SOLN
INTRAMUSCULAR | Status: AC
  Filled 2022-02-02: qty 5

## 2022-02-02 MED ORDER — ONDANSETRON HCL 4 MG/2ML IJ SOLN: 4.0000 mg | Freq: Once | INTRAMUSCULAR | Status: DC | PRN

## 2022-02-02 MED ORDER — BUPIVACAINE HCL (PF) 0.25 % IJ SOLN
INTRAMUSCULAR | Status: AC
  Filled 2022-02-02: qty 30

## 2022-02-02 MED ORDER — ONDANSETRON HCL 4 MG/2ML IJ SOLN
INTRAMUSCULAR | Status: DC | PRN
  Administered 2022-02-02: 4 mg via INTRAVENOUS

## 2022-02-02 SURGICAL SUPPLY — 39 items
APPLIER CLIP 5 13 M/L LIGAMAX5 (MISCELLANEOUS)
BAG COUNTER SPONGE SURGICOUNT (BAG) ×2 IMPLANT
CHLORAPREP W/TINT 26 (MISCELLANEOUS) ×2 IMPLANT
CLIP APPLIE 5 13 M/L LIGAMAX5 (MISCELLANEOUS) IMPLANT
COVER SURGICAL LIGHT HANDLE (MISCELLANEOUS) ×2 IMPLANT
DERMABOND ADVANCED .7 DNX12 (GAUZE/BANDAGES/DRESSINGS) ×2 IMPLANT
DEVICE SECURE STRAP 25 ABSORB (INSTRUMENTS) ×2 IMPLANT
DEVICE TROCAR PUNCTURE CLOSURE (ENDOMECHANICALS) ×2 IMPLANT
ELECT REM PT RETURN 9FT ADLT (ELECTROSURGICAL) ×2
ELECTRODE REM PT RTRN 9FT ADLT (ELECTROSURGICAL) ×2 IMPLANT
GLOVE BIO SURGEON STRL SZ7.5 (GLOVE) ×2 IMPLANT
GOWN STRL REUS W/ TWL LRG LVL3 (GOWN DISPOSABLE) ×4 IMPLANT
GOWN STRL REUS W/ TWL XL LVL3 (GOWN DISPOSABLE) ×2 IMPLANT
GOWN STRL REUS W/TWL LRG LVL3 (GOWN DISPOSABLE) ×4
GOWN STRL REUS W/TWL XL LVL3 (GOWN DISPOSABLE) ×2
KIT BASIN OR (CUSTOM PROCEDURE TRAY) ×2 IMPLANT
KIT TURNOVER KIT B (KITS) ×2 IMPLANT
MESH VENTRALIGHT ST 7X9N (Mesh General) IMPLANT
NDL INSUFFLATION 14GA 120MM (NEEDLE) ×2 IMPLANT
NEEDLE INSUFFLATION 14GA 120MM (NEEDLE) ×2 IMPLANT
NS IRRIG 1000ML POUR BTL (IV SOLUTION) ×2 IMPLANT
PAD ARMBOARD 7.5X6 YLW CONV (MISCELLANEOUS) ×4 IMPLANT
POUCH LAPAROSCOPIC INSTRUMENT (MISCELLANEOUS) ×2 IMPLANT
SCISSORS LAP 5X35 DISP (ENDOMECHANICALS) ×2 IMPLANT
SET TUBE SMOKE EVAC HIGH FLOW (TUBING) ×2 IMPLANT
SHEARS HARMONIC ACE PLUS 36CM (ENDOMECHANICALS) IMPLANT
SLEEVE Z-THREAD 5X100MM (TROCAR) ×2 IMPLANT
SUT CHROMIC 2 0 SH (SUTURE) ×2 IMPLANT
SUT ETHIBOND 0 MO6 C/R (SUTURE) IMPLANT
SUT MNCRL AB 4-0 PS2 18 (SUTURE) ×2 IMPLANT
SUT NOVA 1 T20/GS 25DT (SUTURE) IMPLANT
SUT PROLENE 2 0 KS (SUTURE) IMPLANT
TOWEL GREEN STERILE (TOWEL DISPOSABLE) ×2 IMPLANT
TOWEL GREEN STERILE FF (TOWEL DISPOSABLE) ×2 IMPLANT
TRAY LAPAROSCOPIC MC (CUSTOM PROCEDURE TRAY) ×2 IMPLANT
TROCAR 11X100 Z THREAD (TROCAR) IMPLANT
TROCAR Z-THREAD OPTICAL 5X100M (TROCAR) ×2 IMPLANT
WARMER LAPAROSCOPE (MISCELLANEOUS) ×2 IMPLANT
WATER STERILE IRR 1000ML POUR (IV SOLUTION) ×2 IMPLANT

## 2022-02-02 NOTE — Transfer of Care (Signed)
Immediate Anesthesia Transfer of Care Note  Patient: Collin Morse  Procedure(s) Performed: LAPAROSCOPIC VENTRAL HERNIA WITH MESH INSERTION OF MESH (Abdomen)  Patient Location: PACU  Anesthesia Type:General  Level of Consciousness: awake, alert , and oriented  Airway & Oxygen Therapy: Patient Spontanous Breathing and Patient connected to face mask oxygen  Post-op Assessment: Report given to RN and Post -op Vital signs reviewed and stable  Post vital signs: Reviewed and stable  Last Vitals:  Vitals Value Taken Time  BP 116/69 02/02/22 1518  Temp    Pulse 76 02/02/22 1521  Resp 25 02/02/22 1521  SpO2 92 % 02/02/22 1521  Vitals shown include unvalidated device data.  Last Pain:  Vitals:   02/02/22 1134  TempSrc:   PainSc: 0-No pain         Complications: No notable events documented.

## 2022-02-02 NOTE — Discharge Instructions (Signed)
[facility] _______[facility] ? ?INGUINAL HERNIA REPAIR: POST OP INSTRUCTIONS ? ?Always review your discharge instruction sheet given to you by the facility where your surgery was performed. ?IF YOU HAVE DISABILITY OR FAMILY LEAVE FORMS, YOU MUST BRING THEM TO THE OFFICE FOR PROCESSING.   ?DO NOT GIVE THEM TO YOUR DOCTOR. ? ?1. A  prescription for pain medication may be given to you upon discharge.  Take your pain medication as prescribed, if needed.  If narcotic pain medicine is not needed, then you may take acetaminophen (Tylenol) or ibuprofen (Advil) as needed. ?2. Take your usually prescribed medications unless otherwise directed. ?If you need a refill on your pain medication, please contact your pharmacy.  They will contact our office to request authorization. Prescriptions will not be filled after 5 pm or on week-ends. ?3. You should follow a light diet the first 24 hours after arrival home, such as soup and crackers, etc.  Be sure to include lots of fluids daily.  Resume your normal diet the day after surgery. ?4.Most patients will experience some swelling and bruising around the umbilicus or in the groin and scrotum.  Ice packs and reclining will help.  Swelling and bruising can take several days to resolve.  ?6. It is common to experience some constipation if taking pain medication after surgery.  Increasing fluid intake and taking a stool softener (such as Colace) will usually help or prevent this problem from occurring.  A mild laxative (Milk of Magnesia or Miralax) should be taken according to package directions if there are no bowel movements after 48 hours. ?7. Unless discharge instructions indicate otherwise, you may remove your bandages 24-48 hours after surgery, and you may shower at that time.  You may have steri-strips (small skin tapes) in place directly over the incision.  These strips should be left on the skin for 7-10 days.  If your surgeon used skin glue on the incision, you may  shower in 24 hours.  The glue will flake off over the next 2-3 weeks.  Any sutures or staples will be removed at the office during your follow-up visit. ?8. ACTIVITIES:  You may resume regular (light) daily activities beginning the next day--such as daily self-care, walking, climbing stairs--gradually increasing activities as tolerated.  You may have sexual intercourse when it is comfortable.  Refrain from any heavy lifting or straining until approved by your doctor. ? ?a.You may drive when you are no longer taking prescription pain medication, you can comfortably wear a seatbelt, and you can safely maneuver your car and apply brakes. ?b.RETURN TO WORK:   ?_____________________________________________ ? ?9.You should see your doctor in the office for a follow-up appointment approximately 2-3 weeks after your surgery.  Make sure that you call for this appointment within a day or two after you arrive home to insure a convenient appointment time. ?10.OTHER INSTRUCTIONS: _________________________ ?   _____________________________________ ? ?WHEN TO CALL YOUR DOCTOR: ?Fever over 101.0 ?Inability to urinate ?Nausea and/or vomiting ?Extreme swelling or bruising ?Continued bleeding from incision. ?Increased pain, redness, or drainage from the incision ? ?The clinic staff is available to answer your questions during regular business hours.  Please don?t hesitate to call and ask to speak to one of the nurses for clinical concerns.  If you have a medical emergency, go to the nearest emergency room or call 911.  A surgeon from [facility] is always on call at the hospital ? ? ?[address], [address], [place], [place]    [address] ? ? [address] [address], [place], [place]   [address] ?[phone] ? [phone] ? FAX [phone] ?Web site: [email or web address] ? ?

## 2022-02-02 NOTE — Anesthesia Procedure Notes (Signed)
Procedure Name: Intubation Date/Time: 02/02/2022 2:34 PM  Performed by: Alain Marion, CRNAPre-anesthesia Checklist: Patient identified, Emergency Drugs available, Suction available and Patient being monitored Patient Re-evaluated:Patient Re-evaluated prior to induction Oxygen Delivery Method: Circle System Utilized Preoxygenation: Pre-oxygenation with 100% oxygen Induction Type: IV induction Ventilation: Mask ventilation without difficulty Laryngoscope Size: Mac and 4 Grade View: Grade I Tube type: Oral Tube size: 7.5 mm Number of attempts: 1 Airway Equipment and Method: Stylet Placement Confirmation: ETT inserted through vocal cords under direct vision, positive ETCO2 and breath sounds checked- equal and bilateral Secured at: 22 cm Tube secured with: Tape Dental Injury: Teeth and Oropharynx as per pre-operative assessment  Comments: Intubation performed by paramedic student, Jeremy Johann

## 2022-02-02 NOTE — Op Note (Signed)
02/02/2022  3:06 PM  PATIENT:  Collin Morse  72 y.o. male  PRE-OPERATIVE DIAGNOSIS:  INCISIONAL HERNIA  POST-OPERATIVE DIAGNOSIS:  INCISIONAL HERNIA  PROCEDURE:  Procedure(s): LAPAROSCOPIC VENTRAL HERNIA WITH MESH (N/A) INSERTION OF MESH (N/A)  SURGEON:  Surgeon(s) and Role:    * Ralene Ok, MD - Primary   ASSISTANTS: Pryor Curia, RNFA   ANESTHESIA:   local and general  EBL:  minimal   BLOOD ADMINISTERED:none  DRAINS: none   LOCAL MEDICATIONS USED:  BUPIVICAINE   SPECIMEN:  No Specimen  DISPOSITION OF SPECIMEN:  N/A  COUNTS:  YES  TOURNIQUET:  * No tourniquets in log *  DICTATION: .Dragon Dictation  Findings: 5cm incisional hernia Non incarcerated Primary   Details of the procedure:  After the patient was consented patient was taken back to the operating room patient was then placed in supine position bilateral SCDs in place.  The patient was prepped and draped in the usual sterile fashion. After antibiotics were confirmed a timeout was called and all facts were verified. The Veress needle technique was used to insuflate the abdomen at Palmer's point. The abdomen was insufflated to 14 mm mercury. Subsequently a 5 mm trocar was placed a camera inserted there was no injury to any intra-abdominal organs.    There was seen to be an non-incarcerated  5cm hernia with swiss cheese defects..  A second camera port was in placed into the left lower quadrant.   At this the Falicform ligament was taken down with Bovie cautery maintaining hemostasis.    I proceeded to reduce the hernia contents which consists of omentum.   The fascia at the hernia was reapproximated using a #1 Ethibonds x 1 at the biggest hernia site.  Once the hernia was cleared away, a Bard Ventralight 17x23cm  mesh was inserted into the abdomen.  The mesh was secured circumferentially with am Securestrap tacker in a double crown fashion.  The omentum was brought over the area of the mesh. The  pneumoperitoneum was evacuated  & all trocars  were removed. The skin was reapproximated with 4-0  Monocryl sutures in a subcuticular fashion. The skin was dressed with Dermabond.  The patient was taken to the recovery room in stable condition.  Type of repair -primary suture & mesh  Mesh overlap - 8cm  Placement of mesh -  beneath fascia and into peritoneal cavity  Size: 3-10cm, Primary Hernia, and Reducible Hernia    PLAN OF CARE: Discharge to home after PACU  PATIENT DISPOSITION:  PACU - hemodynamically stable.   Delay start of Pharmacological VTE agent (>24hrs) due to surgical blood loss or risk of bleeding: not applicable

## 2022-02-02 NOTE — Anesthesia Postprocedure Evaluation (Signed)
Anesthesia Post Note  Patient: Collin Morse  Procedure(s) Performed: LAPAROSCOPIC VENTRAL HERNIA WITH MESH INSERTION OF MESH (Abdomen)     Patient location during evaluation: PACU Anesthesia Type: General Level of consciousness: awake and alert and oriented Pain management: pain level controlled Vital Signs Assessment: post-procedure vital signs reviewed and stable Respiratory status: spontaneous breathing, nonlabored ventilation and respiratory function stable Cardiovascular status: blood pressure returned to baseline and stable Postop Assessment: no apparent nausea or vomiting Anesthetic complications: no   No notable events documented.  Last Vitals:  Vitals:   02/02/22 1645 02/02/22 1700  BP: 113/79 133/68  Pulse: 79 83  Resp: 16 14  Temp:  36.6 C  SpO2: 97% 94%    Last Pain:  Vitals:   02/02/22 1645  TempSrc:   PainSc: Asleep                 Alisson Rozell A.

## 2022-02-02 NOTE — Interval H&P Note (Signed)
History and Physical Interval Note:  02/02/2022 2:03 PM  Collin Morse  has presented today for surgery, with the diagnosis of INCISIONAL HERNIA.  The various methods of treatment have been discussed with the patient and family. After consideration of risks, benefits and other options for treatment, the patient has consented to  Procedure(s): [place] (N/A) as a surgical intervention.  The patient's history has been reviewed, patient examined, no change in status, stable for surgery.  I have reviewed the patient's chart and labs.  Questions were answered to the patient's satisfaction.     Ralene Ok

## 2022-02-03 ENCOUNTER — Encounter (HOSPITAL_COMMUNITY): Payer: Self-pay | Admitting: General Surgery

## 2022-02-13 ENCOUNTER — Other Ambulatory Visit: Payer: Self-pay | Admitting: Pharmacist

## 2022-02-13 MED ORDER — OZEMPIC (0.25 OR 0.5 MG/DOSE) 2 MG/1.5ML ~~LOC~~ SOPN: 0.5000 mg | PEN_INJECTOR | SUBCUTANEOUS | 11 refills | Status: DC

## 2022-02-13 NOTE — Progress Notes (Signed)
Pt dropped off form saying Ozempic rx needed ICD10 code for pharmacy to process. I called his pharmacy, they stated ICD10 code has to be on the rx, can't verbally tell them. Sent in new rx with ICD10 code. Pt aware.

## 2022-02-21 ENCOUNTER — Other Ambulatory Visit: Payer: Self-pay | Admitting: Cardiology

## 2022-03-01 ENCOUNTER — Other Ambulatory Visit: Payer: Self-pay | Admitting: Cardiology

## 2022-03-03 ENCOUNTER — Other Ambulatory Visit: Payer: Self-pay | Admitting: Cardiology

## 2022-03-13 ENCOUNTER — Telehealth: Payer: Self-pay | Admitting: Pharmacist

## 2022-03-13 MED ORDER — SEMAGLUTIDE(0.25 OR 0.5MG/DOS) 2 MG/3ML ~~LOC~~ SOPN: PEN_INJECTOR | SUBCUTANEOUS | 1 refills | Status: DC

## 2022-03-13 NOTE — Telephone Encounter (Signed)
Pt left message having issues with pharmacy not running Ozempic rx for him. The last I spoke with pt about this was 1 month ago when the pharmacy told him that rx needed an ICD10 code which I provided to the pharmacy in a new rx (they said they couldn't take info verbally apparently).   Pt states he's been off of Ozempic for the past month. Reports he had post surgical hernia so probably made sense to be off anyway then.   Looks like med requires PA now, we had not received requests from his pharmacy about this. KeyRH:[id number], approved immediately through 01/09/23. Pt aware and will restart at 0.'25mg'$  weekly x 4 weeks, then increase to 0.'5mg'$  weekly x 6 weeks. Will touch base at that time for further dose titration.

## 2022-03-27 ENCOUNTER — Other Ambulatory Visit: Payer: Self-pay | Admitting: Cardiology

## 2022-03-27 DIAGNOSIS — E782 Mixed hyperlipidemia: Secondary | ICD-10-CM

## 2022-03-27 DIAGNOSIS — I25709 Atherosclerosis of coronary artery bypass graft(s), unspecified, with unspecified angina pectoris: Secondary | ICD-10-CM

## 2022-05-04 ENCOUNTER — Telehealth: Payer: Self-pay | Admitting: Pharmacist

## 2022-05-04 ENCOUNTER — Other Ambulatory Visit: Payer: Self-pay | Admitting: Cardiology

## 2022-05-04 MED ORDER — SEMAGLUTIDE (1 MG/DOSE) 4 MG/3ML ~~LOC~~ SOPN: 1.0000 mg | PEN_INJECTOR | SUBCUTANEOUS | 0 refills | Status: DC

## 2022-05-04 NOTE — Telephone Encounter (Signed)
Called pt to f/u with Ozempic. Some GI upset, reports usually after he eats a fuller breakfast that includes bacon. Discussed eating smaller more frequent portions throughout the day and trying to minimize greasy foods since they can upset the stomach more. Reports he's down to around 200 lbs now which is where he was when he stopped Ozempic the last time. Sees his PCP soon and will ask them to recheck his A1c. Will increase dose to  for the next month, pt seems like he may want to stay at that dose rather than pushing up to , will reassess in 1 month with tolerability update.

## 2022-05-15 ENCOUNTER — Ambulatory Visit: Payer: Medicare PPO | Admitting: Podiatry

## 2022-05-15 ENCOUNTER — Ambulatory Visit: Payer: Medicare PPO

## 2022-05-15 DIAGNOSIS — L84 Corns and callosities: Secondary | ICD-10-CM | POA: Diagnosis not present

## 2022-05-15 DIAGNOSIS — R52 Pain, unspecified: Secondary | ICD-10-CM

## 2022-05-15 DIAGNOSIS — D689 Coagulation defect, unspecified: Secondary | ICD-10-CM

## 2022-05-15 DIAGNOSIS — D169 Benign neoplasm of bone and articular cartilage, unspecified: Secondary | ICD-10-CM

## 2022-05-15 NOTE — Progress Notes (Signed)
Subjective:   Patient ID: Collin Morse, male   DOB: 72 y.o.   MRN: [id number]   HPI Patient presents with severe pain on the inside of the fifth digit right against the fourth digit with lesion formation and knows he will probably need surgery for this but would like to wait due to his golf schedule   ROS      Objective:  Physical Exam  Neurovascular status intact with patient found to have severe keratotic lesion digit 5 right inner side with irritation also of the fourth digit with possible spur formation.  Patient has rotation of the fifth digit noted pressing against the fourth toe     Assessment:  Significant structural malalignment of the fifth digit leading to pressure compression against the fourth toe with lesion formation present      Plan:  H&P reviewed and discussed exostectomy possible derotational arthroplasty that might be necessary.  He does want to get this done but would like to wait till the fall and at this point I did do Sharp sterile debridement of keratotic tissue with patient also being on blood thinner making him high risk.  Applies cushion to the area and reappoint as symptoms indicate  X-rays indicate rotation of the fifth digit right pressure between the fifth toe fourth toe with pain

## 2022-05-31 ENCOUNTER — Telehealth: Payer: Self-pay | Admitting: Pharmacist

## 2022-05-31 MED ORDER — SEMAGLUTIDE (1 MG/DOSE) 4 MG/3ML ~~LOC~~ SOPN: 1.0000 mg | PEN_INJECTOR | SUBCUTANEOUS | 11 refills | Status: DC

## 2022-05-31 NOTE — Telephone Encounter (Signed)
Called pt to f/u with Ozempic tolerability. Reports he has only given 1 dose of 1mg  so far. Seeing some weight loss, 203 lbs recently. Seeing reduction in appetite but still having cravings for sweets, some GI upset but ok. Sees his PCP first part of June and will have A1c rechecked then. Reports if his A1c is well controlled, will want to stay on 1mg  dose rather than titrate to 2mg  since he's also had some GI upset with med. I have sent in refills of 1mg  dose, advised him to let me know if he wishes to increase dose in the future.

## 2022-06-15 ENCOUNTER — Other Ambulatory Visit: Payer: Self-pay | Admitting: Cardiology

## 2022-06-21 ENCOUNTER — Encounter: Payer: Self-pay | Admitting: Family Medicine

## 2022-06-21 LAB — LAB REPORT - SCANNED: A1c: 6

## 2022-07-03 ENCOUNTER — Ambulatory Visit: Payer: Medicare PPO | Admitting: Podiatry

## 2022-07-03 ENCOUNTER — Encounter: Payer: Self-pay | Admitting: Podiatry

## 2022-07-03 VITALS — BP 153/75 | HR 63

## 2022-07-03 DIAGNOSIS — D169 Benign neoplasm of bone and articular cartilage, unspecified: Secondary | ICD-10-CM | POA: Diagnosis not present

## 2022-07-04 NOTE — Progress Notes (Signed)
Subjective:   Patient ID: Collin Morse, male   DOB: 72 y.o.   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  Explained risk after reading he signed today I did courtesy debridement applied padding to help take pressure off this and hopefully we can get him through the summer before surgery will be done in the office with patient encouraged to call questions concerns prior to procedure

## 2022-07-25 ENCOUNTER — Telehealth: Payer: Self-pay | Admitting: Urology

## 2022-07-25 NOTE — Telephone Encounter (Signed)
OFFICE DOS 08/14/22  EXOSTECTOMY 5TH RIGHT --- 24401  [facility]   PER [facility] [employer]BSITE FOR CPT CODE 02725 NO PRIOR AUTH IS REQUIRED.  Tracking [phone]

## 2022-08-14 ENCOUNTER — Ambulatory Visit: Payer: Medicare PPO | Admitting: Podiatry

## 2022-08-14 DIAGNOSIS — D169 Benign neoplasm of bone and articular cartilage, unspecified: Secondary | ICD-10-CM

## 2022-08-14 DIAGNOSIS — D1631 Benign neoplasm of short bones of right lower limb: Secondary | ICD-10-CM | POA: Diagnosis not present

## 2022-08-14 NOTE — Progress Notes (Signed)
Subjective:   Patient ID: Collin Morse, male   DOB: 72 y.o.   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  Patient taken to the OR sterile prep done and I then went ahead and applied Ace wrap tourniquet inflated to exsanguinate the foot.  I then exposed the medial distal portion fifth digit right where there is a lesion I did a sterile prep of this I then excised this with a semielliptical incision exposed bone and utilizing a sidecutting bone rasp I rasped the area smoothed to a paced flush this out and then applied sterile dressing after sutured with 5-0 nylon.  Tourniquet released capillary fill noted to be immediate to all digits on the toe applied surgical shoe reappoint to recheck

## 2022-08-28 ENCOUNTER — Encounter: Payer: Medicare PPO | Admitting: Podiatry

## 2022-08-30 ENCOUNTER — Ambulatory Visit (INDEPENDENT_AMBULATORY_CARE_PROVIDER_SITE_OTHER): Payer: Medicare PPO | Admitting: Podiatry

## 2022-08-30 ENCOUNTER — Encounter: Payer: Self-pay | Admitting: Podiatry

## 2022-08-30 ENCOUNTER — Ambulatory Visit (INDEPENDENT_AMBULATORY_CARE_PROVIDER_SITE_OTHER): Payer: Medicare PPO

## 2022-08-30 DIAGNOSIS — R52 Pain, unspecified: Secondary | ICD-10-CM

## 2022-08-30 DIAGNOSIS — M79671 Pain in right foot: Secondary | ICD-10-CM

## 2022-08-30 NOTE — Progress Notes (Signed)
Subjective:   Patient ID: Collin Morse, male   DOB: 72 y.o.   MRN: [id number]   HPI Patient presents stating doing well with surgery minimal discomfort   ROS      Objective:  Physical Exam  Neurovascular status intact fifth digit right is healing well wound edges coapted well stitches intact     Assessment:  Doing well post exostectomy digit 5 right     Plan:  H&P x-ray reviewed stitches removed sterile dressing applied gradual increase in activity reappoint if any issues were to occur  X-rays indicate satisfactory resection of bone good alignment noted

## 2022-09-04 ENCOUNTER — Encounter: Payer: Medicare PPO | Admitting: Podiatry

## 2022-09-07 ENCOUNTER — Other Ambulatory Visit: Payer: Self-pay | Admitting: Cardiology

## 2022-09-07 ENCOUNTER — Telehealth: Payer: Self-pay | Admitting: Podiatry

## 2022-09-07 DIAGNOSIS — E782 Mixed hyperlipidemia: Secondary | ICD-10-CM

## 2022-09-07 DIAGNOSIS — I25709 Atherosclerosis of coronary artery bypass graft(s), unspecified, with unspecified angina pectoris: Secondary | ICD-10-CM

## 2022-09-07 NOTE — Telephone Encounter (Signed)
Patient called and noted he had surgery ~4 weeks ago.  States stitches were removed recently from his little toe, but there's one still in the toe at the edge of the incision between the 4th and 5th toes and it's getting irritated.  He was wanting to try and remove it at home today, because he's going out of town this afternoon for the holiday weekend.  He also has meetings at work today.  He said he preferred not to come in today to have it removed, so he was given instructions on how to remove it at home with tiny scissors and tweezers.  He'll call if he runs into any trouble.

## 2022-09-14 ENCOUNTER — Encounter: Payer: Self-pay | Admitting: Podiatry

## 2022-09-14 ENCOUNTER — Ambulatory Visit (INDEPENDENT_AMBULATORY_CARE_PROVIDER_SITE_OTHER): Payer: Medicare PPO | Admitting: Podiatry

## 2022-09-14 DIAGNOSIS — M542 Cervicalgia: Secondary | ICD-10-CM | POA: Diagnosis not present

## 2022-09-14 DIAGNOSIS — D169 Benign neoplasm of bone and articular cartilage, unspecified: Secondary | ICD-10-CM

## 2022-09-15 NOTE — Progress Notes (Signed)
Subjective:   Patient ID: Collin Morse, male   DOB: 72 y.o.   MRN: [id number]   HPI Patient presents with 1 stitch left right fifth toe which was removed at this time with the wound edges well coapted and patient can resume normal activity   ROS      Objective:  Physical Exam  Above eft above     Assessment:  Above     Plan:  Above

## 2022-09-19 DIAGNOSIS — C61 Malignant neoplasm of prostate: Secondary | ICD-10-CM | POA: Diagnosis not present

## 2022-09-20 DIAGNOSIS — C61 Malignant neoplasm of prostate: Secondary | ICD-10-CM | POA: Diagnosis not present

## 2022-09-20 DIAGNOSIS — N5231 Erectile dysfunction following radical prostatectomy: Secondary | ICD-10-CM | POA: Diagnosis not present

## 2022-10-03 NOTE — Progress Notes (Unsigned)
Cardiology Office Note    Patient Name: Collin Morse Date of Encounter: 10/03/2022  Primary Care Provider:  Jarrett Soho, PA-C Primary Cardiologist:  Donato Schultz, MD Primary Electrophysiologist: None   Past Medical History    Past Medical History:  Diagnosis Date   Coronary artery disease 2011   Coronary atherosclerosis of native coronary artery 12/18/2012   CABG LIMA to LAD, SVG to circumflex 01/2009   Dizziness and giddiness    GERD (gastroesophageal reflux disease)    Hernia    HTN (hypertension) since 2008   Hyperlipemia    Hypothyroidism    Prostate cancer (HCC)     History of Present Illness  Collin Morse is a 72 y.o. male with a PMH of CAD s/p CABG x 2  in 2011 with LIMA to LAD, SVG to circumflex and relook cath 2022 with patent grafts, HTN, HLD, hypothyroidism, DM type II prostate CA, GERD who presents today for racing heart rate and BP.  [name] has been followed by Dr. Anne Fu for greater than 10 years for management of CAD and HTN.  He had bypass in 2011 and relook cath in 2022 that showed patent grafts.  He developed aches with Repatha and is currently on ezetimibe and Crestor.  He was last seen by Dr. Anne Fu on 12/14/2021 and reported doing well with no significant anginal symptoms.  He was managing BP well and no new medication changes were made at that time.  He uses Cialis while on vacations and does not use daily.  He is also currently on Nitrostat as needed.  During today's visit the patient reports*** .  Patient denies chest pain, palpitations, dyspnea, PND, orthopnea, nausea, vomiting, dizziness, syncope, edema, weight gain, or early satiety.  ***Notes: -Last ischemic evaluation: -Last echo: -Interim ED visits: Review of Systems  Please see the history of present illness.    All other systems reviewed and are otherwise negative except as noted above.  Physical Exam    Wt Readings from Last 3 Encounters:  02/02/22 205 lb (93 kg)  12/14/21 210  lb (95.3 kg)  02/15/21 220 lb 12.8 oz (100.2 kg)   UX:LKGMW were no vitals filed for this visit.,There is no height or weight on file to calculate BMI. GEN: Well nourished, well developed in no acute distress Neck: No JVD; No carotid bruits Pulmonary: Clear to auscultation without rales, wheezing or rhonchi  Cardiovascular: Normal rate. Regular rhythm. Normal S1. Normal S2.   Murmurs: There is no murmur.  ABDOMEN: Soft, non-tender, non-distended EXTREMITIES:  No edema; No deformity   EKG/LABS/ Recent Cardiac Studies   ECG personally reviewed by me today - ***  Risk Assessment/Calculations:   {Does this patient have ATRIAL FIBRILLATION?:[id number]}      Lab Results  Component Value Date   WBC 10.1 02/02/2022   HGB 15.3 02/02/2022   HCT 47.6 02/02/2022   MCV 103.5 (H) 02/02/2022   PLT 214 02/02/2022   Lab Results  Component Value Date   CREATININE 1.22 02/02/2022   BUN 18 02/02/2022   NA 133 (L) 02/02/2022   K 4.0 02/02/2022   CL 100 02/02/2022   CO2 23 02/02/2022   Lab Results  Component Value Date   CHOL 149 12/14/2021   HDL 43 12/14/2021   LDLCALC 70 12/14/2021   TRIG 222 (H) 12/14/2021   CHOLHDL 3.5 12/14/2021    Lab Results  Component Value Date   HGBA1C 6.0 05/15/2013   Assessment & Plan    1.  Coronary artery disease: -s/p CABG x 2 in 2014 with relook cath and 2022 showing patent stents -Today patient reports***  2.  Essential hypertension: -Patient's blood pressure today was***  3.  Hyperlipidemia: -Patient's last LDL cholesterol was***  4.  DM type II: -Patient's hemoglobin A1c was***  5.  History of esophageal spasm:      Disposition: Follow-up with Donato Schultz, MD or APP in *** months {Are you ordering a CV Procedure (e.g. stress test, cath, DCCV, TEE, etc)?   Press F2        :[id number]}   Signed, Napoleon Form, Leodis Rains, NP 10/03/2022, 10:24 AM [facility]

## 2022-10-04 ENCOUNTER — Telehealth (HOSPITAL_BASED_OUTPATIENT_CLINIC_OR_DEPARTMENT_OTHER): Payer: Self-pay

## 2022-10-04 ENCOUNTER — Other Ambulatory Visit: Payer: Self-pay | Admitting: Nurse Practitioner

## 2022-10-04 ENCOUNTER — Ambulatory Visit (INDEPENDENT_AMBULATORY_CARE_PROVIDER_SITE_OTHER): Payer: Medicare PPO

## 2022-10-04 ENCOUNTER — Ambulatory Visit: Payer: Medicare PPO | Attending: Nurse Practitioner | Admitting: Nurse Practitioner

## 2022-10-04 ENCOUNTER — Encounter: Payer: Self-pay | Admitting: Nurse Practitioner

## 2022-10-04 VITALS — BP 132/74 | HR 63 | Ht 73.0 in | Wt 203.0 lb

## 2022-10-04 DIAGNOSIS — E782 Mixed hyperlipidemia: Secondary | ICD-10-CM

## 2022-10-04 DIAGNOSIS — R000 Tachycardia, unspecified: Secondary | ICD-10-CM

## 2022-10-04 DIAGNOSIS — R002 Palpitations: Secondary | ICD-10-CM

## 2022-10-04 DIAGNOSIS — I10 Essential (primary) hypertension: Secondary | ICD-10-CM

## 2022-10-04 DIAGNOSIS — Z8679 Personal history of other diseases of the circulatory system: Secondary | ICD-10-CM | POA: Diagnosis not present

## 2022-10-04 DIAGNOSIS — I25709 Atherosclerosis of coronary artery bypass graft(s), unspecified, with unspecified angina pectoris: Secondary | ICD-10-CM

## 2022-10-04 DIAGNOSIS — K224 Dyskinesia of esophagus: Secondary | ICD-10-CM | POA: Diagnosis not present

## 2022-10-04 DIAGNOSIS — K2270 Barrett's esophagus without dysplasia: Secondary | ICD-10-CM | POA: Diagnosis not present

## 2022-10-04 DIAGNOSIS — Z86010 Personal history of colonic polyps: Secondary | ICD-10-CM | POA: Diagnosis not present

## 2022-10-04 DIAGNOSIS — E119 Type 2 diabetes mellitus without complications: Secondary | ICD-10-CM | POA: Diagnosis not present

## 2022-10-04 DIAGNOSIS — K219 Gastro-esophageal reflux disease without esophagitis: Secondary | ICD-10-CM | POA: Diagnosis not present

## 2022-10-04 NOTE — Telephone Encounter (Signed)
Pre-operative Risk Assessment    Patient Name: Collin Morse  DOB: 1951-01-01 MRN: [id number]      Request for Surgical Clearance    Procedure:   Colonoscopy/Endoscopy  Date of Surgery:  Clearance 10/31/22                                 Surgeon:  Dr. Kathi Der Surgeon's Group or Practice Name:  [facility] Phone number:  [phone] Fax number:  [phone]   Type of Clearance Requested:   - Medical  - Pharmacy:  Hold Clopidogrel (Plavix) requesting 5 day hold prior   Type of Anesthesia:   Propofol   Additional requests/questions:    Signed, Zada Finders   10/04/2022, 12:12 PM

## 2022-10-04 NOTE — Telephone Encounter (Signed)
Patient Name: Collin Morse  DOB: 11-May-1950 MRN: [id number]  Primary Cardiologist: Donato Schultz, MD  Chart reviewed as part of pre-operative protocol coverage. Given past medical history and time since last visit, based on ACC/AHA guidelines, [name] is at acceptable risk for the planned procedure without further cardiovascular testing.   -Patient can hold Plavix 5 days prior to scheduled procedure and should restart postprocedure when surgically safe.  Please advise patient to continue aspirin 81 mg throughout the preoperative period.  The patient was advised that if he develops new symptoms prior to surgery to contact our office to arrange for a follow-up visit, and he verbalized understanding.  I will route this recommendation to the requesting party via Epic fax function and remove from pre-op pool.  Please call with questions.  Napoleon Form, Leodis Rains, NP 10/04/2022, 12:47 PM

## 2022-10-04 NOTE — Progress Notes (Unsigned)
ZIO XT serial # [id number] from office inventory applied to patient using tincture of benzoin.

## 2022-10-04 NOTE — Patient Instructions (Addendum)
Medication Instructions:  Your physician recommends that you continue on your current medications as directed. Please refer to the Current Medication list given to you today.  *If you need a refill on your cardiac medications before your next appointment, please call your pharmacy*  Lab Work: None ordered.  If you have labs (blood work) drawn today and your tests are completely normal, you will receive your results only by: MyChart Message (if you have MyChart) OR A paper copy in the mail If you have any lab test that is abnormal or we need to change your treatment, we will call you to review the results.  Testing/Procedures: Your physician has requested that you wear a Zio heart monitor for 7 days. This will be mailed to your home with instructions on how to apply the monitor and how to return it when finished. Please allow 2 weeks after returning the heart monitor before our office calls you with the results.   Follow-Up: At [facility], you and your health needs are our priority.  As part of our continuing mission to provide you with exceptional heart care, we have created designated Provider Care Teams.  These Care Teams include your primary Cardiologist (physician) and Advanced Practice Providers (APPs -  Physician Assistants and Nurse Practitioners) who all work together to provide you with the care you need, when you need it.   Your next appointment:   1 year(s)  The format for your next appointment:   In Person     Important Information About Sugar

## 2022-10-12 DIAGNOSIS — M542 Cervicalgia: Secondary | ICD-10-CM | POA: Diagnosis not present

## 2022-10-17 DIAGNOSIS — L570 Actinic keratosis: Secondary | ICD-10-CM | POA: Diagnosis not present

## 2022-10-17 DIAGNOSIS — D485 Neoplasm of uncertain behavior of skin: Secondary | ICD-10-CM | POA: Diagnosis not present

## 2022-10-17 DIAGNOSIS — I10 Essential (primary) hypertension: Secondary | ICD-10-CM | POA: Diagnosis not present

## 2022-10-17 DIAGNOSIS — L84 Corns and callosities: Secondary | ICD-10-CM | POA: Diagnosis not present

## 2022-10-17 DIAGNOSIS — L409 Psoriasis, unspecified: Secondary | ICD-10-CM | POA: Diagnosis not present

## 2022-10-17 DIAGNOSIS — D045 Carcinoma in situ of skin of trunk: Secondary | ICD-10-CM | POA: Diagnosis not present

## 2022-10-17 DIAGNOSIS — R000 Tachycardia, unspecified: Secondary | ICD-10-CM | POA: Diagnosis not present

## 2022-10-31 DIAGNOSIS — M542 Cervicalgia: Secondary | ICD-10-CM | POA: Diagnosis not present

## 2022-11-15 DIAGNOSIS — M542 Cervicalgia: Secondary | ICD-10-CM | POA: Diagnosis not present

## 2022-11-17 DIAGNOSIS — K2961 Other gastritis with bleeding: Secondary | ICD-10-CM | POA: Diagnosis not present

## 2022-11-17 DIAGNOSIS — K635 Polyp of colon: Secondary | ICD-10-CM | POA: Diagnosis not present

## 2022-11-17 DIAGNOSIS — K219 Gastro-esophageal reflux disease without esophagitis: Secondary | ICD-10-CM | POA: Diagnosis not present

## 2022-11-17 DIAGNOSIS — K2289 Other specified disease of esophagus: Secondary | ICD-10-CM | POA: Diagnosis not present

## 2022-11-17 DIAGNOSIS — Z09 Encounter for follow-up examination after completed treatment for conditions other than malignant neoplasm: Secondary | ICD-10-CM | POA: Diagnosis not present

## 2022-11-17 DIAGNOSIS — K2930 Chronic superficial gastritis without bleeding: Secondary | ICD-10-CM | POA: Diagnosis not present

## 2022-11-17 DIAGNOSIS — K317 Polyp of stomach and duodenum: Secondary | ICD-10-CM | POA: Diagnosis not present

## 2022-11-17 DIAGNOSIS — K5730 Diverticulosis of large intestine without perforation or abscess without bleeding: Secondary | ICD-10-CM | POA: Diagnosis not present

## 2022-11-17 DIAGNOSIS — K2270 Barrett's esophagus without dysplasia: Secondary | ICD-10-CM | POA: Diagnosis not present

## 2022-11-17 DIAGNOSIS — Z860101 Personal history of adenomatous and serrated colon polyps: Secondary | ICD-10-CM | POA: Diagnosis not present

## 2022-11-23 DIAGNOSIS — K317 Polyp of stomach and duodenum: Secondary | ICD-10-CM | POA: Diagnosis not present

## 2022-11-23 DIAGNOSIS — K2930 Chronic superficial gastritis without bleeding: Secondary | ICD-10-CM | POA: Diagnosis not present

## 2022-11-23 DIAGNOSIS — K219 Gastro-esophageal reflux disease without esophagitis: Secondary | ICD-10-CM | POA: Diagnosis not present

## 2022-11-23 DIAGNOSIS — K635 Polyp of colon: Secondary | ICD-10-CM | POA: Diagnosis not present

## 2022-11-28 DIAGNOSIS — D045 Carcinoma in situ of skin of trunk: Secondary | ICD-10-CM | POA: Diagnosis not present

## 2022-12-04 DIAGNOSIS — M542 Cervicalgia: Secondary | ICD-10-CM | POA: Diagnosis not present

## 2022-12-12 DIAGNOSIS — N400 Enlarged prostate without lower urinary tract symptoms: Secondary | ICD-10-CM | POA: Diagnosis not present

## 2022-12-12 DIAGNOSIS — K219 Gastro-esophageal reflux disease without esophagitis: Secondary | ICD-10-CM | POA: Diagnosis not present

## 2022-12-12 DIAGNOSIS — D689 Coagulation defect, unspecified: Secondary | ICD-10-CM | POA: Diagnosis not present

## 2022-12-12 DIAGNOSIS — E785 Hyperlipidemia, unspecified: Secondary | ICD-10-CM | POA: Diagnosis not present

## 2022-12-12 DIAGNOSIS — I499 Cardiac arrhythmia, unspecified: Secondary | ICD-10-CM | POA: Diagnosis not present

## 2022-12-12 DIAGNOSIS — L409 Psoriasis, unspecified: Secondary | ICD-10-CM | POA: Diagnosis not present

## 2022-12-12 DIAGNOSIS — I10 Essential (primary) hypertension: Secondary | ICD-10-CM | POA: Diagnosis not present

## 2022-12-12 DIAGNOSIS — I252 Old myocardial infarction: Secondary | ICD-10-CM | POA: Diagnosis not present

## 2022-12-12 DIAGNOSIS — E119 Type 2 diabetes mellitus without complications: Secondary | ICD-10-CM | POA: Diagnosis not present

## 2022-12-18 DIAGNOSIS — H40013 Open angle with borderline findings, low risk, bilateral: Secondary | ICD-10-CM | POA: Diagnosis not present

## 2022-12-18 DIAGNOSIS — H35372 Puckering of macula, left eye: Secondary | ICD-10-CM | POA: Diagnosis not present

## 2022-12-18 DIAGNOSIS — H43821 Vitreomacular adhesion, right eye: Secondary | ICD-10-CM | POA: Diagnosis not present

## 2022-12-18 DIAGNOSIS — Z961 Presence of intraocular lens: Secondary | ICD-10-CM | POA: Diagnosis not present

## 2022-12-18 DIAGNOSIS — H43812 Vitreous degeneration, left eye: Secondary | ICD-10-CM | POA: Diagnosis not present

## 2022-12-28 DIAGNOSIS — M542 Cervicalgia: Secondary | ICD-10-CM | POA: Diagnosis not present

## 2023-01-09 ENCOUNTER — Telehealth: Payer: Self-pay | Admitting: Pharmacy Technician

## 2023-01-09 ENCOUNTER — Other Ambulatory Visit (HOSPITAL_COMMUNITY): Payer: Self-pay

## 2023-01-09 NOTE — Telephone Encounter (Signed)
 Pharmacy Patient Advocate Encounter   Received notification from CoverMyMeds that prior authorization for Ozempic  (0.25 or 0.5 MG/DOSE) 2MG /3ML pen-injectors is required/requested.   Insurance verification completed.   The patient is insured through [employer] .   Per test claim: The current 28 day co-pay is, $40.00.  No PA needed at this time. This test claim was processed through [facility]- copay amounts may vary at other pharmacies due to pharmacy/plan contracts, or as the patient moves through the different stages of their insurance plan.   To be noted: Pt just filled 1mg  on 12/16/22

## 2023-01-09 NOTE — Telephone Encounter (Signed)
 However, leaving open just in case to run again after the new year since per last encounter: Key: [id number], approved immediately through 01/09/23.

## 2023-01-11 DIAGNOSIS — M542 Cervicalgia: Secondary | ICD-10-CM | POA: Diagnosis not present

## 2023-01-22 DIAGNOSIS — M542 Cervicalgia: Secondary | ICD-10-CM | POA: Diagnosis not present

## 2023-02-05 DIAGNOSIS — M542 Cervicalgia: Secondary | ICD-10-CM | POA: Diagnosis not present

## 2023-02-09 DIAGNOSIS — L570 Actinic keratosis: Secondary | ICD-10-CM | POA: Diagnosis not present

## 2023-02-19 DIAGNOSIS — M542 Cervicalgia: Secondary | ICD-10-CM | POA: Diagnosis not present

## 2023-03-06 ENCOUNTER — Other Ambulatory Visit: Payer: Self-pay | Admitting: Cardiology

## 2023-03-06 DIAGNOSIS — E782 Mixed hyperlipidemia: Secondary | ICD-10-CM

## 2023-03-06 DIAGNOSIS — I25709 Atherosclerosis of coronary artery bypass graft(s), unspecified, with unspecified angina pectoris: Secondary | ICD-10-CM

## 2023-03-07 DIAGNOSIS — M542 Cervicalgia: Secondary | ICD-10-CM | POA: Diagnosis not present

## 2023-03-20 DIAGNOSIS — M542 Cervicalgia: Secondary | ICD-10-CM | POA: Diagnosis not present

## 2023-04-10 DIAGNOSIS — L814 Other melanin hyperpigmentation: Secondary | ICD-10-CM | POA: Diagnosis not present

## 2023-04-10 DIAGNOSIS — L578 Other skin changes due to chronic exposure to nonionizing radiation: Secondary | ICD-10-CM | POA: Diagnosis not present

## 2023-04-10 DIAGNOSIS — L821 Other seborrheic keratosis: Secondary | ICD-10-CM | POA: Diagnosis not present

## 2023-04-10 DIAGNOSIS — L570 Actinic keratosis: Secondary | ICD-10-CM | POA: Diagnosis not present

## 2023-04-10 DIAGNOSIS — D1801 Hemangioma of skin and subcutaneous tissue: Secondary | ICD-10-CM | POA: Diagnosis not present

## 2023-04-10 DIAGNOSIS — D0439 Carcinoma in situ of skin of other parts of face: Secondary | ICD-10-CM | POA: Diagnosis not present

## 2023-04-10 DIAGNOSIS — D485 Neoplasm of uncertain behavior of skin: Secondary | ICD-10-CM | POA: Diagnosis not present

## 2023-04-10 DIAGNOSIS — Z85828 Personal history of other malignant neoplasm of skin: Secondary | ICD-10-CM | POA: Diagnosis not present

## 2023-04-10 DIAGNOSIS — L309 Dermatitis, unspecified: Secondary | ICD-10-CM | POA: Diagnosis not present

## 2023-04-10 DIAGNOSIS — D044 Carcinoma in situ of skin of scalp and neck: Secondary | ICD-10-CM | POA: Diagnosis not present

## 2023-04-10 DIAGNOSIS — Z86018 Personal history of other benign neoplasm: Secondary | ICD-10-CM | POA: Diagnosis not present

## 2023-04-11 DIAGNOSIS — M6208 Separation of muscle (nontraumatic), other site: Secondary | ICD-10-CM | POA: Diagnosis not present

## 2023-04-11 DIAGNOSIS — M542 Cervicalgia: Secondary | ICD-10-CM | POA: Diagnosis not present

## 2023-04-18 DIAGNOSIS — S0001XA Abrasion of scalp, initial encounter: Secondary | ICD-10-CM | POA: Diagnosis not present

## 2023-04-18 DIAGNOSIS — W228XXA Striking against or struck by other objects, initial encounter: Secondary | ICD-10-CM | POA: Diagnosis not present

## 2023-04-23 DIAGNOSIS — M542 Cervicalgia: Secondary | ICD-10-CM | POA: Diagnosis not present

## 2023-05-11 ENCOUNTER — Other Ambulatory Visit: Payer: Self-pay | Admitting: Cardiology

## 2023-05-18 DIAGNOSIS — M542 Cervicalgia: Secondary | ICD-10-CM | POA: Diagnosis not present

## 2023-05-28 DIAGNOSIS — M542 Cervicalgia: Secondary | ICD-10-CM | POA: Diagnosis not present

## 2023-06-07 ENCOUNTER — Other Ambulatory Visit: Payer: Self-pay | Admitting: Cardiology

## 2023-06-18 DIAGNOSIS — M542 Cervicalgia: Secondary | ICD-10-CM | POA: Diagnosis not present

## 2023-06-20 DIAGNOSIS — E1159 Type 2 diabetes mellitus with other circulatory complications: Secondary | ICD-10-CM | POA: Diagnosis not present

## 2023-06-20 DIAGNOSIS — E785 Hyperlipidemia, unspecified: Secondary | ICD-10-CM | POA: Diagnosis not present

## 2023-06-25 DIAGNOSIS — Z6827 Body mass index (BMI) 27.0-27.9, adult: Secondary | ICD-10-CM | POA: Diagnosis not present

## 2023-06-25 DIAGNOSIS — E1159 Type 2 diabetes mellitus with other circulatory complications: Secondary | ICD-10-CM | POA: Diagnosis not present

## 2023-06-25 DIAGNOSIS — I2510 Atherosclerotic heart disease of native coronary artery without angina pectoris: Secondary | ICD-10-CM | POA: Diagnosis not present

## 2023-06-25 DIAGNOSIS — R7401 Elevation of levels of liver transaminase levels: Secondary | ICD-10-CM | POA: Diagnosis not present

## 2023-06-25 DIAGNOSIS — E782 Mixed hyperlipidemia: Secondary | ICD-10-CM | POA: Diagnosis not present

## 2023-06-25 DIAGNOSIS — Z8546 Personal history of malignant neoplasm of prostate: Secondary | ICD-10-CM | POA: Diagnosis not present

## 2023-06-25 DIAGNOSIS — K2270 Barrett's esophagus without dysplasia: Secondary | ICD-10-CM | POA: Diagnosis not present

## 2023-06-25 DIAGNOSIS — E039 Hypothyroidism, unspecified: Secondary | ICD-10-CM | POA: Diagnosis not present

## 2023-06-25 DIAGNOSIS — Z0000 Encounter for general adult medical examination without abnormal findings: Secondary | ICD-10-CM | POA: Diagnosis not present

## 2023-06-27 DIAGNOSIS — M542 Cervicalgia: Secondary | ICD-10-CM | POA: Diagnosis not present

## 2023-07-05 DIAGNOSIS — E559 Vitamin D deficiency, unspecified: Secondary | ICD-10-CM | POA: Diagnosis not present

## 2023-07-05 DIAGNOSIS — E7210 Disorders of sulfur-bearing amino-acid metabolism, unspecified: Secondary | ICD-10-CM | POA: Diagnosis not present

## 2023-07-05 DIAGNOSIS — E538 Deficiency of other specified B group vitamins: Secondary | ICD-10-CM | POA: Diagnosis not present

## 2023-07-05 DIAGNOSIS — R5383 Other fatigue: Secondary | ICD-10-CM | POA: Diagnosis not present

## 2023-07-09 DIAGNOSIS — M542 Cervicalgia: Secondary | ICD-10-CM | POA: Diagnosis not present

## 2023-07-17 DIAGNOSIS — D485 Neoplasm of uncertain behavior of skin: Secondary | ICD-10-CM | POA: Diagnosis not present

## 2023-07-17 DIAGNOSIS — C44622 Squamous cell carcinoma of skin of right upper limb, including shoulder: Secondary | ICD-10-CM | POA: Diagnosis not present

## 2023-07-17 DIAGNOSIS — L570 Actinic keratosis: Secondary | ICD-10-CM | POA: Diagnosis not present

## 2023-07-17 DIAGNOSIS — Z86007 Personal history of in-situ neoplasm of skin: Secondary | ICD-10-CM | POA: Diagnosis not present

## 2023-07-24 DIAGNOSIS — M542 Cervicalgia: Secondary | ICD-10-CM | POA: Diagnosis not present

## 2023-08-06 DIAGNOSIS — M542 Cervicalgia: Secondary | ICD-10-CM | POA: Diagnosis not present

## 2023-08-07 DIAGNOSIS — C44622 Squamous cell carcinoma of skin of right upper limb, including shoulder: Secondary | ICD-10-CM | POA: Diagnosis not present

## 2023-08-20 DIAGNOSIS — M542 Cervicalgia: Secondary | ICD-10-CM | POA: Diagnosis not present

## 2023-09-11 ENCOUNTER — Telehealth: Payer: Self-pay | Admitting: Cardiology

## 2023-09-11 DIAGNOSIS — E782 Mixed hyperlipidemia: Secondary | ICD-10-CM

## 2023-09-11 DIAGNOSIS — I25709 Atherosclerosis of coronary artery bypass graft(s), unspecified, with unspecified angina pectoris: Secondary | ICD-10-CM

## 2023-09-11 NOTE — Telephone Encounter (Signed)
 Patient would like to know if he needs labs before his visit on 11/23/23. Also, he has been seeing Pharmacist for Ozempic , wants to know if this is relevant to his appt. Please advise

## 2023-09-12 NOTE — Telephone Encounter (Signed)
 Left message for patient with Dr. Jeffrie' response:  Sure, okay to get complete metabolic profile, CBC, lipid panel, hemoglobin A1c Oneil Jeffrie, MD  Labs ordered and released to [facility]. Informed patient he can go to any [facility] location. These are fasting labs.   Provided office number for callback if any questions.

## 2023-09-13 DIAGNOSIS — M542 Cervicalgia: Secondary | ICD-10-CM | POA: Diagnosis not present

## 2023-09-19 DIAGNOSIS — C61 Malignant neoplasm of prostate: Secondary | ICD-10-CM | POA: Diagnosis not present

## 2023-09-25 DIAGNOSIS — N5231 Erectile dysfunction following radical prostatectomy: Secondary | ICD-10-CM | POA: Diagnosis not present

## 2023-09-25 DIAGNOSIS — C61 Malignant neoplasm of prostate: Secondary | ICD-10-CM | POA: Diagnosis not present

## 2023-09-27 ENCOUNTER — Telehealth: Payer: Self-pay | Admitting: Pharmacist

## 2023-09-27 ENCOUNTER — Telehealth: Payer: Self-pay | Admitting: Cardiology

## 2023-09-27 DIAGNOSIS — M542 Cervicalgia: Secondary | ICD-10-CM | POA: Diagnosis not present

## 2023-09-27 MED ORDER — NITROGLYCERIN 0.4 MG SL SUBL: 0.4000 mg | SUBLINGUAL_TABLET | SUBLINGUAL | 0 refills | Status: AC | PRN

## 2023-09-27 NOTE — Telephone Encounter (Signed)
*  STAT* If patient is at the pharmacy, call can be transferred to refill team.   1. Which medications need to be refilled? (please list name of each medication and dose if known)   nitroGLYCERIN  (NITROSTAT ) 0.4 MG SL tablet     2. Would you like to learn more about the convenience, safety, & potential cost savings by using the [facility]? No   3. Are you open to using the [facility] (Type [facility]. No   4. Which pharmacy/location (including street and city if local pharmacy) is medication to be sent to? [id number] [place], [place] - [address]    5. Do they need a 30 day or 90 day supply? 30 day

## 2023-09-27 NOTE — Telephone Encounter (Signed)
 Pt calling in regards to switching from Ozempic  to an oral semaglutide . Requesting a call back. Please advise.

## 2023-09-27 NOTE — Telephone Encounter (Signed)
 RX sent in

## 2023-09-28 NOTE — Telephone Encounter (Signed)
 Spoke with patient regarding semaglutide  therapy. Patient expressed interest in switching from injectable to oral semaglutide  for the following reasons:  Travel Considerations: Patient will be traveling for over 90 days and finds the oral formulation more convenient to carry and administer during this period. Muscle Mass Concerns: Despite consistent resistance training and adequate protein intake, patient reports significant muscle mass loss while on injectable semaglutide .  Recommendation: Advised patient to follow up with their primary care provider (PCP) to discuss the potential conversion from injectable to oral semaglutide  and to evaluate appropriateness based on clinical status and therapeutic goals.

## 2023-10-11 DIAGNOSIS — M542 Cervicalgia: Secondary | ICD-10-CM | POA: Diagnosis not present

## 2023-10-17 DIAGNOSIS — Z86008 Personal history of in-situ neoplasm of other site: Secondary | ICD-10-CM | POA: Diagnosis not present

## 2023-10-17 DIAGNOSIS — L570 Actinic keratosis: Secondary | ICD-10-CM | POA: Diagnosis not present

## 2023-10-17 DIAGNOSIS — L578 Other skin changes due to chronic exposure to nonionizing radiation: Secondary | ICD-10-CM | POA: Diagnosis not present

## 2023-10-25 DIAGNOSIS — M542 Cervicalgia: Secondary | ICD-10-CM | POA: Diagnosis not present

## 2023-11-06 DIAGNOSIS — M542 Cervicalgia: Secondary | ICD-10-CM | POA: Diagnosis not present

## 2023-11-19 DIAGNOSIS — M542 Cervicalgia: Secondary | ICD-10-CM | POA: Diagnosis not present

## 2023-11-23 ENCOUNTER — Ambulatory Visit: Admitting: Cardiology

## 2023-12-03 DIAGNOSIS — M542 Cervicalgia: Secondary | ICD-10-CM | POA: Diagnosis not present

## 2023-12-06 ENCOUNTER — Other Ambulatory Visit: Payer: Self-pay | Admitting: Cardiology

## 2023-12-12 DIAGNOSIS — M542 Cervicalgia: Secondary | ICD-10-CM | POA: Diagnosis not present

## 2023-12-12 DIAGNOSIS — M5432 Sciatica, left side: Secondary | ICD-10-CM | POA: Diagnosis not present

## 2023-12-13 ENCOUNTER — Other Ambulatory Visit: Payer: Self-pay | Admitting: Cardiology

## 2023-12-13 DIAGNOSIS — I25709 Atherosclerosis of coronary artery bypass graft(s), unspecified, with unspecified angina pectoris: Secondary | ICD-10-CM

## 2023-12-13 DIAGNOSIS — E782 Mixed hyperlipidemia: Secondary | ICD-10-CM

## 2023-12-17 DIAGNOSIS — M542 Cervicalgia: Secondary | ICD-10-CM | POA: Diagnosis not present

## 2023-12-17 DIAGNOSIS — M5432 Sciatica, left side: Secondary | ICD-10-CM | POA: Diagnosis not present

## 2024-01-08 LAB — CBC
Hematocrit: 39.7 % (ref 37.5–51.0)
Hemoglobin: 13.1 g/dL (ref 13.0–17.7)
MCH: 31.9 pg (ref 26.6–33.0)
MCHC: 33 g/dL (ref 31.5–35.7)
MCV: 97 fL (ref 79–97)
Platelets: 346 x10E3/uL (ref 150–450)
RBC: 4.11 x10E6/uL — ABNORMAL LOW (ref 4.14–5.80)
RDW: 12.1 % (ref 11.6–15.4)
WBC: 9.2 x10E3/uL (ref 3.4–10.8)

## 2024-01-08 LAB — COMPREHENSIVE METABOLIC PANEL WITH GFR
ALT: 35 IU/L (ref 0–44)
AST: 24 IU/L (ref 0–40)
Albumin: 4.2 g/dL (ref 3.8–4.8)
Alkaline Phosphatase: 49 IU/L (ref 47–123)
BUN/Creatinine Ratio: 17 (ref 10–24)
BUN: 19 mg/dL (ref 8–27)
Bilirubin Total: 0.4 mg/dL (ref 0.0–1.2)
CO2: 23 mmol/L (ref 20–29)
Calcium: 9.2 mg/dL (ref 8.6–10.2)
Chloride: 101 mmol/L (ref 96–106)
Creatinine, Ser: 1.12 mg/dL (ref 0.76–1.27)
Globulin, Total: 2.8 g/dL (ref 1.5–4.5)
Glucose: 109 mg/dL — ABNORMAL HIGH (ref 70–99)
Potassium: 5.2 mmol/L (ref 3.5–5.2)
Sodium: 140 mmol/L (ref 134–144)
Total Protein: 7 g/dL (ref 6.0–8.5)
eGFR: 69 mL/min/1.73

## 2024-01-08 LAB — LIPID PANEL
Chol/HDL Ratio: 3.2 ratio (ref 0.0–5.0)
Cholesterol, Total: 133 mg/dL (ref 100–199)
HDL: 41 mg/dL
LDL Chol Calc (NIH): 74 mg/dL (ref 0–99)
Triglycerides: 98 mg/dL (ref 0–149)
VLDL Cholesterol Cal: 18 mg/dL (ref 5–40)

## 2024-01-08 LAB — HEMOGLOBIN A1C
Est. average glucose Bld gHb Est-mCnc: 134 mg/dL
Hgb A1c MFr Bld: 6.3 % — ABNORMAL HIGH (ref 4.8–5.6)

## 2024-01-14 ENCOUNTER — Ambulatory Visit: Payer: Self-pay | Admitting: Cardiology

## 2024-01-24 ENCOUNTER — Ambulatory Visit: Attending: Cardiology | Admitting: Cardiology

## 2024-01-24 ENCOUNTER — Other Ambulatory Visit: Payer: Self-pay | Admitting: Cardiology

## 2024-01-24 VITALS — BP 122/64 | HR 66 | Ht 73.0 in | Wt 198.0 lb

## 2024-01-24 DIAGNOSIS — E782 Mixed hyperlipidemia: Secondary | ICD-10-CM | POA: Diagnosis not present

## 2024-01-24 DIAGNOSIS — I10 Essential (primary) hypertension: Secondary | ICD-10-CM

## 2024-01-24 DIAGNOSIS — I25709 Atherosclerosis of coronary artery bypass graft(s), unspecified, with unspecified angina pectoris: Secondary | ICD-10-CM | POA: Diagnosis not present

## 2024-01-24 DIAGNOSIS — E119 Type 2 diabetes mellitus without complications: Secondary | ICD-10-CM

## 2024-01-24 MED ORDER — REPATHA 140 MG/ML ~~LOC~~ SOSY: 1.0000 | PREFILLED_SYRINGE | SUBCUTANEOUS | 11 refills | Status: AC

## 2024-01-24 NOTE — Progress Notes (Signed)
 " Cardiology Office Note:  .   Date:  01/24/2024  ID:  Collin Morse, DOB 1950/06/08, MRN [id number] PCP: Katina Pfeiffer, PA-C  [facility] Providers Cardiologist:  Oneil Parchment, MD     History of Present Illness: .   Collin Morse is a 74 y.o. male Discussed the use of AI scribe   History of Present Illness Collin Morse is a 74 year old male with coronary artery disease, status post CABG, who presents for follow-up.  He underwent coronary artery bypass grafting (CABG) in January 2011 with a LIMA to LAD and SVG to circumflex. A catheterization in 2022 showed patent grafts. He is currently on aspirin , Plavix , Zetia , Toprol  75 mg daily, spironolactone  25 mg, and Crestor  40 mg. He experienced a previous episode of racing heart rate during an office visit, and an event monitor placed in October 2024 showed rare atrial tachycardia. His average heart rate is 68 beats per minute.  He has a history of hypertension and hyperlipidemia, managed with Crestor  and Zetia . He has experienced muscle-related issues, which he attributes to prolonged sitting and possibly medication side effects. He reports a significant weight loss of 28 pounds, which he attributes to the medication Zentych. He also mentions a post-surgical hernia that protrudes when he changes positions.  He describes an episode of piriformis syndrome after a long car ride, which took two months to resolve with physical therapy. Sitting for extended periods exacerbates his symptoms, and he has lost fat in his buttocks, leading to discomfort. He continues to engage in physical activities such as golf and strength training three times a week.  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 He also mentions a history of skin cancers and thin skin, which makes him cautious about bleeding risks associated with his medications.  His A1c has improved to 6.3, down from around 7, and  his LDL levels have also improved with his current medication regimen. He has tried Repatha  in the past but experienced muscle aches, which he is unsure were related to the medication.  Played golf where the rider cup was held    Studies Reviewed: SABRA   EKG Interpretation Date/Time:  Thursday January 24 2024 08:44:41 EST Ventricular Rate:  66 PR Interval:  152 QRS Duration:  110 QT Interval:  412 QTC Calculation: 431 R Axis:   -24  Text Interpretation: Normal sinus rhythm Normal ECG When compared with ECG of 07-Oct-2020 01:02, QRS axis Shifted right Nonspecific T wave abnormality no longer evident in Inferior leads T wave inversion no longer evident in Anterolateral leads Confirmed by Parchment Oneil ([id number]) on 01/24/2024 8:56:05 AM    Results Diagnostic Cardiac catheterization (2022): Patent grafts Event monitor (10/2022): Rare atrial tachycardia; average heart rate 68 bpm Risk Assessment/Calculations:            Physical Exam:   VS:  BP 122/64 (BP Location: Left Arm, Patient Position: Sitting, Cuff Size: Normal)   Pulse 66   Ht 6' 1 (1.854 m)   Wt 198 lb (89.8 kg)   SpO2 97%   BMI 26.12 kg/m    Wt Readings from Last 3 Encounters:  01/24/24 198 lb (89.8 kg)  10/04/22 203 lb (92.1 kg)  02/02/22 205 lb (93 kg)    GEN: Well nourished, well developed in no acute distress NECK: No JVD; No carotid bruits CARDIAC: RRR, no murmurs, no rubs, no gallops RESPIRATORY:  Clear to auscultation  without rales, wheezing or rhonchi  ABDOMEN: Soft, non-tender, non-distended EXTREMITIES:  No edema; No deformity   ASSESSMENT AND PLAN: .    Assessment and Plan Assessment & Plan Coronary artery disease, status post coronary artery bypass grafting Coronary artery disease with CABG in January 2011. Recent catheterization in 2022 showed patent grafts. Currently on aspirin  and Plavix . Concerns about bleeding with aspirin  due to thin skin and outdoor work. Prefers to continue aspirin  despite  bleeding concerns. - Continue aspirin  and Plavix  (only prior to allergy shots).  Mixed hyperlipidemia Managed with Crestor  and Zetia . LDL levels have improved. Previous trial of Repatha  was discontinued due to suspected muscular side effects, but he is willing to retry. Discussed potential benefits of Repatha  in lowering cholesterol levels further. - Will retry Repatha  and monitor for muscular side effects. - Continue Crestor  and Zetia .  Essential hypertension Managed with Toprol  and spironolactone .  Atrial tachycardia Rare atrial tachycardia noted on event monitor in October 2024. Previous symptoms associated with sinus rhythm. Current average heart rate is 68 bpm.  Piriformis injury - Worked with PT.  Long recovery.       Dispo: 1 yr  Signed, Oneil Parchment, MD  "

## 2024-01-24 NOTE — Patient Instructions (Signed)
 Medication Instructions:  Restart Repatha  every 14 days. Continue all other medications as listed.  *If you need a refill on your cardiac medications before your next appointment, please call your pharmacy*  Follow-Up: At [facility], you and your health needs are our priority.  As part of our continuing mission to provide you with exceptional heart care, our providers are all part of one team.  This team includes your primary Cardiologist (physician) and Advanced Practice Providers or APPs (Physician Assistants and Nurse Practitioners) who all work together to provide you with the care you need, when you need it.  Your next appointment:   1 year(s)  Provider:   Oneil Parchment, MD    We recommend signing up for the patient portal called MyChart.  Sign up information is provided on this After Visit Summary.  MyChart is used to connect with patients for Virtual Visits (Telemedicine).  Patients are able to view lab/test results, encounter notes, upcoming appointments, etc.  Non-urgent messages can be sent to your provider as well.   To learn more about what you can do with MyChart, go to [email or web address].

## 2024-01-25 NOTE — Telephone Encounter (Signed)
 Last Lipid Panel with some out of Range done on 01/08/24
